# Patient Record
Sex: Male | Born: 1945 | Race: White | Hispanic: No | Smoking: Never smoker
Health system: Southern US, Community
[De-identification: ages and names within clinical notes are randomized; demographics above are authoritative.]

## PROBLEM LIST (undated history)

## (undated) DIAGNOSIS — M545 Low back pain, unspecified: Secondary | ICD-10-CM

## (undated) DIAGNOSIS — I350 Nonrheumatic aortic (valve) stenosis: Secondary | ICD-10-CM

## (undated) DIAGNOSIS — E119 Type 2 diabetes mellitus without complications: Secondary | ICD-10-CM

## (undated) DIAGNOSIS — Z9861 Coronary angioplasty status: Secondary | ICD-10-CM

## (undated) DIAGNOSIS — I10 Essential (primary) hypertension: Secondary | ICD-10-CM

## (undated) DIAGNOSIS — G8929 Other chronic pain: Secondary | ICD-10-CM

## (undated) DIAGNOSIS — M1990 Unspecified osteoarthritis, unspecified site: Secondary | ICD-10-CM

## (undated) DIAGNOSIS — I340 Nonrheumatic mitral (valve) insufficiency: Secondary | ICD-10-CM

## (undated) DIAGNOSIS — J61 Pneumoconiosis due to asbestos and other mineral fibers: Secondary | ICD-10-CM

## (undated) DIAGNOSIS — I219 Acute myocardial infarction, unspecified: Secondary | ICD-10-CM

## (undated) DIAGNOSIS — E7800 Pure hypercholesterolemia, unspecified: Secondary | ICD-10-CM

## (undated) DIAGNOSIS — R011 Cardiac murmur, unspecified: Secondary | ICD-10-CM

## (undated) DIAGNOSIS — I2510 Atherosclerotic heart disease of native coronary artery without angina pectoris: Secondary | ICD-10-CM

## (undated) DIAGNOSIS — F419 Anxiety disorder, unspecified: Secondary | ICD-10-CM

## (undated) DIAGNOSIS — J45909 Unspecified asthma, uncomplicated: Secondary | ICD-10-CM

## (undated) DIAGNOSIS — C61 Malignant neoplasm of prostate: Secondary | ICD-10-CM

## (undated) HISTORY — PX: PROSTATE BIOPSY: SHX241

## (undated) HISTORY — PX: LAPAROSCOPIC CHOLECYSTECTOMY: SUR755

## (undated) HISTORY — PX: ORIF SHOULDER FRACTURE: SHX5035

## (undated) HISTORY — PX: BACK SURGERY: SHX140

## (undated) HISTORY — PX: HEMORRHOID BANDING: SHX5850

## (undated) HISTORY — PX: FOREARM FRACTURE SURGERY: SHX649

## (undated) HISTORY — PX: POSTERIOR FUSION LUMBAR SPINE: SUR632

## (undated) HISTORY — PX: FRACTURE SURGERY: SHX138

---

## 2005-12-23 HISTORY — PX: CORONARY ARTERY BYPASS GRAFT: SHX141

## 2018-03-28 DIAGNOSIS — I2510 Atherosclerotic heart disease of native coronary artery without angina pectoris: Secondary | ICD-10-CM

## 2018-03-28 DIAGNOSIS — R079 Chest pain, unspecified: Secondary | ICD-10-CM | POA: Diagnosis not present

## 2018-03-28 DIAGNOSIS — I249 Acute ischemic heart disease, unspecified: Secondary | ICD-10-CM

## 2018-03-29 DIAGNOSIS — R079 Chest pain, unspecified: Secondary | ICD-10-CM | POA: Diagnosis not present

## 2018-03-29 DIAGNOSIS — I249 Acute ischemic heart disease, unspecified: Secondary | ICD-10-CM | POA: Diagnosis not present

## 2018-03-29 DIAGNOSIS — I2510 Atherosclerotic heart disease of native coronary artery without angina pectoris: Secondary | ICD-10-CM | POA: Diagnosis not present

## 2018-03-30 ENCOUNTER — Inpatient Hospital Stay
Admission: AD | Admit: 2018-03-30 | Payer: Self-pay | Source: Other Acute Inpatient Hospital | Admitting: Cardiovascular Disease

## 2018-03-30 DIAGNOSIS — R079 Chest pain, unspecified: Secondary | ICD-10-CM | POA: Diagnosis not present

## 2018-03-30 DIAGNOSIS — I2510 Atherosclerotic heart disease of native coronary artery without angina pectoris: Secondary | ICD-10-CM | POA: Diagnosis not present

## 2018-03-30 DIAGNOSIS — I249 Acute ischemic heart disease, unspecified: Secondary | ICD-10-CM | POA: Diagnosis not present

## 2018-03-31 ENCOUNTER — Encounter (HOSPITAL_COMMUNITY): Admission: AD | Payer: Self-pay | Source: Ambulatory Visit | Attending: Cardiovascular Disease

## 2018-03-31 ENCOUNTER — Inpatient Hospital Stay (HOSPITAL_COMMUNITY)
Admission: AD | Admit: 2018-03-31 | Discharge: 2018-04-01 | DRG: 247 | Payer: Medicare Other | Source: Ambulatory Visit | Attending: Cardiovascular Disease | Admitting: Cardiovascular Disease

## 2018-03-31 ENCOUNTER — Other Ambulatory Visit: Payer: Self-pay

## 2018-03-31 ENCOUNTER — Encounter (HOSPITAL_COMMUNITY): Payer: Self-pay | Admitting: Cardiology

## 2018-03-31 DIAGNOSIS — I25110 Atherosclerotic heart disease of native coronary artery with unstable angina pectoris: Principal | ICD-10-CM

## 2018-03-31 DIAGNOSIS — I2510 Atherosclerotic heart disease of native coronary artery without angina pectoris: Secondary | ICD-10-CM

## 2018-03-31 DIAGNOSIS — Z8249 Family history of ischemic heart disease and other diseases of the circulatory system: Secondary | ICD-10-CM

## 2018-03-31 DIAGNOSIS — Z951 Presence of aortocoronary bypass graft: Secondary | ICD-10-CM

## 2018-03-31 DIAGNOSIS — D649 Anemia, unspecified: Secondary | ICD-10-CM | POA: Diagnosis present

## 2018-03-31 DIAGNOSIS — Z9861 Coronary angioplasty status: Secondary | ICD-10-CM

## 2018-03-31 DIAGNOSIS — I200 Unstable angina: Secondary | ICD-10-CM | POA: Diagnosis not present

## 2018-03-31 DIAGNOSIS — R9439 Abnormal result of other cardiovascular function study: Secondary | ICD-10-CM

## 2018-03-31 DIAGNOSIS — E785 Hyperlipidemia, unspecified: Secondary | ICD-10-CM | POA: Diagnosis present

## 2018-03-31 DIAGNOSIS — Z923 Personal history of irradiation: Secondary | ICD-10-CM | POA: Diagnosis not present

## 2018-03-31 DIAGNOSIS — C61 Malignant neoplasm of prostate: Secondary | ICD-10-CM | POA: Diagnosis present

## 2018-03-31 DIAGNOSIS — Z8546 Personal history of malignant neoplasm of prostate: Secondary | ICD-10-CM

## 2018-03-31 DIAGNOSIS — E119 Type 2 diabetes mellitus without complications: Secondary | ICD-10-CM | POA: Diagnosis present

## 2018-03-31 DIAGNOSIS — Z955 Presence of coronary angioplasty implant and graft: Secondary | ICD-10-CM | POA: Diagnosis not present

## 2018-03-31 HISTORY — DX: Coronary angioplasty status: Z98.61

## 2018-03-31 HISTORY — DX: Cardiac murmur, unspecified: R01.1

## 2018-03-31 HISTORY — DX: Unspecified osteoarthritis, unspecified site: M19.90

## 2018-03-31 HISTORY — PX: LEFT HEART CATH AND CORONARY ANGIOGRAPHY: CATH118249

## 2018-03-31 HISTORY — DX: Low back pain, unspecified: M54.50

## 2018-03-31 HISTORY — DX: Other chronic pain: G89.29

## 2018-03-31 HISTORY — DX: Pure hypercholesterolemia, unspecified: E78.00

## 2018-03-31 HISTORY — DX: Pneumoconiosis due to asbestos and other mineral fibers: J61

## 2018-03-31 HISTORY — DX: Essential (primary) hypertension: I10

## 2018-03-31 HISTORY — DX: Anxiety disorder, unspecified: F41.9

## 2018-03-31 HISTORY — DX: Low back pain: M54.5

## 2018-03-31 HISTORY — DX: Atherosclerotic heart disease of native coronary artery without angina pectoris: I25.10

## 2018-03-31 HISTORY — PX: CORONARY STENT INTERVENTION: CATH118234

## 2018-03-31 HISTORY — DX: Acute myocardial infarction, unspecified: I21.9

## 2018-03-31 HISTORY — DX: Unspecified asthma, uncomplicated: J45.909

## 2018-03-31 HISTORY — DX: Malignant neoplasm of prostate: C61

## 2018-03-31 HISTORY — DX: Type 2 diabetes mellitus without complications: E11.9

## 2018-03-31 HISTORY — PX: CORONARY ANGIOPLASTY WITH STENT PLACEMENT: SHX49

## 2018-03-31 LAB — GLUCOSE, CAPILLARY
GLUCOSE-CAPILLARY: 230 mg/dL — AB (ref 65–99)
GLUCOSE-CAPILLARY: 182 mg/dL — AB (ref 65–99)
GLUCOSE-CAPILLARY: 318 mg/dL — AB (ref 65–99)

## 2018-03-31 LAB — TROPONIN I

## 2018-03-31 LAB — MRSA PCR SCREENING: MRSA by PCR: NEGATIVE

## 2018-03-31 LAB — POCT ACTIVATED CLOTTING TIME: Activated Clotting Time: 362 seconds

## 2018-03-31 SURGERY — LEFT HEART CATH AND CORONARY ANGIOGRAPHY
Anesthesia: LOCAL

## 2018-03-31 MED ORDER — NITROGLYCERIN 1 MG/10 ML FOR IR/CATH LAB
INTRA_ARTERIAL | Status: DC | PRN
  Administered 2018-03-31 (×2): 200 ug via INTRACORONARY
  Administered 2018-03-31: 100 ug via INTRACORONARY

## 2018-03-31 MED ORDER — MIDAZOLAM HCL 2 MG/2ML IJ SOLN
INTRAMUSCULAR | Status: DC | PRN
  Administered 2018-03-31 (×2): 1 mg via INTRAVENOUS
  Administered 2018-03-31: 2 mg via INTRAVENOUS

## 2018-03-31 MED ORDER — BIVALIRUDIN TRIFLUOROACETATE 250 MG IV SOLR
INTRAVENOUS | Status: AC
  Filled 2018-03-31: qty 250

## 2018-03-31 MED ORDER — SODIUM CHLORIDE 0.9% FLUSH: 3.0000 mL | Freq: Two times a day (BID) | INTRAVENOUS | Status: DC

## 2018-03-31 MED ORDER — BIVALIRUDIN BOLUS VIA INFUSION - CUPID
INTRAVENOUS | Status: DC | PRN
  Administered 2018-03-31: 66.675 mg via INTRAVENOUS

## 2018-03-31 MED ORDER — DIAZEPAM 5 MG PO TABS: 5.0000 mg | ORAL_TABLET | Freq: Four times a day (QID) | ORAL | Status: DC | PRN

## 2018-03-31 MED ORDER — SODIUM CHLORIDE 0.9 % IV SOLN
INTRAVENOUS | Status: AC | PRN
  Administered 2018-03-31: 125 mL/h via INTRAVENOUS

## 2018-03-31 MED ORDER — IOPAMIDOL (ISOVUE-370) INJECTION 76%
INTRAVENOUS | Status: DC | PRN
  Administered 2018-03-31: 125 mL via INTRA_ARTERIAL

## 2018-03-31 MED ORDER — FENTANYL CITRATE (PF) 100 MCG/2ML IJ SOLN
INTRAMUSCULAR | Status: AC
  Filled 2018-03-31: qty 2

## 2018-03-31 MED ORDER — ATORVASTATIN CALCIUM 80 MG PO TABS
80.0000 mg | ORAL_TABLET | Freq: Every day | ORAL | Status: DC
  Administered 2018-03-31: 19:00:00 80 mg via ORAL
  Filled 2018-03-31: qty 1

## 2018-03-31 MED ORDER — MIDAZOLAM HCL 2 MG/2ML IJ SOLN
INTRAMUSCULAR | Status: AC
  Filled 2018-03-31: qty 2

## 2018-03-31 MED ORDER — IOPAMIDOL (ISOVUE-370) INJECTION 76%
INTRAVENOUS | Status: AC
  Filled 2018-03-31: qty 125

## 2018-03-31 MED ORDER — TICAGRELOR 90 MG PO TABS
ORAL_TABLET | ORAL | Status: DC | PRN
  Administered 2018-03-31: 180 mg via ORAL

## 2018-03-31 MED ORDER — HEPARIN (PORCINE) IN NACL 2-0.9 UNIT/ML-% IJ SOLN
INTRAMUSCULAR | Status: AC
  Filled 2018-03-31: qty 1500

## 2018-03-31 MED ORDER — HYDRALAZINE HCL 20 MG/ML IJ SOLN: 5.0000 mg | INTRAMUSCULAR | Status: AC | PRN

## 2018-03-31 MED ORDER — IOHEXOL 350 MG/ML SOLN
INTRAVENOUS | Status: DC | PRN
  Administered 2018-03-31: 180 mL via INTRA_ARTERIAL

## 2018-03-31 MED ORDER — SODIUM CHLORIDE 0.9 % IV SOLN
INTRAVENOUS | Status: DC
  Administered 2018-03-31 (×2): via INTRAVENOUS

## 2018-03-31 MED ORDER — SODIUM CHLORIDE 0.9 % IV SOLN
INTRAVENOUS | Status: AC | PRN
  Administered 2018-03-31 (×2): 1.75 mg/kg/h via INTRAVENOUS

## 2018-03-31 MED ORDER — FENTANYL CITRATE (PF) 100 MCG/2ML IJ SOLN
50.0000 ug | Freq: Once | INTRAMUSCULAR | Status: AC
  Administered 2018-03-31: 50 ug via INTRAVENOUS

## 2018-03-31 MED ORDER — ONDANSETRON HCL 4 MG/2ML IJ SOLN: 4.0000 mg | Freq: Four times a day (QID) | INTRAMUSCULAR | Status: DC | PRN

## 2018-03-31 MED ORDER — LIDOCAINE HCL (PF) 1 % IJ SOLN
INTRAMUSCULAR | Status: DC | PRN
  Administered 2018-03-31: 20 mL

## 2018-03-31 MED ORDER — ASPIRIN 81 MG PO CHEW
81.0000 mg | CHEWABLE_TABLET | Freq: Every day | ORAL | Status: DC
  Administered 2018-03-31 – 2018-04-01 (×2): 81 mg via ORAL
  Filled 2018-03-31 (×2): qty 1

## 2018-03-31 MED ORDER — ZOLPIDEM TARTRATE 5 MG PO TABS: 5.0000 mg | ORAL_TABLET | Freq: Every evening | ORAL | Status: DC | PRN

## 2018-03-31 MED ORDER — ISOSORBIDE MONONITRATE ER 30 MG PO TB24
30.0000 mg | ORAL_TABLET | Freq: Every day | ORAL | Status: DC
  Administered 2018-03-31 – 2018-04-01 (×2): 30 mg via ORAL
  Filled 2018-03-31 (×2): qty 1

## 2018-03-31 MED ORDER — HEPARIN (PORCINE) IN NACL 2-0.9 UNIT/ML-% IJ SOLN
INTRAMUSCULAR | Status: AC | PRN
  Administered 2018-03-31 (×2): 500 mL via INTRA_ARTERIAL

## 2018-03-31 MED ORDER — TICAGRELOR 90 MG PO TABS
ORAL_TABLET | ORAL | Status: AC
  Filled 2018-03-31: qty 2

## 2018-03-31 MED ORDER — SODIUM CHLORIDE 0.9 % IV SOLN: 250.0000 mL | INTRAVENOUS | Status: DC | PRN

## 2018-03-31 MED ORDER — ACETAMINOPHEN 325 MG PO TABS
650.0000 mg | ORAL_TABLET | ORAL | Status: DC | PRN
  Administered 2018-03-31: 23:00:00 650 mg via ORAL
  Filled 2018-03-31: qty 2

## 2018-03-31 MED ORDER — LABETALOL HCL 5 MG/ML IV SOLN: 10.0000 mg | INTRAVENOUS | Status: AC | PRN

## 2018-03-31 MED ORDER — METOPROLOL TARTRATE 12.5 MG HALF TABLET
12.5000 mg | ORAL_TABLET | Freq: Two times a day (BID) | ORAL | Status: DC
  Administered 2018-03-31 – 2018-04-01 (×2): 12.5 mg via ORAL
  Filled 2018-03-31 (×2): qty 1

## 2018-03-31 MED ORDER — SODIUM CHLORIDE 0.9% FLUSH: 3.0000 mL | INTRAVENOUS | Status: DC | PRN

## 2018-03-31 MED ORDER — NITROGLYCERIN 1 MG/10 ML FOR IR/CATH LAB
INTRA_ARTERIAL | Status: AC
  Filled 2018-03-31: qty 10

## 2018-03-31 MED ORDER — LIDOCAINE HCL (PF) 1 % IJ SOLN
INTRAMUSCULAR | Status: AC
  Filled 2018-03-31: qty 30

## 2018-03-31 MED ORDER — FENTANYL CITRATE (PF) 100 MCG/2ML IJ SOLN
INTRAMUSCULAR | Status: DC | PRN
  Administered 2018-03-31 (×3): 25 ug via INTRAVENOUS

## 2018-03-31 MED ORDER — TICAGRELOR 90 MG PO TABS
90.0000 mg | ORAL_TABLET | Freq: Two times a day (BID) | ORAL | Status: DC
  Administered 2018-03-31 – 2018-04-01 (×2): 90 mg via ORAL
  Filled 2018-03-31 (×2): qty 1

## 2018-03-31 SURGICAL SUPPLY — 25 items
BALLN SAPPHIRE 3.0X12 (BALLOONS) ×2
BALLN SAPPHIRE ~~LOC~~ 3.5X15 (BALLOONS) ×2 IMPLANT
BALLN WOLVERINE 2.00X10 (BALLOONS) ×2
BALLN WOLVERINE 2.00X6 (BALLOONS) ×2
BALLN WOLVERINE 2.50X6 (BALLOONS) ×2
BALLN ~~LOC~~ EMERGE MR 4.0X8 (BALLOONS) ×2
BALLOON SAPPHIRE 3.0X12 (BALLOONS) ×1 IMPLANT
BALLOON WOLVERINE 2.00X10 (BALLOONS) ×1 IMPLANT
BALLOON WOLVERINE 2.00X6 (BALLOONS) ×1 IMPLANT
BALLOON WOLVERINE 2.50X6 (BALLOONS) ×1 IMPLANT
BALLOON ~~LOC~~ EMERGE MR 4.0X8 (BALLOONS) ×1 IMPLANT
CATH INFINITI 5FR MULTPACK ANG (CATHETERS) ×2 IMPLANT
CATH VISTA GUIDE 6FR XB4 (CATHETERS) ×2 IMPLANT
ELECT DEFIB PAD ADLT CADENCE (PAD) ×2 IMPLANT
KIT ENCORE 26 ADVANTAGE (KITS) ×2 IMPLANT
KIT HEART LEFT (KITS) ×2 IMPLANT
PACK CARDIAC CATHETERIZATION (CUSTOM PROCEDURE TRAY) ×2 IMPLANT
SHEATH AVANTI 11CM 5FR (SHEATH) ×2 IMPLANT
SHEATH AVANTI 11CM 6FR (SHEATH) ×2 IMPLANT
STENT RESOLUTE ONYX 3.0X30 (Permanent Stent) ×2 IMPLANT
SYR MEDRAD MARK V 150ML (SYRINGE) ×2 IMPLANT
TRANSDUCER W/STOPCOCK (MISCELLANEOUS) ×2 IMPLANT
TUBING CIL FLEX 10 FLL-RA (TUBING) ×2 IMPLANT
WIRE EMERALD 3MM-J .035X150CM (WIRE) ×2 IMPLANT
WIRE PT2 MS 185 (WIRE) ×2 IMPLANT

## 2018-03-31 NOTE — Progress Notes (Signed)
46F sheath removed by Burlene Arnt RTR.  Sheath removed from right femoral artery intact. 20 min manual pressure applied Site level 0.  Dressed with 4x4 and Tegaderm dressing applied.  Post instructions given.  Bed rest begins at 16:15 hrs.

## 2018-03-31 NOTE — H&P (Addendum)
Cardiology Admission History and Physical:   Patient ID: Miguel Garcia; MRN: [id number]; DOB: Feb 01, 1946   Admission date: 03/31/2018  Primary Care Provider: Patient, No Pcp Per Primary Cardiologist: New (Dr. Claiborne Billings)  Chief Complaint:  Unstable Angina and Abnormal Stress Test  Patient Profile:   Miguel Garcia is a 72 y.o. male prisoner, with a history of CAD s/p remote coronary stenting and CABG 14 years ago, transferred from [facility] for unstable angina and abnormal NST.   History of Present Illness:   [name] has been incarcerated for 11 years. Prior to incarceration, he was diagnosed with CAD and required stenting. Shortly after PCI, he had stent occlusion and required CABG (grafts unknown). Since CABG, he required additional stents. While incarcerated, he has been seen by cardiologists in [facility].  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 Pain improved with SL NTG but returned, prompting pt to go to [facility] for further evaluation. Cardiac enzymes were cycled but negative. NST was performed and abnormal showing anterior ischemia. Subsequently, pt was transferred to [facility] for definitive LHC. He is currently CP free. No dyspnea.    Past Medical History:  Diagnosis Date  . CAD (coronary artery disease)   . Diabetes ([place])   . Hx of CABG      The histories are not reviewed yet. Please review them in the "History" navigator section and refresh this [place].   Medications Prior to Admission: Prior to Admission medications   Not on File     Allergies:   Allergies not on file  Social History:   Social History   Socioeconomic History  . Marital status: Divorced    Spouse name: Not on file  . Number of children: Not on file  . Years of education: Not on file  . Highest education level: Not on file  Occupational History  . Not on file  Social Needs  . Financial resource strain: Not on file  . Food insecurity:    Worry: Not on file      Inability: Not on file  . Transportation needs:    Medical: Not on file    Non-medical: Not on file  Tobacco Use  . Smoking status: Not on file  Substance and Sexual Activity  . Alcohol use: Not on file  . Drug use: Not on file  . Sexual activity: Not on file  Lifestyle  . Physical activity:    Days per week: Not on file    Minutes per session: Not on file  . Stress: Not on file  Relationships  . Social connections:    Talks on phone: Not on file    Gets together: Not on file    Attends religious service: Not on file    Active member of club or organization: Not on file    Attends meetings of clubs or organizations: Not on file    Relationship status: Not on file  . Intimate partner violence:    Fear of current or ex partner: Not on file    Emotionally abused: Not on file    Physically abused: Not on file    Forced sexual activity: Not on file  Other Topics Concern  . Not on file  Social History Narrative  . Not on file    Family History:   The patient's family history includes Hypertension in his mother.    ROS:  Please see the history of present illness.  All other ROS reviewed and negative.  Physical Exam/Data:  There were no vitals filed for this visit. No intake or output data in the 24 hours ending 03/31/18 1025 There were no vitals filed for this visit. There is no height or weight on file to calculate BMI.  General:  Well nourished, well developed, in no acute distress HEENT: normal Lymph: no adenopathy Neck: no JVD Endocrine:  No thryomegaly Vascular: No carotid bruits; FA pulses 2+ bilaterally without bruits  Cardiac:  normal S1, S2; RRR; 1/6 murmur Lungs:  clear to auscultation bilaterally, no wheezing, rhonchi or rales  Abd: soft, nontender, no hepatomegaly  Ext: noedema Musculoskeletal:  No deformities, BUE and BLE strength normal and equal Skin: warm and dry  Neuro:  CNs 2-12 intact, no focal abnormalities noted Psych:  Normal affect     EKG:  The ECG that was done at outside hospital was personally reviewed and demonstrates NSR with inferolateral ST abnormalities   Relevant CV Studies: [facility] 03/30/18 pending   Laboratory Data:  ChemistryNo results for input(s): NA, K, CL, CO2, GLUCOSE, BUN, CREATININE, CALCIUM, GFRNONAA, GFRAA, ANIONGAP in the last 168 hours.  No results for input(s): PROT, ALBUMIN, AST, ALT, ALKPHOS, BILITOT in the last 168 hours. HematologyNo results for input(s): WBC, RBC, HGB, HCT, MCV, MCH, MCHC, RDW, PLT in the last 168 hours. Cardiac EnzymesNo results for input(s): TROPONINI in the last 168 hours. No results for input(s): TROPIPOC in the last 168 hours.  BNPNo results for input(s): BNP, PROBNP in the last 168 hours.  DDimer No results for input(s): DDIMER in the last 168 hours.  Radiology/Studies:  No results found.  Assessment and Plan:   1. Unstable Angina: established h/o CAD s/p remote CABG at [facility] 14 years ago. Cardiac enzymes negative at outside hospital but NST abnormal with anterior ischemia. Currently CP free. Plan on definitive LHC +/- PCI. I have reviewed the risks, indications, and alternatives to cardiac catheterization and possible angioplasty/stenting with the patient. Risks include but are not limited to bleeding, infection, vascular injury, stroke, myocardial infection, arrhythmia, kidney injury, radiation-related injury in the case of prolonged fluoroscopy use, emergency cardiac surgery, and death. The patient understands the risks of serious complication is low (<2%).    Severity of Illness: The appropriate patient status for this patient is INPATIENT. Inpatient status is judged to be reasonable and necessary in order to provide the required intensity of service to ensure the patient's safety. The patient's presenting symptoms, physical exam findings, and initial radiographic and laboratory data in the context of their chronic comorbidities is felt to place them at  high risk for further clinical deterioration. Furthermore, it is not anticipated that the patient will be medically stable for discharge from the hospital within 2 midnights of admission. The following factors support the patient status of inpatient.   " The patient's presenting symptoms include unstable angina. " The worrisome physical exam findings include 1/6 murmur. " The initial radiographic and laboratory data are worrisome because of abnormal stress test. " The chronic co-morbidities include CAD and diabetes.   * I certify that at the point of admission it is my clinical judgment that the patient will require inpatient hospital care spanning beyond 2 midnights from the point of admission due to high intensity of service, high risk for further deterioration and high frequency of surveillance required.*    For questions or updates, please contact [place] Please consult [email or web address] for contact info under Cardiology/STEMI.    Signed, Lyda Jester, PA-C  03/31/2018 10:25 AM  Patient seen and examined. Agree with assessment and plan.  Mr. Zai Chmiel is a 72 year old gentleman who is currently residing at [facility].  He has been incarcerated for 11 years.  He has a history of CAD and underwent CABG revascularization surgery at [facility] approximate 14 years ago.  I do not know the specifics of his grafts.  However, 46 months prior to undergoing surgery stents were placed, which apparently had occluded.  He states he has had several additional stents placed since.  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  The chest pain persisted and ultimately required 4 nitroglycerin for resolution.  He was evaluated at [facility].  Troponins are negative for MI.  A nuclear perfusion study is shown reversible anterior ischemia.  He was transferred today for definitive cardiac catheterization.   Patient has a history of prostate CA and has received radiation treatment.  I have reviewed the risks, indications, and alternatives to cardiac catheterization, possible angioplasty, and stenting with the patient. Risks include but are not limited to bleeding, infection, vascular injury, stroke, myocardial infection, arrhythmia, kidney injury, radiation-related injury in the case of prolonged fluoroscopy use, emergency cardiac surgery, and death. The patient understands the risks of serious complication is 1-2 in 4585 with diagnostic cardiac cath and 1-2% or less with angioplasty/stenting.  Presently the patient is pain-free.  Plan catheterization and possible coronary intervention.   Troy Sine, MD, [facility] 03/31/2018 10:27 AM

## 2018-03-31 NOTE — Interval H&P Note (Signed)
Cath Lab Visit (complete for each Cath Lab visit)  Clinical Evaluation Leading to the Procedure:   ACS: No.  Non-ACS:    Anginal Classification: CCS III  Anti-ischemic medical therapy: Maximal Therapy (2 or more classes of medications)  Non-Invasive Test Results: High-risk stress test findings: cardiac mortality >3%/year  Prior CABG: Previous CABG      History and Physical Interval Note:  03/31/2018 10:31 AM  Miguel Garcia  has presented today for surgery, with the diagnosis of cp  The various methods of treatment have been discussed with the patient and family. After consideration of risks, benefits and other options for treatment, the patient has consented to  Procedure(s): LEFT HEART CATH AND CORONARY ANGIOGRAPHY (N/A) as a surgical intervention .  The patient's history has been reviewed, patient examined, no change in status, stable for surgery.  I have reviewed the patient's chart and labs.  Questions were answered to the patient's satisfaction.     Shelva Majestic

## 2018-04-01 ENCOUNTER — Encounter (HOSPITAL_COMMUNITY): Payer: Self-pay | Admitting: Cardiovascular Disease

## 2018-04-01 DIAGNOSIS — I2510 Atherosclerotic heart disease of native coronary artery without angina pectoris: Secondary | ICD-10-CM

## 2018-04-01 DIAGNOSIS — E785 Hyperlipidemia, unspecified: Secondary | ICD-10-CM | POA: Diagnosis present

## 2018-04-01 DIAGNOSIS — I200 Unstable angina: Secondary | ICD-10-CM

## 2018-04-01 DIAGNOSIS — Z951 Presence of aortocoronary bypass graft: Secondary | ICD-10-CM

## 2018-04-01 DIAGNOSIS — D649 Anemia, unspecified: Secondary | ICD-10-CM | POA: Diagnosis present

## 2018-04-01 DIAGNOSIS — Z9861 Coronary angioplasty status: Secondary | ICD-10-CM

## 2018-04-01 DIAGNOSIS — Z8546 Personal history of malignant neoplasm of prostate: Secondary | ICD-10-CM

## 2018-04-01 LAB — BASIC METABOLIC PANEL
Anion gap: 9 (ref 5–15)
BUN: 24 mg/dL — ABNORMAL HIGH (ref 6–20)
CHLORIDE: 101 mmol/L (ref 101–111)
CO2: 23 mmol/L (ref 22–32)
CREATININE: 1.06 mg/dL (ref 0.61–1.24)
Calcium: 8.9 mg/dL (ref 8.9–10.3)
GFR calc non Af Amer: >60 mL/min (ref >60)
Glucose, Bld: 234 mg/dL — ABNORMAL HIGH (ref 65–99)
POTASSIUM: 3.6 mmol/L (ref 3.5–5.1)
Sodium: 133 mmol/L — ABNORMAL LOW (ref 135–145)

## 2018-04-01 LAB — HEPATIC FUNCTION PANEL
ALK PHOS: 62 U/L (ref 38–126)
ALT: 28 U/L (ref 17–63)
AST: 30 U/L (ref 15–41)
Albumin: 3 g/dL — ABNORMAL LOW (ref 3.5–5.0)
BILIRUBIN DIRECT: 0.1 mg/dL (ref 0.1–0.5)
BILIRUBIN INDIRECT: 0.4 mg/dL (ref 0.3–0.9)
BILIRUBIN TOTAL: 0.5 mg/dL (ref 0.3–1.2)
Total Protein: 5.6 g/dL — ABNORMAL LOW (ref 6.5–8.1)

## 2018-04-01 LAB — GLUCOSE, CAPILLARY
Glucose-Capillary: 236 mg/dL — ABNORMAL HIGH (ref 65–99)
Glucose-Capillary: 246 mg/dL — ABNORMAL HIGH (ref 65–99)

## 2018-04-01 LAB — CBC
HEMATOCRIT: 27.3 % — AB (ref 39.0–52.0)
Hemoglobin: 9.1 g/dL — ABNORMAL LOW (ref 13.0–17.0)
MCH: 33 pg (ref 26.0–34.0)
MCHC: 33.3 g/dL (ref 30.0–36.0)
MCV: 98.9 fL (ref 78.0–100.0)
PLATELETS: 163 10*3/uL (ref 150–400)
RBC: 2.76 MIL/uL — AB (ref 4.22–5.81)
RDW: 15.3 % (ref 11.5–15.5)
WBC: 5 10*3/uL (ref 4.0–10.5)

## 2018-04-01 MED ORDER — ANGIOPLASTY BOOK
Freq: Once | Status: AC
  Administered 2018-04-01: 04:00:00
  Filled 2018-04-01: qty 1

## 2018-04-01 MED ORDER — TICAGRELOR 90 MG PO TABS: 90.0000 mg | ORAL_TABLET | Freq: Two times a day (BID) | ORAL | 11 refills | Status: DC

## 2018-04-01 MED ORDER — INSULIN ASPART 100 UNIT/ML ~~LOC~~ SOLN
0.0000 [IU] | Freq: Three times a day (TID) | SUBCUTANEOUS | Status: DC
  Administered 2018-04-01: 5 [IU] via SUBCUTANEOUS

## 2018-04-01 MED ORDER — NITROGLYCERIN 0.4 MG SL SUBL: 0.4000 mg | SUBLINGUAL_TABLET | SUBLINGUAL | Status: DC | PRN

## 2018-04-01 MED ORDER — ACETAMINOPHEN 325 MG PO TABS
650.0000 mg | ORAL_TABLET | ORAL | Status: DC | PRN
Start: 2018-04-01 — End: 2018-08-19

## 2018-04-01 MED FILL — Heparin Sodium (Porcine) 2 Unit/ML in Sodium Chloride 0.9%: INTRAMUSCULAR | Qty: 1000 | Status: AC

## 2018-04-01 NOTE — Progress Notes (Signed)
CARDIAC REHAB PHASE I   PRE:  Rate/Rhythm: 79 SR    BP: sitting 169/97    SaO2:   MODE:  Ambulation: 500 ft   POST:  Rate/Rhythm: 90 SR    BP: sitting 188/76     SaO2:   Pt denied angina however was fatigued after walking 500 ft. Apparently he has not been able to do as much lately with CAD and also prostate ca tx. BP elevated but just got meds. Discussed ed with pt and guard. Apparently there have been instances in the past that the prison MD has not given him his antiplatelet at their discretion. Reiterated to pt and guard that he has to take Brilinta twice a day. Discussed situation with PA and CM to help support him getting his Brilinta, atleast for 30 day (then switch to Plavix if unable to get Brilinta longer). Reviewed diet and ex and NTG. Pt unable to do CRPII due to being in the prison system.  [place], ACSM 04/01/2018 9:37 AM

## 2018-04-01 NOTE — Progress Notes (Addendum)
Progress Note  Patient Name: Miguel Garcia Date of Encounter: 04/01/2018  Primary Cardiologist: Claiborne Billings   Subjective   No chest pain  Inpatient Medications    Scheduled Meds: . aspirin  81 mg Oral Daily  . atorvastatin  80 mg Oral q1800  . insulin aspart  0-15 Units Subcutaneous TID WC  . isosorbide mononitrate  30 mg Oral Daily  . metoprolol tartrate  12.5 mg Oral BID  . sodium chloride flush  3 mL Intravenous Q12H  . ticagrelor  90 mg Oral BID   Continuous Infusions: . sodium chloride 100 mL/hr at 04/01/18 0642  . sodium chloride     PRN Meds: sodium chloride, acetaminophen, diazepam, ondansetron (ZOFRAN) IV, sodium chloride flush, zolpidem   Vital Signs    Vitals:   03/31/18 1956 03/31/18 2000 04/01/18 0346 04/01/18 0752  BP: 132/63 133/75 (!) 161/63 (!) 138/58  Pulse: 85  73 79  Resp: 15 16 17 20   Temp: 98.3 F (36.8 C)  97.7 F (36.5 C) 98.4 F (36.9 C)  TempSrc: Oral  Oral Oral  SpO2: 94% 93% 97%   Weight:   196 lb 3.4 oz (89 kg)   Height:        Intake/Output Summary (Last 24 hours) at 04/01/2018 0857 Last data filed at 04/01/2018 0816 Gross per 24 hour  Intake 3079.08 ml  Output 1880 ml  Net 1199.08 ml   Filed Weights   03/31/18 1000 04/01/18 0346  Weight: 196 lb (88.9 kg) 196 lb 3.4 oz (89 kg)    Telemetry    NSR, PVCs - Personally Reviewed  ECG    NSR, inferior Qs - Personally Reviewed  Physical Exam   GEN: No acute distress.   Neck: No JVD Cardiac: RRR, no murmurs, rubs, or gallops.  Respiratory: Clear to auscultation bilaterally. GI: Soft, nontender, non-distended  MS: No edema; No deformity. RFA without hematoma Neuro:  Nonfocal  Psych: Normal affect   Labs    Chemistry Recent Labs  Lab 04/01/18 0316  NA 133*  K 3.6  CL 101  CO2 23  GLUCOSE 234*  BUN 24*  CREATININE 1.06  CALCIUM 8.9  PROT 5.6*  ALBUMIN 3.0*  AST 30  ALT 28  ALKPHOS 62  BILITOT 0.5  GFRNONAA >60  GFRAA >60  ANIONGAP 9      Hematology Recent Labs  Lab 04/01/18 0316  WBC 5.0  RBC 2.76*  HGB 9.1*  HCT 27.3*  MCV 98.9  MCH 33.0  MCHC 33.3  RDW 15.3  PLT 163    Cardiac Enzymes Recent Labs  Lab 03/31/18 1836  TROPONINI <0.03   No results for input(s): TROPIPOC in the last 168 hours.   BNPNo results for input(s): BNP, PROBNP in the last 168 hours.   DDimer No results for input(s): DDIMER in the last 168 hours.   Radiology    No results found.  Cardiac Studies   See Cath/ PCI note 03/31/18  Patient Profile     72 y.o. male inmate, h/o remote CABG, h/o recent chemo for prostate CA (finished 3 weeks ago) transferred from [facility] 03/31/18 where he presented with chest pain. Nuclear stress was abnormal and he was transferred top [facility] for cath.   Assessment & Plan    1. Unstable angina-   2. CAD- h/o CABG, LM PCI with DES this admission-pt is concerned he will not be able to get Brilinta through the prison system. I'll ask Dr Claiborne Billings if Plavix is an acceptable alternative.  3. Prostate CA- s/p recent chemo- finished 3 weeks ago  4. Anemia- I suspect this is secondary to recent chemo- his Hgb at [facility] was 10. This will need f/u as OP  Plan: Ambulate this am- possible discharge later today- he will need his Med Rec filled out by pharmacy before discharge. He'll need to f/u in the prison system.   For questions or updates, please contact [place] Please consult [email or web address] for contact info under Cardiology/STEMI.   Signed, Kerin Ransom, PA-C  04/01/2018, 8:57 AM  Attending Note:   The patient was seen and examined.  Agree with assessment and plan as noted above.  Changes made to the above note as needed.  Patient seen and independently examined with Kerin Ransom, PA .   We discussed all aspects of the encounter. I agree with the assessment and plan as stated above.  1.   ACS - s/p stenting of his protected LM.     DC today .   Either plavix or Brilinta ( Dr. Claiborne Billings to weigh in on this )  Cont  asa NTG    I have spent a total of 40 minutes with patient reviewing hospital  notes , telemetry, EKGs, labs and examining patient as well as establishing an assessment and plan that was discussed with the patient. > 50% of time was spent in direct patient care.    Thayer Headings, Brooke Bonito., MD, [facility] 04/01/2018, 9:34 AM [address],  [place] Pager [phone]

## 2018-04-01 NOTE — Discharge Summary (Addendum)
Patient ID: Miguel Garcia,  MRN: [id number], DOB/AGE: 05-08-1946 72 y.o.  Admit date: 03/31/2018 Discharge date: 04/01/2018  Primary Care Provider: Patient, No Pcp Per Primary Cardiologist:   Discharge Diagnoses Principal Problem:   Unstable angina [facility]) Active Problems:   Abnormal nuclear stress test   CAD S/P percutaneous coronary angioplasty   Hx of CABG   Dyslipidemia, goal LDL below 70   History of prostate cancer   Anemia   Incarceration   Procedures: Cath/ LM PCI 03/31/18   Hospital Course:  72 y.o. male inmate, h/o remote CABG, h/o recent chemo for prostate CA (finished 3 weeks ago), transferred from [facility] 03/31/18 where he presented 03/28/18 with chest pain c/w [place]. A Nuclear stress was abnormal there and he was transferred 03/31/18 to [facility] for cath. Cath revealed a patent LIMA-LAD with a 30% RCA, patent mCFX stent, patent mLAD stent, 100% pLAD, 95% LM, 60% pCFX. The pt underwent PCI with DES covering the LM and proximal CFX. The pt tolerated the procedure well. His EF was 50% at cath. He was seen the morning of 04/01/18 by Dr Acie Fredrickson and felt to be stable for discharge.   The pt was noted to be anemic- Hgb 9.1 post PCI. The pt recently finished chemotherapy for prostate cancer and we feel his anemia is most likely related to that. The pt denies any h/o GI bleeding or melena. We recommend a f/u CBC in a few days. The pt also expressed concern about receiving Brilinta at prison. Dr Claiborne Billings has recommended at least 30 days of Brilinta. If the pt needed to be switched to Plavix he would need a P2Y12 drawn after 2-3 weeks of Plavix therapy to document the patient was a Plavix responder.    Discharge Vitals:  Blood pressure (!) 145/63, pulse 64, temperature 98.4 F (36.9 C), temperature source Oral, resp. rate (!) 9, height 5\' 8"  (1.727 m), weight 196 lb 3.4 oz (89 kg), SpO2 91 %.    Labs: Results for orders placed or performed during the hospital encounter of 03/31/18 (from the past 24  hour(s))  POCT Activated clotting time     Status: None   Collection Time: 03/31/18 11:33 AM  Result Value Ref Range   Activated Clotting Time 362 seconds  Glucose, capillary     Status: Abnormal   Collection Time: 03/31/18  1:30 PM  Result Value Ref Range   Glucose-Capillary 230 (H) 65 - 99 mg/dL  Glucose, capillary     Status: Abnormal   Collection Time: 03/31/18  4:55 PM  Result Value Ref Range   Glucose-Capillary 182 (H) 65 - 99 mg/dL   Comment 1 Notify RN    Comment 2 Document in Chart   Troponin I (serum)     Status: None   Collection Time: 03/31/18  6:36 PM  Result Value Ref Range   Troponin I <0.03 <0.03 ng/mL  MRSA PCR Screening     Status: None   Collection Time: 03/31/18  9:09 PM  Result Value Ref Range   MRSA by PCR NEGATIVE NEGATIVE  Glucose, capillary     Status: Abnormal   Collection Time: 03/31/18  9:22 PM  Result Value Ref Range   Glucose-Capillary 318 (H) 65 - 99 mg/dL   Comment 1 Notify RN    Comment 2 Document in Chart   Basic metabolic panel     Status: Abnormal   Collection Time: 04/01/18  3:16 AM  Result Value Ref Range   Sodium 133 (  L) 135 - 145 mmol/L   Potassium 3.6 3.5 - 5.1 mmol/L   Chloride 101 101 - 111 mmol/L   CO2 23 22 - 32 mmol/L   Glucose, Bld 234 (H) 65 - 99 mg/dL   BUN 24 (H) 6 - 20 mg/dL   Creatinine, Ser 1.06 0.61 - 1.24 mg/dL   Calcium 8.9 8.9 - 10.3 mg/dL   GFR calc non Af Amer >60 >60 mL/min   GFR calc Af Amer >60 >60 mL/min   Anion gap 9 5 - 15  CBC     Status: Abnormal   Collection Time: 04/01/18  3:16 AM  Result Value Ref Range   WBC 5.0 4.0 - 10.5 K/uL   RBC 2.76 (L) 4.22 - 5.81 MIL/uL   Hemoglobin 9.1 (L) 13.0 - 17.0 g/dL   HCT 27.3 (L) 39.0 - 52.0 %   MCV 98.9 78.0 - 100.0 fL   MCH 33.0 26.0 - 34.0 pg   MCHC 33.3 30.0 - 36.0 g/dL   RDW 15.3 11.5 - 15.5 %   Platelets 163 150 - 400 K/uL  Hepatic function panel     Status: Abnormal   Collection Time: 04/01/18  3:16 AM  Result Value Ref Range   Total Protein 5.6 (L)  6.5 - 8.1 g/dL   Albumin 3.0 (L) 3.5 - 5.0 g/dL   AST 30 15 - 41 U/L   ALT 28 17 - 63 U/L   Alkaline Phosphatase 62 38 - 126 U/L   Total Bilirubin 0.5 0.3 - 1.2 mg/dL   Bilirubin, Direct 0.1 0.1 - 0.5 mg/dL   Indirect Bilirubin 0.4 0.3 - 0.9 mg/dL  Glucose, capillary     Status: Abnormal   Collection Time: 04/01/18  6:47 AM  Result Value Ref Range   Glucose-Capillary 236 (H) 65 - 99 mg/dL   Comment 1 Notify RN    Comment 2 Document in Chart     Disposition:    Discharge Medications:  Allergies as of 04/01/2018   Not on File     Medication List    TAKE these medications   acetaminophen 325 MG tablet Commonly known as:  TYLENOL Take 2 tablets (650 mg total) by mouth every 4 (four) hours as needed for headache or mild pain.   albuterol 108 (90 Base) MCG/ACT inhaler Commonly known as:  PROVENTIL HFA;VENTOLIN HFA Inhale 2 puffs into the lungs 4 (four) times daily as needed for wheezing or shortness of breath.   aspirin EC 81 MG tablet Take 81 mg by mouth daily.   bicalutamide 50 MG tablet Commonly known as:  CASODEX Take 50 mg by mouth daily.   carbamazepine 400 MG 12 hr tablet Commonly known as:  TEGRETOL XR Take 400 mg by mouth at bedtime.   chlorpheniramine 4 MG tablet Commonly known as:  CHLOR-TRIMETON Take 4 mg by mouth 4 (four) times daily as needed for allergies.   docusate sodium 100 MG capsule Commonly known as:  COLACE Take 100 mg by mouth 2 (two) times daily.   doxazosin 4 MG tablet Commonly known as:  CARDURA Take 4 mg by mouth at bedtime.   gemfibrozil 600 MG tablet Commonly known as:  LOPID Take 600 mg by mouth 2 (two) times daily before a meal.   glipiZIDE 10 MG 24 hr tablet Commonly known as:  GLUCOTROL XL Take 10 mg by mouth daily with breakfast.   isosorbide mononitrate 60 MG 24 hr tablet Commonly known as:  IMDUR Take 60 mg by  mouth daily.   metoprolol tartrate 50 MG tablet Commonly known as:  LOPRESSOR Take 50 mg by mouth daily.     nitroGLYCERIN 0.4 MG SL tablet Commonly known as:  NITROSTAT Place 0.4 mg under the tongue every 5 (five) minutes as needed for chest pain.   phenazopyridine 200 MG tablet Commonly known as:  PYRIDIUM Take 200 mg by mouth 3 (three) times daily with meals.   pioglitazone 15 MG tablet Commonly known as:  ACTOS Take 15 mg by mouth daily.   pramoxine-zinc 1-5 % rectal cream Commonly known as:  TRONOLANE Place 28 g rectally every 2 (two) hours as needed for hemorrhoids.   tamsulosin 0.4 MG Caps capsule Commonly known as:  FLOMAX Take 0.4 mg by mouth 2 (two) times daily.   ticagrelor 90 MG Tabs tablet Commonly known as:  BRILINTA Take 1 tablet (90 mg total) by mouth 2 (two) times daily.        Duration of Discharge Encounter: Greater than 30 minutes including physician time.  Angelena Form PA-C 04/01/2018 11:02 AM   Attending Note:   The patient was seen and examined.  Agree with assessment and plan as noted above.  Changes made to the above note as needed.  Patient seen and independently examined with  Kerin Ransom, PA .   We discussed all aspects of the encounter. I agree with the assessment and plan as stated above.  1.   CAD : He is status post stent placement to the protected left main.  Seems to be doing very well.  We will give him 30 days of Brilinta.Marland Kitchen  He made need to be switched to Plavix at that time.  Follow-up with his primary medical doctor he will follow-up with Dr. Claiborne Billings as needed for cardiology issues.   I have spent a total of 40 minutes with patient reviewing hospital  notes , telemetry, EKGs, labs and examining patient as well as establishing an assessment and plan that was discussed with the patient. > 50% of time was spent in direct patient care.    Thayer Headings, Brooke Bonito., MD, [facility] 04/01/2018, 2:51 PM [address]  [place] Pager [phone]

## 2018-04-01 NOTE — Progress Notes (Addendum)
Spoke w Licensed conveyancer at [facility] [phone]. Informed her that patient will be Dc'd w Brilinta and sent with coupon for a free 30 day supply. Reiterated that MD feels it is very important for him to be on it in the very least for 30 days.  Rich Rn asked CM to contact [place] at [phone] to verify that patient will return to [facility]. Per Merrily Pew, patient will return to [facility]. Josh [phone]. Faxed Dc note to Cassandria Santee RN at [phone], discussed Brilinta, and she stated she will get it from local pharmacy to ensure he gets his dose tonight. Discussed coupon card. Provided card to guard at bedside who will give to [facility].

## 2018-04-12 NOTE — Progress Notes (Signed)
Cardiology Office Note   Date:  04/13/2018   ID:  Miguel Garcia, DOB [birth year], MRN [id number]  PCP:  Patient, No Pcp Per  Cardiologist:  Dr. Claiborne Billings Chief Complaint  Patient presents with  . Follow-up    pt c/o SOB on exertion and when laying down at night; dizziness after taking meds in the mornings occasionally--lasts about an hour; leg cramps--has had for months     History of Present Illness: Miguel Garcia is a 72 y.o. male who presents for post hospitalization after being admitted for chest pain with known history of CAD s/p remote coronary stenting and CABG in 2003. He was transferred from [facility] after having abnormal stress test.   Cath revealed a patent LIMA-LAD with a 30% RCA, patent mCFX stent, patent mLAD stent, 100% pLAD, 95% LM, 60% pCFX. The pt underwent PCI with DES covering the LM and proximal CFX.  He was noted to be anemic and it was felt chest pain was related to this. He was to take Brilinta for 30 days (free 30 Rx is provided) at the prison as he is currently incarcerated. It will be possible to change to Plavix if he is unable to continue Brilinta. He would be required to draw labs in 2-3 weeks if he was started on Plavix to document that he was a Plavix responder.    He comes today with officer Clovis Riley  in attendance as he continues to be incarcerated.  He offers no complaints of bleeding, significant chest pain, but he does have some mild shortness of breath.  He states he walks a lot.  He has not been doing any physical labor.  Past Medical History:  Diagnosis Date  . Anxiety   . Arthritis    "hands, lower back,; L4-5;  neck; C1-2" (03/31/2018)  . Asbestosis ([place])    "of my lungs"  . Asthma   . CAD (coronary artery disease)    CABG 14 days ago  . CAD S/P percutaneous coronary angioplasty 03/31/2018   LM PCI  . Chronic lower back pain   . Heart murmur   . High cholesterol   . Hypertension   . Myocardial infarction [facility]) 2006; 2007; 2012  .  Prostate cancer [facility])    "put 3 beamers in my prostate in 11/2017 & radiation completed in 02/2018" (03/31/2018)  . Type II diabetes mellitus ([place])     Past Surgical History:  Procedure Laterality Date  . BACK SURGERY    . CORONARY ANGIOPLASTY WITH STENT PLACEMENT  03/31/2018  . CORONARY ARTERY BYPASS GRAFT  2007  . CORONARY STENT INTERVENTION N/A 03/31/2018   Procedure: CORONARY STENT INTERVENTION;  Surgeon: Troy Sine, MD;  Location: [place] [facility];  Service: Cardiovascular;  Laterality: N/A;  . FOREARM FRACTURE SURGERY Right   . FRACTURE SURGERY    . HEMORRHOID BANDING    . LAPAROSCOPIC CHOLECYSTECTOMY    . LEFT HEART CATH AND CORONARY ANGIOGRAPHY N/A 03/31/2018   Procedure: LEFT HEART CATH AND CORONARY ANGIOGRAPHY;  Surgeon: Troy Sine, MD;  Location: [place] [facility];  Service: Cardiovascular;  Laterality: N/A;  . ORIF SHOULDER FRACTURE Left   . POSTERIOR FUSION LUMBAR SPINE     L4-5  . PROSTATE BIOPSY     "put in 3 [employer] Transponder Implants in 11/2017"     Current Outpatient Medications  Medication Sig Dispense Refill  . acetaminophen (TYLENOL) 325 MG tablet Take 2 tablets (650 mg total) by mouth every 4 (four) hours as  needed for headache or mild pain.    Marland Kitchen albuterol (PROVENTIL HFA;VENTOLIN HFA) 108 (90 Base) MCG/ACT inhaler Inhale 2 puffs into the lungs 4 (four) times daily as needed for wheezing or shortness of breath.    Marland Kitchen aspirin EC 81 MG tablet Take 81 mg by mouth daily.    . bicalutamide (CASODEX) 50 MG tablet Take 50 mg by mouth daily.    Marland Kitchen docusate sodium (COLACE) 100 MG capsule Take 100 mg by mouth 2 (two) times daily.    Marland Kitchen doxazosin (CARDURA) 4 MG tablet Take 4 mg by mouth at bedtime.    Marland Kitchen gemfibrozil (LOPID) 600 MG tablet Take 600 mg by mouth 2 (two) times daily before a meal.    . glipiZIDE (GLUCOTROL XL) 10 MG 24 hr tablet Take 10 mg by mouth daily with breakfast.    . isosorbide mononitrate (IMDUR) 60 MG 24 hr tablet Take 60 mg by mouth daily.    .  metoprolol tartrate (LOPRESSOR) 50 MG tablet Take 50 mg by mouth daily.    . nitroGLYCERIN (NITROSTAT) 0.4 MG SL tablet Place 0.4 mg under the tongue every 5 (five) minutes as needed for chest pain.    . phenazopyridine (PYRIDIUM) 200 MG tablet Take 200 mg by mouth 3 (three) times daily with meals.    . pioglitazone (ACTOS) 15 MG tablet Take 15 mg by mouth daily.    . pramoxine-zinc (TRONOLANE) 1-5 % rectal cream Place 28 g rectally every 2 (two) hours as needed for hemorrhoids.    . tamsulosin (FLOMAX) 0.4 MG CAPS capsule Take 0.4 mg by mouth 2 (two) times daily.    . ticagrelor (BRILINTA) 90 MG TABS tablet Take 1 tablet (90 mg total) by mouth 2 (two) times daily. 60 tablet 11   No current facility-administered medications for this visit.     Allergies:   Patient has no allergy information on record.    Social History:  The patient  reports that he has never smoked. He has quit using smokeless tobacco. His smokeless tobacco use included chew. He reports that he drank alcohol. He reports that he has current or past drug history.   Family History:  The patient's family history includes Hypertension in his mother.    ROS: All other systems are reviewed and negative. Unless otherwise mentioned in H&P    PHYSICAL EXAM: VS:  BP 124/62   Pulse 67   Ht 5\' 8"  (1.727 m)   Wt 192 lb 12.8 oz (87.5 kg)   BMI 29.32 kg/m  , BMI Body mass index is 29.32 kg/m. GEN: Well nourished, well developed, in no acute distress  HEENT: normal  Neck: no JVD, carotid bruits, or masses Cardiac: RRR; no murmurs, rubs, or gallops,no edema  Respiratory:  Clear to auscultation bilaterally, normal work of breathing GI: soft, nontender, nondistended, + BS MS: no deformity or atrophy  Skin: warm and dry, no rash Neuro:  Strength and sensation are intact Psych: euthymic mood, full affect   EKG: Not completed during office visit.    Recent Labs: 04/01/2018: ALT 28; BUN 24; Creatinine, Ser 1.06; Hemoglobin 9.1;  Platelets 163; Potassium 3.6; Sodium 133    Lipid Panel No results found for: CHOL, TRIG, HDL, CHOLHDL, VLDL, LDLCALC, LDLDIRECT    Wt Readings from Last 3 Encounters:  04/13/18 192 lb 12.8 oz (87.5 kg)  04/01/18 196 lb 3.4 oz (89 kg)      Other studies Reviewed: Conclusion     Prox RCA lesion is  30% stenosed.  Mid RCA lesion is 30% stenosed.  Ost LAD to Prox LAD lesion is 100% stenosed.  Mid LM lesion is 95% stenosed.  Ost Cx to Prox Cx lesion is 60% stenosed.  Dist Cx lesion is 50% stenosed.  Ost 3rd Mrg lesion is 40% stenosed.  A stent was successfully placed.  Post intervention, there is a 0% residual stenosis.  Prox Cx lesion is 60% stenosed.  Post intervention, there is a 0% residual stenosis.  Post intervention, there is a 0% residual stenosis.  Post intervention, there is a 0% residual stenosis.  Prox Cx to Mid Cx lesion is 15% stenosed.  The left ventricular ejection fraction is 45-50% by visual estimate.  There is mild left ventricular systolic dysfunction.  1st Diag lesion is 70% stenosed.  Prox LAD to Mid LAD lesion is 10% stenosed.  A stent was successfully placed.   Severe native coronary artery disease with 95% stenosis with mild to moderate calcification in the distal left main; total occlusion of the ostium of the LAD; patent small ramus intermediate vessel; and 60% ostial stenosis extending from the left main stenosis of the circumflex with 60% proximal in-stent renarrowing in the previously placed circumflex stent proximal to a large OM vessel.  There is diffuse 50% stenoses in the distal circumflex beyond the stented segment.;  Large dominant RCA with 30% proximal and mid focal narrowing.  Patent LIMA graft supplying the mid LAD with excellent filling of the LAD up to its ostium and 70% focal stenosis in the mid first diagonal vessel.  Supravalvular aortography did not demonstrate any vein grafts arising from the aorta.  Difficult  but successful complex intervention in the protected left main requiring initial cutting balloon with 2.0 x 6 and 2.5 x 6 mm Wolverine balloons, PTCA and ultimate DES stenting from the left main ostium extending to the proximal circumflex with stent overlap and ending just proximal to the takeoff of a large marginal branch with post stent dilatation up to 3.5 mm in the proximal circumflex and up to 4.1 mm in the left main the entire stenoses being reduced to 0% and brisk TIMI-3 flow.  RECOMMENDATION: DAPT therapy with aspirin/Brilinta must be continued for a minimum of 1 year and would recommend indefinitely.  Medical therapy for concomitant CAD.      ASSESSMENT AND PLAN:  1.  Coronary artery disease: The patient has been asymptomatic since returned to prison.  He has not been doing any exertional physical activity other than walking.  He is tolerating the medications without bleeding or chest discomfort.  He does complain of some mild shortness of breath  I have spoken to the prison medical Department concerning refilling his medications and providing Brilinta.  I spoke with Nurse Nyoka Cowden, who stated that she needed a copy of this office visit note, and written prescriptions for all of his medications which are to be continued.  Fax #4 [facility] [phone].  I have sent a handwritten prescription to the medical facility at [facility] for all of his cardiac medications  The patient is not to do any heavy lifting greater than 20 pounds for a minimum of 2 weeks.  It is medically necessary for this patient to continue Brilinta for minimum of 1 year due to multiple stent placements to his coronary arteries.  A letter has been written with these instructions which accompany him back to the prison,  and given to the officer accompanying him.  2.Hypecholesterolemia: Currently on Lopid.  Will plan follow-up labs in 3 months to assess his  status.  3.  Hypertension: Currently well controlled.  Current medicines are reviewed at length with the patient today.  I have spent greater than 40 minutes with this patient explaining his coronary artery disease, his catheterization results, and speaking with the department of corrections for clarification concerning his medications refills and requirements.  Labs/ tests ordered today include: None  Phill Myron. West Pugh, ANP, AACC   04/13/2018 11:50 AM    [place]. [address] [place], [place] [address] Phone: [phone]; Fax: [phone]

## 2018-04-13 ENCOUNTER — Encounter: Payer: Self-pay | Admitting: Adult Health

## 2018-04-13 ENCOUNTER — Ambulatory Visit (INDEPENDENT_AMBULATORY_CARE_PROVIDER_SITE_OTHER): Admitting: Adult Health

## 2018-04-13 VITALS — BP 124/62 | HR 67 | Ht 68.0 in | Wt 192.8 lb

## 2018-04-13 DIAGNOSIS — E7800 Pure hypercholesterolemia, unspecified: Secondary | ICD-10-CM | POA: Diagnosis not present

## 2018-04-13 DIAGNOSIS — I200 Unstable angina: Secondary | ICD-10-CM | POA: Diagnosis not present

## 2018-04-13 DIAGNOSIS — I10 Essential (primary) hypertension: Secondary | ICD-10-CM | POA: Diagnosis not present

## 2018-04-13 DIAGNOSIS — I5022 Chronic systolic (congestive) heart failure: Secondary | ICD-10-CM

## 2018-04-13 DIAGNOSIS — I2510 Atherosclerotic heart disease of native coronary artery without angina pectoris: Secondary | ICD-10-CM | POA: Diagnosis not present

## 2018-04-13 MED ORDER — NITROGLYCERIN 0.4 MG SL SUBL: 0.4000 mg | SUBLINGUAL_TABLET | SUBLINGUAL | 11 refills | Status: AC | PRN

## 2018-04-13 MED ORDER — TICAGRELOR 90 MG PO TABS: 90.0000 mg | ORAL_TABLET | Freq: Two times a day (BID) | ORAL | 11 refills | Status: DC

## 2018-04-13 MED ORDER — ISOSORBIDE MONONITRATE ER 60 MG PO TB24: 60.0000 mg | ORAL_TABLET | Freq: Every day | ORAL | 11 refills | Status: DC

## 2018-04-13 MED ORDER — METOPROLOL TARTRATE 50 MG PO TABS: 50.0000 mg | ORAL_TABLET | Freq: Every day | ORAL | 11 refills | Status: DC

## 2018-04-13 MED ORDER — ASPIRIN EC 81 MG PO TBEC: 81.0000 mg | DELAYED_RELEASE_TABLET | Freq: Every day | ORAL | 11 refills | Status: DC

## 2018-04-13 NOTE — Patient Instructions (Signed)
Medication Instructions:  NO CHANGES- Your physician recommends that you continue on your current medications as directed. Please refer to the Current Medication list given to you today.  If you need a refill on your cardiac medications before your next appointment, please call your pharmacy.  Labwork: AT NEXT VISIT HERE IN OUR OFFICE AT [facility]  Special Instructions: Pt will need to take Brilinta for at lease 1 year.  Left lifting restriction >20lbs.,then may return to normal duties  Follow-Up: Your physician wants you to follow-up in: [place] should receive a reminder letter in the mail two months in advance. If you do not receive a letter, please call our office 07-2018 to schedule the 09-2018 follow-up appointment.   Thank you for choosing [facility] at [facility]!!

## 2018-08-14 ENCOUNTER — Inpatient Hospital Stay (HOSPITAL_COMMUNITY)
Admission: EM | Admit: 2018-08-14 | Discharge: 2018-08-19 | DRG: 246 | Payer: Medicare Other | Attending: Internal Medicine | Admitting: Internal Medicine

## 2018-08-14 ENCOUNTER — Encounter (HOSPITAL_COMMUNITY): Payer: Self-pay | Admitting: Emergency Medicine

## 2018-08-14 ENCOUNTER — Other Ambulatory Visit: Payer: Self-pay

## 2018-08-14 DIAGNOSIS — I110 Hypertensive heart disease with heart failure: Secondary | ICD-10-CM | POA: Diagnosis present

## 2018-08-14 DIAGNOSIS — E785 Hyperlipidemia, unspecified: Secondary | ICD-10-CM | POA: Diagnosis present

## 2018-08-14 DIAGNOSIS — Z7951 Long term (current) use of inhaled steroids: Secondary | ICD-10-CM

## 2018-08-14 DIAGNOSIS — E876 Hypokalemia: Secondary | ICD-10-CM | POA: Diagnosis present

## 2018-08-14 DIAGNOSIS — I200 Unstable angina: Secondary | ICD-10-CM

## 2018-08-14 DIAGNOSIS — Z8546 Personal history of malignant neoplasm of prostate: Secondary | ICD-10-CM

## 2018-08-14 DIAGNOSIS — Z7984 Long term (current) use of oral hypoglycemic drugs: Secondary | ICD-10-CM

## 2018-08-14 DIAGNOSIS — K5731 Diverticulosis of large intestine without perforation or abscess with bleeding: Secondary | ICD-10-CM | POA: Diagnosis not present

## 2018-08-14 DIAGNOSIS — I10 Essential (primary) hypertension: Secondary | ICD-10-CM | POA: Diagnosis present

## 2018-08-14 DIAGNOSIS — J61 Pneumoconiosis due to asbestos and other mineral fibers: Secondary | ICD-10-CM | POA: Diagnosis present

## 2018-08-14 DIAGNOSIS — Z981 Arthrodesis status: Secondary | ICD-10-CM

## 2018-08-14 DIAGNOSIS — Y838 Other surgical procedures as the cause of abnormal reaction of the patient, or of later complication, without mention of misadventure at the time of the procedure: Secondary | ICD-10-CM | POA: Diagnosis present

## 2018-08-14 DIAGNOSIS — I25110 Atherosclerotic heart disease of native coronary artery with unstable angina pectoris: Secondary | ICD-10-CM | POA: Diagnosis present

## 2018-08-14 DIAGNOSIS — E119 Type 2 diabetes mellitus without complications: Secondary | ICD-10-CM

## 2018-08-14 DIAGNOSIS — N179 Acute kidney failure, unspecified: Secondary | ICD-10-CM | POA: Diagnosis not present

## 2018-08-14 DIAGNOSIS — Z9049 Acquired absence of other specified parts of digestive tract: Secondary | ICD-10-CM

## 2018-08-14 DIAGNOSIS — T82855A Stenosis of coronary artery stent, initial encounter: Principal | ICD-10-CM | POA: Diagnosis present

## 2018-08-14 DIAGNOSIS — Z955 Presence of coronary angioplasty implant and graft: Secondary | ICD-10-CM

## 2018-08-14 DIAGNOSIS — Z951 Presence of aortocoronary bypass graft: Secondary | ICD-10-CM

## 2018-08-14 DIAGNOSIS — Z8249 Family history of ischemic heart disease and other diseases of the circulatory system: Secondary | ICD-10-CM

## 2018-08-14 DIAGNOSIS — I2510 Atherosclerotic heart disease of native coronary artery without angina pectoris: Secondary | ICD-10-CM

## 2018-08-14 DIAGNOSIS — F419 Anxiety disorder, unspecified: Secondary | ICD-10-CM | POA: Diagnosis present

## 2018-08-14 DIAGNOSIS — I252 Old myocardial infarction: Secondary | ICD-10-CM

## 2018-08-14 DIAGNOSIS — Z7982 Long term (current) use of aspirin: Secondary | ICD-10-CM

## 2018-08-14 DIAGNOSIS — Z9861 Coronary angioplasty status: Secondary | ICD-10-CM

## 2018-08-14 DIAGNOSIS — E1151 Type 2 diabetes mellitus with diabetic peripheral angiopathy without gangrene: Secondary | ICD-10-CM | POA: Diagnosis present

## 2018-08-14 DIAGNOSIS — R011 Cardiac murmur, unspecified: Secondary | ICD-10-CM | POA: Diagnosis present

## 2018-08-14 DIAGNOSIS — Z923 Personal history of irradiation: Secondary | ICD-10-CM

## 2018-08-14 DIAGNOSIS — D696 Thrombocytopenia, unspecified: Secondary | ICD-10-CM | POA: Diagnosis present

## 2018-08-14 DIAGNOSIS — D649 Anemia, unspecified: Secondary | ICD-10-CM

## 2018-08-14 DIAGNOSIS — R319 Hematuria, unspecified: Secondary | ICD-10-CM | POA: Diagnosis not present

## 2018-08-14 DIAGNOSIS — Z7902 Long term (current) use of antithrombotics/antiplatelets: Secondary | ICD-10-CM

## 2018-08-14 DIAGNOSIS — K921 Melena: Secondary | ICD-10-CM

## 2018-08-14 DIAGNOSIS — I5032 Chronic diastolic (congestive) heart failure: Secondary | ICD-10-CM | POA: Diagnosis present

## 2018-08-14 DIAGNOSIS — I209 Angina pectoris, unspecified: Secondary | ICD-10-CM | POA: Clinically undetermined

## 2018-08-14 DIAGNOSIS — Z79899 Other long term (current) drug therapy: Secondary | ICD-10-CM

## 2018-08-14 DIAGNOSIS — D62 Acute posthemorrhagic anemia: Secondary | ICD-10-CM | POA: Diagnosis not present

## 2018-08-14 LAB — BASIC METABOLIC PANEL
ANION GAP: 9 (ref 5–15)
BUN: 27 mg/dL — ABNORMAL HIGH (ref 8–23)
CO2: 22 mmol/L (ref 22–32)
Calcium: 9.7 mg/dL (ref 8.9–10.3)
Chloride: 108 mmol/L (ref 98–111)
Creatinine, Ser: 1.24 mg/dL (ref 0.61–1.24)
GFR calc Af Amer: >60 mL/min (ref >60)
GFR, EST NON AFRICAN AMERICAN: 56 mL/min — AB (ref >60)
GLUCOSE: 111 mg/dL — AB (ref 70–99)
Potassium: 4.1 mmol/L (ref 3.5–5.1)
Sodium: 139 mmol/L (ref 135–145)

## 2018-08-14 LAB — CBC
HEMATOCRIT: 35.5 % — AB (ref 39.0–52.0)
HEMOGLOBIN: 11.7 g/dL — AB (ref 13.0–17.0)
MCH: 31.7 pg (ref 26.0–34.0)
MCHC: 33 g/dL (ref 30.0–36.0)
MCV: 96.2 fL (ref 78.0–100.0)
Platelets: 142 10*3/uL — ABNORMAL LOW (ref 150–400)
RBC: 3.69 MIL/uL — ABNORMAL LOW (ref 4.22–5.81)
RDW: 13.9 % (ref 11.5–15.5)
WBC: 4.7 10*3/uL (ref 4.0–10.5)

## 2018-08-14 LAB — I-STAT TROPONIN, ED: Troponin i, poc: 0.05 ng/mL (ref 0.00–0.08)

## 2018-08-14 NOTE — ED Triage Notes (Addendum)
Reports central chest tightness that's been getting worse for the last week.  Extensive cardiac history.  Denies having pain at this time.  Brought from [facility].  CO at the bedside.

## 2018-08-15 ENCOUNTER — Emergency Department (HOSPITAL_COMMUNITY): Payer: Medicare Other

## 2018-08-15 ENCOUNTER — Other Ambulatory Visit: Payer: Self-pay

## 2018-08-15 ENCOUNTER — Observation Stay (HOSPITAL_BASED_OUTPATIENT_CLINIC_OR_DEPARTMENT_OTHER): Payer: Medicare Other

## 2018-08-15 ENCOUNTER — Observation Stay (HOSPITAL_COMMUNITY): Payer: Medicare Other

## 2018-08-15 DIAGNOSIS — I209 Angina pectoris, unspecified: Secondary | ICD-10-CM | POA: Clinically undetermined

## 2018-08-15 DIAGNOSIS — R079 Chest pain, unspecified: Secondary | ICD-10-CM | POA: Diagnosis not present

## 2018-08-15 DIAGNOSIS — N179 Acute kidney failure, unspecified: Secondary | ICD-10-CM | POA: Insufficient documentation

## 2018-08-15 DIAGNOSIS — Z9861 Coronary angioplasty status: Secondary | ICD-10-CM | POA: Diagnosis not present

## 2018-08-15 DIAGNOSIS — I340 Nonrheumatic mitral (valve) insufficiency: Secondary | ICD-10-CM

## 2018-08-15 DIAGNOSIS — I10 Essential (primary) hypertension: Secondary | ICD-10-CM | POA: Diagnosis not present

## 2018-08-15 DIAGNOSIS — C61 Malignant neoplasm of prostate: Secondary | ICD-10-CM | POA: Insufficient documentation

## 2018-08-15 DIAGNOSIS — I2510 Atherosclerotic heart disease of native coronary artery without angina pectoris: Secondary | ICD-10-CM | POA: Diagnosis not present

## 2018-08-15 DIAGNOSIS — I200 Unstable angina: Secondary | ICD-10-CM

## 2018-08-15 DIAGNOSIS — E119 Type 2 diabetes mellitus without complications: Secondary | ICD-10-CM | POA: Diagnosis not present

## 2018-08-15 LAB — GLUCOSE, CAPILLARY
GLUCOSE-CAPILLARY: 107 mg/dL — AB (ref 70–99)
Glucose-Capillary: 75 mg/dL (ref 70–99)
Glucose-Capillary: 126 mg/dL — ABNORMAL HIGH (ref 70–99)
Glucose-Capillary: 119 mg/dL — ABNORMAL HIGH (ref 70–99)

## 2018-08-15 LAB — URINALYSIS, ROUTINE W REFLEX MICROSCOPIC
BACTERIA UA: NONE SEEN
BILIRUBIN URINE: NEGATIVE
Bilirubin Urine: NEGATIVE
Glucose, UA: NEGATIVE mg/dL
Glucose, UA: NEGATIVE mg/dL
HGB URINE DIPSTICK: NEGATIVE
Hgb urine dipstick: NEGATIVE
KETONES UR: NEGATIVE mg/dL
Ketones, ur: NEGATIVE mg/dL
Leukocytes, UA: NEGATIVE
Leukocytes, UA: NEGATIVE
NITRITE: POSITIVE — AB
Nitrite: NEGATIVE
PH: 6 (ref 5.0–8.0)
PROTEIN: NEGATIVE mg/dL
Protein, ur: NEGATIVE mg/dL
Specific Gravity, Urine: 1.023 (ref 1.005–1.030)
Specific Gravity, Urine: 1.025 (ref 1.005–1.030)
pH: 5 (ref 5.0–8.0)

## 2018-08-15 LAB — HEPARIN LEVEL (UNFRACTIONATED): HEPARIN UNFRACTIONATED: 0.45 [IU]/mL (ref 0.30–0.70)

## 2018-08-15 LAB — LIPID PANEL
CHOLESTEROL: 119 mg/dL (ref 0–200)
HDL: 34 mg/dL — AB (ref >40)
LDL CALC: 69 mg/dL (ref 0–99)
TRIGLYCERIDES: 80 mg/dL (ref <150)
Total CHOL/HDL Ratio: 3.5 RATIO
VLDL: 16 mg/dL (ref 0–40)

## 2018-08-15 LAB — CBC
HCT: 31 % — ABNORMAL LOW (ref 39.0–52.0)
Hemoglobin: 10.3 g/dL — ABNORMAL LOW (ref 13.0–17.0)
MCH: 31.5 pg (ref 26.0–34.0)
MCHC: 33.2 g/dL (ref 30.0–36.0)
MCV: 94.8 fL (ref 78.0–100.0)
PLATELETS: 139 10*3/uL — AB (ref 150–400)
RBC: 3.27 MIL/uL — AB (ref 4.22–5.81)
RDW: 13.8 % (ref 11.5–15.5)
WBC: 3.6 10*3/uL — AB (ref 4.0–10.5)

## 2018-08-15 LAB — I-STAT TROPONIN, ED: Troponin i, poc: 0.06 ng/mL (ref 0.00–0.08)

## 2018-08-15 LAB — ECHOCARDIOGRAM COMPLETE
HEIGHTINCHES: 68 in
Weight: 3272 oz

## 2018-08-15 LAB — HEMOGLOBIN A1C
HEMOGLOBIN A1C: 6.1 % — AB (ref 4.8–5.6)
Mean Plasma Glucose: 128.37 mg/dL

## 2018-08-15 LAB — TROPONIN I
Troponin I: <0.03 ng/mL (ref <0.03)
Troponin I: <0.03 ng/mL (ref <0.03)
Troponin I: <0.03 ng/mL (ref <0.03)

## 2018-08-15 LAB — MRSA PCR SCREENING: MRSA by PCR: NEGATIVE

## 2018-08-15 MED ORDER — HEPARIN (PORCINE) IN NACL 100-0.45 UNIT/ML-% IJ SOLN
1200.0000 [IU]/h | INTRAMUSCULAR | Status: DC
  Administered 2018-08-15: 1200 [IU]/h via INTRAVENOUS
  Filled 2018-08-15: qty 250

## 2018-08-15 MED ORDER — ASPIRIN 81 MG PO CHEW
324.0000 mg | CHEWABLE_TABLET | Freq: Once | ORAL | Status: AC
  Administered 2018-08-15: 324 mg via ORAL
  Filled 2018-08-15: qty 4

## 2018-08-15 MED ORDER — GABAPENTIN 600 MG PO TABS
300.0000 mg | ORAL_TABLET | Freq: Two times a day (BID) | ORAL | Status: DC
  Administered 2018-08-15 – 2018-08-19 (×9): 300 mg via ORAL
  Filled 2018-08-15 (×9): qty 1

## 2018-08-15 MED ORDER — ZOLPIDEM TARTRATE 5 MG PO TABS
5.0000 mg | ORAL_TABLET | Freq: Every evening | ORAL | Status: DC | PRN
  Administered 2018-08-15 – 2018-08-16 (×2): 5 mg via ORAL
  Filled 2018-08-15 (×2): qty 1

## 2018-08-15 MED ORDER — MORPHINE SULFATE (PF) 2 MG/ML IV SOLN
2.0000 mg | INTRAVENOUS | Status: DC | PRN
  Administered 2018-08-17: 2 mg via INTRAVENOUS
  Filled 2018-08-15: qty 1

## 2018-08-15 MED ORDER — ALBUTEROL SULFATE (2.5 MG/3ML) 0.083% IN NEBU: 2.5000 mg | INHALATION_SOLUTION | RESPIRATORY_TRACT | Status: DC | PRN

## 2018-08-15 MED ORDER — DOCUSATE SODIUM 100 MG PO CAPS
100.0000 mg | ORAL_CAPSULE | Freq: Two times a day (BID) | ORAL | Status: DC
  Administered 2018-08-15 – 2018-08-19 (×7): 100 mg via ORAL
  Filled 2018-08-15 (×8): qty 1

## 2018-08-15 MED ORDER — ASPIRIN EC 81 MG PO TBEC
81.0000 mg | DELAYED_RELEASE_TABLET | Freq: Every day | ORAL | Status: DC
  Administered 2018-08-15 – 2018-08-17 (×2): 81 mg via ORAL
  Filled 2018-08-15 (×3): qty 1

## 2018-08-15 MED ORDER — TAMSULOSIN HCL 0.4 MG PO CAPS
0.4000 mg | ORAL_CAPSULE | Freq: Two times a day (BID) | ORAL | Status: DC
  Administered 2018-08-15 – 2018-08-19 (×9): 0.4 mg via ORAL
  Filled 2018-08-15 (×9): qty 1

## 2018-08-15 MED ORDER — HYDROCORTISONE 2.5 % RE CREA
TOPICAL_CREAM | RECTAL | Status: DC | PRN
  Filled 2018-08-15: qty 28.35

## 2018-08-15 MED ORDER — BICALUTAMIDE 50 MG PO TABS
50.0000 mg | ORAL_TABLET | Freq: Every day | ORAL | Status: DC
  Administered 2018-08-15 – 2018-08-19 (×5): 50 mg via ORAL
  Filled 2018-08-15 (×7): qty 1

## 2018-08-15 MED ORDER — SODIUM CHLORIDE 0.9 % IV SOLN: INTRAVENOUS | Status: DC

## 2018-08-15 MED ORDER — INSULIN ASPART 100 UNIT/ML ~~LOC~~ SOLN
0.0000 [IU] | Freq: Three times a day (TID) | SUBCUTANEOUS | Status: DC
  Administered 2018-08-15: 1 [IU] via SUBCUTANEOUS
  Administered 2018-08-16: 2 [IU] via SUBCUTANEOUS
  Administered 2018-08-18 – 2018-08-19 (×5): 1 [IU] via SUBCUTANEOUS

## 2018-08-15 MED ORDER — NITROGLYCERIN 0.4 MG SL SUBL: 0.4000 mg | SUBLINGUAL_TABLET | SUBLINGUAL | Status: DC | PRN

## 2018-08-15 MED ORDER — ATORVASTATIN CALCIUM 80 MG PO TABS
80.0000 mg | ORAL_TABLET | Freq: Every day | ORAL | Status: DC
  Administered 2018-08-15 – 2018-08-16 (×2): 80 mg via ORAL
  Filled 2018-08-15 (×2): qty 4

## 2018-08-15 MED ORDER — TICAGRELOR 90 MG PO TABS
90.0000 mg | ORAL_TABLET | Freq: Two times a day (BID) | ORAL | Status: DC
  Administered 2018-08-15 – 2018-08-19 (×9): 90 mg via ORAL
  Filled 2018-08-15 (×9): qty 1

## 2018-08-15 MED ORDER — IOPAMIDOL (ISOVUE-370) INJECTION 76%
100.0000 mL | Freq: Once | INTRAVENOUS | Status: AC | PRN
  Administered 2018-08-15: 100 mL via INTRAVENOUS

## 2018-08-15 MED ORDER — IOPAMIDOL (ISOVUE-370) INJECTION 76%
INTRAVENOUS | Status: AC
  Filled 2018-08-15: qty 100

## 2018-08-15 MED ORDER — HEPARIN BOLUS VIA INFUSION
4000.0000 [IU] | Freq: Once | INTRAVENOUS | Status: AC
  Administered 2018-08-15: 4000 [IU] via INTRAVENOUS
  Filled 2018-08-15: qty 4000

## 2018-08-15 MED ORDER — ONDANSETRON HCL 4 MG/2ML IJ SOLN: 4.0000 mg | Freq: Three times a day (TID) | INTRAMUSCULAR | Status: DC | PRN

## 2018-08-15 MED ORDER — ISOSORBIDE MONONITRATE ER 60 MG PO TB24
60.0000 mg | ORAL_TABLET | Freq: Every day | ORAL | Status: DC
  Administered 2018-08-15 – 2018-08-19 (×5): 60 mg via ORAL
  Filled 2018-08-15 (×5): qty 1

## 2018-08-15 MED ORDER — ALPRAZOLAM 0.25 MG PO TABS: 0.2500 mg | ORAL_TABLET | Freq: Two times a day (BID) | ORAL | Status: DC | PRN

## 2018-08-15 MED ORDER — ACETAMINOPHEN 325 MG PO TABS: 650.0000 mg | ORAL_TABLET | Freq: Four times a day (QID) | ORAL | Status: DC | PRN

## 2018-08-15 MED ORDER — METOPROLOL TARTRATE 50 MG PO TABS
50.0000 mg | ORAL_TABLET | Freq: Every day | ORAL | Status: DC
  Administered 2018-08-15 – 2018-08-19 (×5): 50 mg via ORAL
  Filled 2018-08-15 (×5): qty 1

## 2018-08-15 MED ORDER — HYDRALAZINE HCL 20 MG/ML IJ SOLN: 5.0000 mg | INTRAMUSCULAR | Status: DC | PRN

## 2018-08-15 MED ORDER — HYDROCORTISONE ACE-PRAMOXINE 2.5-1 % RE CREA
1.0000 "application " | TOPICAL_CREAM | RECTAL | Status: DC | PRN
  Filled 2018-08-15: qty 30

## 2018-08-15 MED ORDER — INSULIN ASPART 100 UNIT/ML ~~LOC~~ SOLN: 0.0000 [IU] | Freq: Every day | SUBCUTANEOUS | Status: DC

## 2018-08-15 MED ORDER — PHENAZOPYRIDINE HCL 200 MG PO TABS
200.0000 mg | ORAL_TABLET | Freq: Three times a day (TID) | ORAL | Status: DC
  Administered 2018-08-15 – 2018-08-16 (×5): 200 mg via ORAL
  Filled 2018-08-15 (×6): qty 1

## 2018-08-15 MED ORDER — GEMFIBROZIL 600 MG PO TABS
600.0000 mg | ORAL_TABLET | Freq: Two times a day (BID) | ORAL | Status: DC
  Administered 2018-08-15 – 2018-08-16 (×3): 600 mg via ORAL
  Filled 2018-08-15 (×4): qty 1

## 2018-08-15 NOTE — Progress Notes (Addendum)
Pt is having fresh blood in his urine and stool, paged MD, pt denies pain and vitals stable, heparin paused for a moment until I hear from MD, pharmacy notified as well about holding of heparin  Palma Holter, RN

## 2018-08-15 NOTE — Consult Note (Signed)
Cardiology Consultation:   Patient ID: Miguel Garcia; [id number]; 1946-06-09   Admit date: 08/14/2018 Date of Consult: 08/15/2018  Primary Care Provider: Patient, No Pcp Per Primary Cardiologist: Claiborne Billings   Patient Profile:   Miguel Garcia is a 72 y.o. male with a hx of CAD with CABG in 2003 and subsequent PCI, HTN, HLD, DM who is being seen today for the evaluation of chest pain at the request of Dr. Roxanne Mins.  History of Present Illness:   Miguel Garcia is a 72 y.o. male with a hx of CAD with CABG in 2003 and subsequent PCI, HTN, HLD, DM, asthma,  who is being seen today for the evaluation of chest pain.  The patient has a history of known CAD. He was hospitalized in April 2019 with chest pain. He underwent LHC that showed patent LIMA-LAD with a 30% RCA, patent mCFX stent, patent mLAD stent, 100% pLAD, 95% LM, 60% pCFX. He underwent PCI with DES covering the LM and proximal CFX. He was seen for follow up in clinic on 4/22, at which time he denied chest pain or other complaints. A plan was made to transition him from ticagrelor to clopidogrel.   He presented to the ED this evening with chest pain. He reports that over the past several days he has experienced intermittent chest tightness and dyspnea. He has intermittent sweating but attributes this to his hormonal therapy. These symptoms occur with exertion and improve with rest. He states that he has not been exerting himself significantly and has therefore not experienced any chest discomfort for the past 3 days.   In the ED, SBP 130-150s. HR 60-70s. ECG showed NSR with no acute ischemic changes. Troponin negative x1. He was started on heparin gtt and admitted to the hospitalist service; cardiology was consulted for further recommendations.   Past Medical History:  Diagnosis Date  . Anxiety   . Arthritis    "hands, lower back,; L4-5;  neck; C1-2" (03/31/2018)  . Asbestosis ([place])    "of my lungs"  . Asthma   . CAD (coronary artery  disease)    CABG 14 days ago  . CAD S/P percutaneous coronary angioplasty 03/31/2018   LM PCI  . Chronic lower back pain   . Heart murmur   . High cholesterol   . Hypertension   . Myocardial infarction [facility]) 2006; 2007; 2012  . Prostate cancer [facility])    "put 3 beamers in my prostate in 11/2017 & radiation completed in 02/2018" (03/31/2018)  . Type II diabetes mellitus ([place])     Past Surgical History:  Procedure Laterality Date  . BACK SURGERY    . CORONARY ANGIOPLASTY WITH STENT PLACEMENT  03/31/2018  . CORONARY ARTERY BYPASS GRAFT  2007  . CORONARY STENT INTERVENTION N/A 03/31/2018   Procedure: CORONARY STENT INTERVENTION;  Surgeon: Troy Sine, MD;  Location: [place] [facility];  Service: Cardiovascular;  Laterality: N/A;  . FOREARM FRACTURE SURGERY Right   . FRACTURE SURGERY    . HEMORRHOID BANDING    . LAPAROSCOPIC CHOLECYSTECTOMY    . LEFT HEART CATH AND CORONARY ANGIOGRAPHY N/A 03/31/2018   Procedure: LEFT HEART CATH AND CORONARY ANGIOGRAPHY;  Surgeon: Troy Sine, MD;  Location: [place] [facility];  Service: Cardiovascular;  Laterality: N/A;  . ORIF SHOULDER FRACTURE Left   . POSTERIOR FUSION LUMBAR SPINE     L4-5  . PROSTATE BIOPSY     "put in 3 [employer] Transponder Implants in 11/2017"  Home Medications:  Prior to Admission medications   Medication Sig Start Date End Date Taking? Authorizing Provider  acetaminophen (TYLENOL) 325 MG tablet Take 2 tablets (650 mg total) by mouth every 4 (four) hours as needed for headache or mild pain. 04/01/18   Erlene Quan, PA-C  albuterol (PROVENTIL HFA;VENTOLIN HFA) 108 (90 Base) MCG/ACT inhaler Inhale 2 puffs into the lungs 4 (four) times daily as needed for wheezing or shortness of breath.    [provider]  aspirin EC 81 MG tablet Take 1 tablet (81 mg total) by mouth daily. 04/13/18   Lendon Colonel, NP  bicalutamide (CASODEX) 50 MG tablet Take 50 mg by mouth daily.    [provider]  docusate sodium  (COLACE) 100 MG capsule Take 100 mg by mouth 2 (two) times daily.    [provider]  doxazosin (CARDURA) 4 MG tablet Take 4 mg by mouth at bedtime.    [provider]  gemfibrozil (LOPID) 600 MG tablet Take 600 mg by mouth 2 (two) times daily before a meal.    [provider]  glipiZIDE (GLUCOTROL XL) 10 MG 24 hr tablet Take 10 mg by mouth daily with breakfast.    [provider]  isosorbide mononitrate (IMDUR) 60 MG 24 hr tablet Take 1 tablet (60 mg total) by mouth daily. 04/13/18   Lendon Colonel, NP  metoprolol tartrate (LOPRESSOR) 50 MG tablet Take 1 tablet (50 mg total) by mouth daily. 04/13/18   Lendon Colonel, NP  nitroGLYCERIN (NITROSTAT) 0.4 MG SL tablet Place 1 tablet (0.4 mg total) under the tongue every 5 (five) minutes as needed for chest pain. 04/13/18   Lendon Colonel, NP  phenazopyridine (PYRIDIUM) 200 MG tablet Take 200 mg by mouth 3 (three) times daily with meals.    [provider]  pioglitazone (ACTOS) 15 MG tablet Take 15 mg by mouth daily.    [provider]  pramoxine-zinc (TRONOLANE) 1-5 % rectal cream Place 28 g rectally every 2 (two) hours as needed for hemorrhoids.    [provider]  tamsulosin (FLOMAX) 0.4 MG CAPS capsule Take 0.4 mg by mouth 2 (two) times daily.    [provider]  ticagrelor (BRILINTA) 90 MG TABS tablet Take 1 tablet (90 mg total) by mouth 2 (two) times daily. 04/13/18   Lendon Colonel, NP    Inpatient Medications: Scheduled Meds: . iopamidol      . aspirin EC  81 mg Oral Daily  . atorvastatin  80 mg Oral q1800  . bicalutamide  50 mg Oral Daily  . docusate sodium  100 mg Oral BID  . gabapentin  300 mg Oral BID  . gemfibrozil  600 mg Oral BID AC  . insulin aspart  0-5 Units Subcutaneous QHS  . insulin aspart  0-9 Units Subcutaneous TID WC  . isosorbide mononitrate  60 mg Oral Daily  . metoprolol tartrate  50 mg Oral Daily  . phenazopyridine  200 mg Oral  TID WC  . tamsulosin  0.4 mg Oral BID  . ticagrelor  90 mg Oral BID   Continuous Infusions: . sodium chloride    . heparin 1,200 Units/hr (08/15/18 0320)   PRN Meds:   Allergies:   Not on File  Social History:   Social History   Socioeconomic History  . Marital status: Divorced    Spouse name: Not on file  . Number of children: Not on file  . Years of education: Not  on file  . Highest education level: Not on file  Occupational History  . Not on file  Social Needs  . Financial resource strain: Not on file  . Food insecurity:    Worry: Not on file    Inability: Not on file  . Transportation needs:    Medical: Not on file    Non-medical: Not on file  Tobacco Use  . Smoking status: Never Smoker  . Smokeless tobacco: Former Systems developer    Types: Chew  Substance and Sexual Activity  . Alcohol use: Not Currently  . Drug use: Not Currently  . Sexual activity: Not Currently  Lifestyle  . Physical activity:    Days per week: Not on file    Minutes per session: Not on file  . Stress: Not on file  Relationships  . Social connections:    Talks on phone: Not on file    Gets together: Not on file    Attends religious service: Not on file    Active member of club or organization: Not on file    Attends meetings of clubs or organizations: Not on file    Relationship status: Not on file  . Intimate partner violence:    Fear of current or ex partner: Not on file    Emotionally abused: Not on file    Physically abused: Not on file    Forced sexual activity: Not on file  Other Topics Concern  . Not on file  Social History Narrative  . Not on file    Family History:     Family History  Problem Relation Age of Onset  . Hypertension Mother      ROS:  Please see the history of present illness.  All other ROS reviewed and negative.     Physical Exam/Data:   Vitals:   08/15/18 0345 08/15/18 0400 08/15/18 0415 08/15/18 0430  BP: 128/64 137/64 136/61 132/61  Pulse: 67 68 71  66  Resp: 16 16 15 18   Temp:      TempSrc:      SpO2: 98% 98% 97% 97%  Weight:      Height:       No intake or output data in the 24 hours ending 08/15/18 0507 Filed Weights   08/14/18 2312  Weight: 93.4 kg   Body mass index is 31.32 kg/m.  General:  Well nourished, well developed, in no acute distress  HEENT: normal Lymph: no adenopathy Neck: Radiation of systolic murmur appreciated Cardiac:  normal S1, S2; RRR; II/VI systolic murmur Lungs:  clear to auscultation bilaterally, no wheezing, rhonchi or rales  Abd: soft, nontender Ext: no edema Musculoskeletal:  No deformities, BUE and BLE strength normal and equal Skin: warm and dry  Neuro:  No focal abnormalities noted Psych:  Normal affect   EKG:  The EKG was personally reviewed and demonstrates:  NSR with no acute ischemic changes Telemetry:  Telemetry was personally reviewed and demonstrates:  NSR with no major events  Relevant CV Studies: LHC 03/2018: Conclusion     Prox RCA lesion is 30% stenosed.  Mid RCA lesion is 30% stenosed.  Ost LAD to Prox LAD lesion is 100% stenosed.  Mid LM lesion is 95% stenosed.  Ost Cx to Prox Cx lesion is 60% stenosed.  Dist Cx lesion is 50% stenosed.  Ost 3rd Mrg lesion is 40% stenosed.  A stent was successfully placed.  Post intervention, there is a 0% residual stenosis.  Prox Cx lesion is 60% stenosed.  Post intervention, there is a 0% residual stenosis.  Post intervention, there is a 0% residual stenosis.  Post intervention, there is a 0% residual stenosis.  Prox Cx to Mid Cx lesion is 15% stenosed.  The left ventricular ejection fraction is 45-50% by visual estimate.  There is mild left ventricular systolic dysfunction.  1st Diag lesion is 70% stenosed.  Prox LAD to Mid LAD lesion is 10% stenosed.  A stent was successfully placed.  Severe native coronary artery disease with 95% stenosis with mild to moderate calcification in the distal left main; total  occlusion of the ostium of the LAD; patent small ramus intermediate vessel; and 60% ostial stenosis extending from the left main stenosis of the circumflex with 60% proximal in-stent renarrowing in the previously placed circumflex stent proximal to a large OM vessel. There is diffuse 50% stenoses in the distal circumflex beyond the stented segment.; Large dominant RCA with 30% proximal and mid focal narrowing.  Patent LIMA graft supplying the mid LAD with excellent filling of the LAD up to its ostium and 70% focal stenosis in the mid first diagonal vessel.  Supravalvular aortography did not demonstrate any vein grafts arising from the aorta.  Difficult but successful complex intervention in the protected left main requiring initial cutting balloon with 2.0 x 6 and 2.5 x 6 mm Wolverine balloons, PTCA and ultimate DES stenting from the left main ostium extending to the proximal circumflex with stent overlap and ending just proximal to the takeoff of a large marginal branch with post stent dilatation up to 3.5 mm in the proximal circumflex and up to 4.1 mm in the left main the entire stenoses being reduced to 0% and brisk TIMI-3 flow.  RECOMMENDATION: DAPT therapy with aspirin/Brilinta must be continued for a minimum of 1 year and would recommend indefinitely. Medical therapy for concomitant CAD.    Laboratory Data:  Chemistry Recent Labs  Lab 08/14/18 2318  NA 139  K 4.1  CL 108  CO2 22  GLUCOSE 111*  BUN 27*  CREATININE 1.24  CALCIUM 9.7  GFRNONAA 56*  GFRAA >60  ANIONGAP 9    No results for input(s): PROT, ALBUMIN, AST, ALT, ALKPHOS, BILITOT in the last 168 hours. Hematology Recent Labs  Lab 08/14/18 2318  WBC 4.7  RBC 3.69*  HGB 11.7*  HCT 35.5*  MCV 96.2  MCH 31.7  MCHC 33.0  RDW 13.9  PLT 142*   Cardiac EnzymesNo results for input(s): TROPONINI in the last 168 hours.  Recent Labs  Lab 08/14/18 2317 08/15/18 0313  TROPIPOC 0.05 0.06    BNPNo results for  input(s): BNP, PROBNP in the last 168 hours.  DDimer No results for input(s): DDIMER in the last 168 hours.  Radiology/Studies:  Dg Chest 2 View  Result Date: 08/15/2018 CLINICAL DATA:  Chest pain. EXAM: CHEST - 2 VIEW COMPARISON:  None. FINDINGS: Post median sternotomy and CABG. Mild cardiomegaly. Aortic atherosclerosis. Bilateral calcified pleural plaques. No pulmonary edema. No confluent airspace disease, pleural effusion or pneumothorax. Degenerative change in the spine. IMPRESSION: 1. Cardiomegaly post CABG.  No evidence of CHF. 2. 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 Electronically Signed   By: Jeb Levering M.D.   On: 08/15/2018 00:38    Assessment and Plan:   Chest pain, dyspnea: Coronary artery disease with prior CABG, PCI: The patient has severe CAD with recent complex PCI. He returns to the ED with exertional chest pain and dyspnea. ECG and troponin are reassuring against  MI, but his clinical presentation may be consistent with possible angina. He was started on a heparin gtt in the ED for UA. However, the patient states that he has not had chest pain in several days due to reduction in activity level.  -Continue to monitor troponin. May be reasonable to stop heparin gtt if next set is negative. -Will likely need repeat ischemic evaluation, with type and timing to be determined based on clinical trajectory. -Continue ASA, ticagrelor (was not switched to clopidogrel on my review of [facility]) -Starting atorvastatin, as statin has not been prescribed for unclear reasons.   Systolic murmur The patient has a systolic murmur on exam that is suggestive of AV disease. It is unclear if this may be contributing to his symptoms. I do not see prior echo in the system, although he says he has had them performed previously.    -Repeat echocardiogram ordered   Hypecholesterolemia:  -Lipid panel ordered -Starting statin as above. Continue gemfibrozil.    Hypertension:  Currently reasonably controlled.  For questions or updates, please contact [place] Please consult [email or web address] for contact info under Cardiology/STEMI.   Signed, Nila Nephew, MD  08/15/2018 5:07 AM

## 2018-08-15 NOTE — Progress Notes (Signed)
ANTICOAGULATION CONSULT NOTE - Initial Consult  Pharmacy Consult for heparin Indication: USAP  Not on File  Patient Measurements: Height: 5\' 8"  (172.7 cm) Weight: 206 lb (93.4 kg) IBW/kg (Calculated) : 68.4 Heparin Dosing Weight: 88 kg  Vital Signs: Temp: 98.4 F (36.9 C) (08/23 2302) Temp Source: Oral (08/23 2302) BP: 159/71 (08/24 0237) Pulse Rate: 71 (08/24 0237)  Labs: Recent Labs    08/14/18 2318  HGB 11.7*  HCT 35.5*  PLT 142*  CREATININE 1.24    Estimated Creatinine Clearance: 58.8 mL/min (by C-G formula based on SCr of 1.24 mg/dL).   Medical History: Past Medical History:  Diagnosis Date  . Anxiety   . Arthritis    "hands, lower back,; L4-5;  neck; C1-2" (03/31/2018)  . Asbestosis ([place])    "of my lungs"  . Asthma   . CAD (coronary artery disease)    CABG 14 days ago  . CAD S/P percutaneous coronary angioplasty 03/31/2018   LM PCI  . Chronic lower back pain   . Heart murmur   . High cholesterol   . Hypertension   . Myocardial infarction [facility]) 2006; 2007; 2012  . Prostate cancer [facility])    "put 3 beamers in my prostate in 11/2017 & radiation completed in 02/2018" (03/31/2018)  . Type II diabetes mellitus ([facility])     Medications:  See medication history  Assessment: 72 yo man to start heparin for USAP.  He was not on anticoagulation PTA Goal of Therapy:  Heparin level 0.3-0.7 units/ml Monitor platelets by anticoagulation protocol: Yes   Plan:  Heparin 4000 unit bolus and drip at 1200 units/hr Check heparin level 6 hours after start Daily HL and CBC while on heparin  Jakirah Zaun [place] 08/15/2018,2:55 AM

## 2018-08-15 NOTE — Progress Notes (Addendum)
PROGRESS NOTE  Miguel Garcia KPT:[id number] DOB: 23-Oct-1946 DOA: 08/14/2018 PCP: Patient, No Pcp Per  HPI/Recap of past 24 hours: HPI: Miguel Garcia is a 72 y.o. male with medical history significant of hypertension, hyperlipidemia, diabetes mellitus, asthma, asbestosis, anxiety, CAD, stent placement 03/2018, CABG 2007, prostate cancer (radiation therapy), who presents with chest pain.  Patient was admitted to observation early this morning.  He was started on aspirin Brilinta and heparin and he is scheduled to have cardiac catheterization on Monday. Nurse calls because patient is having some bright red blood small amount in her urine and in his stool.  Nurse are advised to hold heparin , she will call cardiology about holding Brilinta and aspirin    Assessment/Plan: Principal Problem:   Chest pain Active Problems:   Hx of CABG   CAD S/P percutaneous coronary angioplasty   Dyslipidemia, goal LDL below 70   Essential hypertension   Diabetes mellitus without complication (HCC)   Prostate cancer ([place])   AKI (acute kidney injury) ([place])   Chest pain and hx of CAD S/P percutaneous coronary angioplasty: s/p of stent and CABG. patient has exertional chest pain, concerning for unstable angina, ED physician start patient with IV heparin.  Cardiology, Dr. Rhae Hammock was consulted, who recommended to stop heparin if next set of enzymes negative.  He also recommended to start Lipitor and get 2D echo.  Patient's chest is radiating to the upper back between shoulder blades, will need to rule out dissection.  - will admit to tele bed as inpt - will place on Tele bed for obs - cycle CE q6 x3 and repeat EKG in the am  - prn Nitroglycerin, Morphine, and aspirin, brilinta, lipitor, Imdur - Risk factor stratification: will check FLP and A1C  - 2d echo -CAT to r/o dissection  HLD: -Gemfibrozil, Lipitor  HTN:  -Metoprolol -IV hydralazine prn  Diabetes mellitus without complication ([place]):  Last A1c not on record. Patient is taking Actos and glipizide at home.  Blood sugar 111. -SSI -f/u Check A1c  Prostate cancer [facility]): s/p of radiation therapy -Continue Bicalutamid -check UA due to dysuria  AKI (acute kidney injury) [facility]): Creatinine 1.24, BUN 27.  Likely due to dehydration. -IV fluid: Normal saline 75 cc/h -Follow-up renal function by BMP  DVT ppx: on Heparin  Code Status: Full code Family Communication:    Yes, patient's police officer at bed side Disposition Plan:  Anticipate discharge back to [facility] called: Cardiology, Dr. Rhae Hammock was consulted. Admission status: Obs / tele   Procedures:  none   Antimicrobials:  none    Objective: Vitals:   08/15/18 0545 08/15/18 1144  BP: (!) 150/68 103/60  Pulse:  (!) 57  Resp:  18  Temp:  99.2 F (37.3 C)  SpO2:  98%    Intake/Output Summary (Last 24 hours) at 08/15/2018 1437 Last data filed at 08/15/2018 1314 Gross per 24 hour  Intake 480 ml  Output 950 ml  Net -470 ml   Filed Weights   08/14/18 2312 08/15/18 0545  Weight: 93.4 kg 92.8 kg   Body mass index is 31.09 kg/m.  Exam:  General: Not in acute distress HEENT:       Eyes: PERRL, EOMI, no scleral icterus.       ENT: No discharge from the ears and nose, no pharynx injection, no tonsillar enlargement.        Neck: No JVD, no bruit, no mass felt. Heme: No neck lymph node enlargement. Cardiac: S1/S2, RRR, No  murmurs, No gallops or rubs. Respiratory: Good air movement bilaterally. No rales, wheezing, rhonchi or rubs. GI: Soft, nondistended, nontender, no rebound pain, no organomegaly, BS present. GU: No hematuria Ext: No pitting leg edema bilaterally. 2+DP/PT pulse bilaterally. Musculoskeletal: No joint deformities, No joint redness or warmth, no limitation of ROM in spin. Skin: No rashes.  Neuro: Alert, oriented X3, cranial nerves II-XII grossly intact, moves all extremities normally. Muscle strength 5/5 in all extremities,  sensation to light touch intact. Brachial reflex 2+ bilaterally. Knee reflex 1+ bilaterally. Negative Babinski's sign. Normal finger to nose test. Psych: Patient is not psychotic, no suicidal or hemocidal ideation.  Data Reviewed: CBC: Recent Labs  Lab 08/14/18 2318  WBC 4.7  HGB 11.7*  HCT 35.5*  MCV 96.2  PLT 941*   Basic Metabolic Panel: Recent Labs  Lab 08/14/18 2318  NA 139  K 4.1  CL 108  CO2 22  GLUCOSE 111*  BUN 27*  CREATININE 1.24  CALCIUM 9.7   GFR: Estimated Creatinine Clearance: 58.7 mL/min (by C-G formula based on SCr of 1.24 mg/dL). Liver Function Tests: No results for input(s): AST, ALT, ALKPHOS, BILITOT, PROT, ALBUMIN in the last 168 hours. No results for input(s): LIPASE, AMYLASE in the last 168 hours. No results for input(s): AMMONIA in the last 168 hours. Coagulation Profile: No results for input(s): INR, PROTIME in the last 168 hours. Cardiac Enzymes: Recent Labs  Lab 08/15/18 0632 08/15/18 1203  TROPONINI <0.03 <0.03   BNP (last 3 results) No results for input(s): PROBNP in the last 8760 hours. HbA1C: Recent Labs    08/15/18 0632  HGBA1C 6.1*   CBG: Recent Labs  Lab 08/15/18 0819 08/15/18 1143  GLUCAP 75 126*   Lipid Profile: Recent Labs    08/15/18 0632  CHOL 119  HDL 34*  LDLCALC 69  TRIG 80  CHOLHDL 3.5   Thyroid Function Tests: No results for input(s): TSH, T4TOTAL, FREET4, T3FREE, THYROIDAB in the last 72 hours. Anemia Panel: No results for input(s): VITAMINB12, FOLATE, FERRITIN, TIBC, IRON, RETICCTPCT in the last 72 hours. Urine analysis:    Component Value Date/Time   COLORURINE STRAW (A) 08/15/2018 0904   APPEARANCEUR CLEAR 08/15/2018 0904   LABSPEC 1.023 08/15/2018 0904   PHURINE 5.0 08/15/2018 0904   GLUCOSEU NEGATIVE 08/15/2018 0904   HGBUR NEGATIVE 08/15/2018 0904   BILIRUBINUR NEGATIVE 08/15/2018 0904   KETONESUR NEGATIVE 08/15/2018 0904   PROTEINUR NEGATIVE 08/15/2018 0904   NITRITE NEGATIVE  08/15/2018 0904   LEUKOCYTESUR NEGATIVE 08/15/2018 0904   Sepsis Labs: @LABRCNTIP (procalcitonin:4,lacticidven:4)  ) Recent Results (from the past 240 hour(s))  MRSA PCR Screening     Status: None   Collection Time: 08/15/18  6:48 AM  Result Value Ref Range Status   MRSA by PCR NEGATIVE NEGATIVE Final    Comment:        The GeneXpert MRSA Assay (FDA approved for NASAL specimens only), is one component of a comprehensive MRSA colonization surveillance program. It is not intended to diagnose MRSA infection nor to guide or monitor treatment for MRSA infections. Performed at [facility], [place] [address] [place], [place] [address]       Studies: Dg Chest 2 View  Result Date: 08/15/2018 CLINICAL DATA:  Chest pain. EXAM: CHEST - 2 VIEW COMPARISON:  None. FINDINGS: Post median sternotomy and CABG. Mild cardiomegaly. Aortic atherosclerosis. Bilateral calcified pleural plaques. No pulmonary edema. No confluent airspace disease, pleural effusion or pneumothorax. Degenerative change in the spine. IMPRESSION: 1. Cardiomegaly  post CABG.  No evidence of CHF. 2. 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 Electronically Signed   By: Jeb Levering M.D.   On: 08/15/2018 00:38   Ct Angio Chest/abd/pel For Dissection W And/or W/wo  Result Date: 08/15/2018 CLINICAL DATA:  Chest pain radiating to the back. EXAM: CT ANGIOGRAPHY CHEST, ABDOMEN AND PELVIS TECHNIQUE: Multidetector CT imaging through the chest, abdomen and pelvis was performed using the standard protocol during bolus administration of intravenous contrast. Multiplanar reconstructed images and MIPs were obtained and reviewed to evaluate the vascular anatomy. CONTRAST:  134mL ISOVUE-370 IOPAMIDOL (ISOVUE-370) INJECTION 76% COMPARISON:  Radiograph earlier this day. FINDINGS: CTA CHEST FINDINGS Cardiovascular: Aortic atherosclerosis without dissection, hematoma, aneurysm, acute aortic syndrome or  vasculitis. Heart is normal in size. Post CABG with dense calcification of native coronary arteries. No pericardial effusion. No central pulmonary embolus. Mediastinum/Nodes: No enlarged mediastinal or hilar lymph nodes. Thyroid gland is normal. The esophagus is decompressed. Lungs/Pleura: Multiple bilateral calcified pleural plaques. No focal consolidation. No pulmonary edema. No pleural fluid. Trachea and mainstem bronchi are patent. Linear atelectasis in the lower lobes, left greater than right. Musculoskeletal: Post median sternotomy. Degenerative change throughout spine. There are no acute or suspicious osseous abnormalities. Review of the MIP images confirms the above findings. CTA ABDOMEN AND PELVIS FINDINGS VASCULAR Aorta: Moderate atherosclerosis without aneurysm, dissection or significant stenosis. No evidence of vasculitis. 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 SMA: Patent without evidence of aneurysm, dissection, vasculitis or significant stenosis. Renals: Plaque at the origin with approximately 50% stenosis on the right. No aneurysm or dissection. IMA: Patent without evidence of aneurysm, dissection, vasculitis or significant stenosis. Inflow: Patent without evidence of aneurysm, dissection, vasculitis or significant stenosis. Veins: No obvious venous abnormality within the limitations of this arterial phase study. Review of the MIP images confirms the above findings. NON-VASCULAR Hepatobiliary: No focal hepatic lesion on arterial phase imaging. Postcholecystectomy without biliary dilatation. Pancreas: No ductal dilatation or inflammation. Spleen: Normal arterial phase enhancement.  Normal in size. Adrenals/Urinary Tract: Normal adrenal glands. No hydronephrosis. Mild nonspecific perinephric edema. Urinary bladder is distended. Stomach/Bowel: Stomach is nondistended. No evidence of bowel wall  thickening, inflammatory change or obstruction. Descending and sigmoid colonic diverticulosis without diverticulitis. Normal appendix. Lymphatic: No enlarged abdominal or pelvic lymph nodes. 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 Other: No free air or ascites. Musculoskeletal: There are no acute or suspicious osseous abnormalities. Review of the MIP images confirms the above findings. IMPRESSION: 1. Diffuse aortic atherosclerosis without dissection or acute aortic abnormality. 2. 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 3. No acute chest finding. Calcified pleural plaques consistent with prior asbestos exposure. 4. No acute abdominopelvic finding. Incidental finding of colonic diverticulosis without diverticulitis. Electronically Signed   By: Jeb Levering M.D.   On: 08/15/2018 06:29    Scheduled Meds: . aspirin EC  81 mg Oral Daily  . atorvastatin  80 mg Oral q1800  . bicalutamide  50 mg Oral Daily  . docusate sodium  100 mg Oral BID  . gabapentin  300 mg Oral BID  . gemfibrozil  600 mg Oral BID AC  . insulin aspart  0-5 Units Subcutaneous QHS  . insulin aspart  0-9 Units Subcutaneous TID WC  . iopamidol      . isosorbide mononitrate  60 mg Oral Daily  . metoprolol tartrate  50 mg Oral Daily  .  phenazopyridine  200 mg Oral TID WC  . tamsulosin  0.4 mg Oral BID  . ticagrelor  90 mg Oral BID    Continuous Infusions: . sodium chloride    . heparin Stopped (08/15/18 1330)     LOS: 0 days     Cristal Deer, MD [facility]  To reach me or the doctor on call, go to: [email or web address] Pager #[phone]  08/15/2018, 2:37 PM

## 2018-08-15 NOTE — Progress Notes (Signed)
CBC stat order placed per Verbal order of attending MD  Palma Holter, RN

## 2018-08-15 NOTE — ED Notes (Signed)
Patient transported to CT 

## 2018-08-15 NOTE — Progress Notes (Signed)
Progress Note  Patient Name: Miguel Garcia Date of Encounter: 08/15/2018  Primary Cardiologist:  Claiborne Billings  Subjective   No pain   Inpatient Medications    Scheduled Meds: . aspirin EC  81 mg Oral Daily  . atorvastatin  80 mg Oral q1800  . bicalutamide  50 mg Oral Daily  . docusate sodium  100 mg Oral BID  . gabapentin  300 mg Oral BID  . gemfibrozil  600 mg Oral BID AC  . insulin aspart  0-5 Units Subcutaneous QHS  . insulin aspart  0-9 Units Subcutaneous TID WC  . iopamidol      . isosorbide mononitrate  60 mg Oral Daily  . metoprolol tartrate  50 mg Oral Daily  . phenazopyridine  200 mg Oral TID WC  . tamsulosin  0.4 mg Oral BID  . ticagrelor  90 mg Oral BID   Continuous Infusions: . sodium chloride    . heparin 1,200 Units/hr (08/15/18 0320)   PRN Meds: acetaminophen, albuterol, ALPRAZolam, hydrALAZINE, hydrocortisone, morphine injection, nitroGLYCERIN, ondansetron (ZOFRAN) IV, zolpidem   Vital Signs    Vitals:   08/15/18 0415 08/15/18 0430 08/15/18 0500 08/15/18 0545  BP: 136/61 132/61 121/65 (!) 150/68  Pulse: 71 66 68   Resp: 15 18 18    Temp:      TempSrc:      SpO2: 97% 97% 97%   Weight:    92.8 kg  Height:    5\' 8"  (1.727 m)    Intake/Output Summary (Last 24 hours) at 08/15/2018 0935 Last data filed at 08/15/2018 0900 Gross per 24 hour  Intake 0 ml  Output 300 ml  Net -300 ml   Filed Weights   08/14/18 2312 08/15/18 0545  Weight: 93.4 kg 92.8 kg    Telemetry    NSR 08/15/2018 - Personally Reviewed  ECG    SR rate 71 no acute changes - Personally Reviewed  Physical Exam  Post sternotomy  GEN: No acute distress.   Neck: No JVD Cardiac: RRR, no murmurs, rubs, or gallops.  Respiratory: Clear to auscultation bilaterally. GI: Soft, nontender, non-distended  MS: No edema; No deformity. Neuro:  Nonfocal  Psych: Normal affect   Labs    Chemistry Recent Labs  Lab 08/14/18 2318  NA 139  K 4.1  CL 108  CO2 22  GLUCOSE 111*  BUN  27*  CREATININE 1.24  CALCIUM 9.7  GFRNONAA 56*  GFRAA >60  ANIONGAP 9     Hematology Recent Labs  Lab 08/14/18 2318  WBC 4.7  RBC 3.69*  HGB 11.7*  HCT 35.5*  MCV 96.2  MCH 31.7  MCHC 33.0  RDW 13.9  PLT 142*    Cardiac Enzymes Recent Labs  Lab 08/15/18 0632  TROPONINI <0.03    Recent Labs  Lab 08/14/18 2317 08/15/18 0313  TROPIPOC 0.05 0.06     BNPNo results for input(s): BNP, PROBNP in the last 168 hours.   DDimer No results for input(s): DDIMER in the last 168 hours.   Radiology    Dg Chest 2 View  Result Date: 08/15/2018 CLINICAL DATA:  Chest pain. EXAM: CHEST - 2 VIEW COMPARISON:  None. FINDINGS: Post median sternotomy and CABG. Mild cardiomegaly. Aortic atherosclerosis. Bilateral calcified pleural plaques. No pulmonary edema. No confluent airspace disease, pleural effusion or pneumothorax. Degenerative change in the spine. IMPRESSION: 1. Cardiomegaly post CABG.  No evidence of CHF. 2. 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 Electronically Signed   By: Threasa Beards  Ehinger M.D.   On: 08/15/2018 00:38   Ct Angio Chest/abd/pel For Dissection W And/or W/wo  Result Date: 08/15/2018 CLINICAL DATA:  Chest pain radiating to the back. EXAM: CT ANGIOGRAPHY CHEST, ABDOMEN AND PELVIS TECHNIQUE: Multidetector CT imaging through the chest, abdomen and pelvis was performed using the standard protocol during bolus administration of intravenous contrast. Multiplanar reconstructed images and MIPs were obtained and reviewed to evaluate the vascular anatomy. CONTRAST:  12mL ISOVUE-370 IOPAMIDOL (ISOVUE-370) INJECTION 76% COMPARISON:  Radiograph earlier this day. FINDINGS: CTA CHEST FINDINGS Cardiovascular: Aortic atherosclerosis without dissection, hematoma, aneurysm, acute aortic syndrome or vasculitis. Heart is normal in size. Post CABG with dense calcification of native coronary arteries. No pericardial effusion. No central pulmonary embolus.  Mediastinum/Nodes: No enlarged mediastinal or hilar lymph nodes. Thyroid gland is normal. The esophagus is decompressed. Lungs/Pleura: Multiple bilateral calcified pleural plaques. No focal consolidation. No pulmonary edema. No pleural fluid. Trachea and mainstem bronchi are patent. Linear atelectasis in the lower lobes, left greater than right. Musculoskeletal: Post median sternotomy. Degenerative change throughout spine. There are no acute or suspicious osseous abnormalities. Review of the MIP images confirms the above findings. CTA ABDOMEN AND PELVIS FINDINGS VASCULAR Aorta: Moderate atherosclerosis without aneurysm, dissection or significant stenosis. No evidence of vasculitis. 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 SMA: Patent without evidence of aneurysm, dissection, vasculitis or significant stenosis. Renals: Plaque at the origin with approximately 50% stenosis on the right. No aneurysm or dissection. IMA: Patent without evidence of aneurysm, dissection, vasculitis or significant stenosis. Inflow: Patent without evidence of aneurysm, dissection, vasculitis or significant stenosis. Veins: No obvious venous abnormality within the limitations of this arterial phase study. Review of the MIP images confirms the above findings. NON-VASCULAR Hepatobiliary: No focal hepatic lesion on arterial phase imaging. Postcholecystectomy without biliary dilatation. Pancreas: No ductal dilatation or inflammation. Spleen: Normal arterial phase enhancement.  Normal in size. Adrenals/Urinary Tract: Normal adrenal glands. No hydronephrosis. Mild nonspecific perinephric edema. Urinary bladder is distended. Stomach/Bowel: Stomach is nondistended. No evidence of bowel wall thickening, inflammatory change or obstruction. Descending and sigmoid colonic diverticulosis without diverticulitis. Normal appendix. Lymphatic: No enlarged  abdominal or pelvic lymph nodes. 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 Other: No free air or ascites. Musculoskeletal: There are no acute or suspicious osseous abnormalities. Review of the MIP images confirms the above findings. IMPRESSION: 1. Diffuse aortic atherosclerosis without dissection or acute aortic abnormality. 2. 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 3. No acute chest finding. Calcified pleural plaques consistent with prior asbestos exposure. 4. No acute abdominopelvic finding. Incidental finding of colonic diverticulosis without diverticulitis. Electronically Signed   By: Jeb Levering M.D.   On: 08/15/2018 06:29    Cardiac Studies   TTE Pending   Patient Profile     72 y.o. male with previous CABG. April 08/2018 cath with moderate RCA disease, LIMA patent To LAD and complex stenting of LM and large area of proximal and mid circumflex. Admitted with 2 weeks SSCP consistent with angina Enzymes negative no acute ECG changes  Assessment & Plan    Angina:  Need relook cath given complexity of circumflex intervention 4 months ago Continue DAT and heparin  Have placed on board for cath Continue nitrates and beta blocker  HLD:  Continue statin        For questions or updates, please contact [facility] Please consult [email or web address] for contact info under  Cardiology/STEMI.      Signed, Jenkins Rouge, MD  08/15/2018, 9:35 AM

## 2018-08-15 NOTE — H&P (Signed)
History and Physical    Miguel Garcia WER:[id number] DOB: 1946-08-06 DOA: 08/14/2018  Referring MD/NP/PA:   PCP: Patient, No Pcp Per   Patient coming from:  The patient is coming from jail.  At baseline, pt is independent for most of ADL.  Chief Complaint: chest pain  HPI: Miguel Garcia is a 72 y.o. male with medical history significant of hypertension, hyperlipidemia, diabetes mellitus, asthma, asbestosis, anxiety, CAD, stent placement 03/2018, CABG 2007, prostate cancer (radiation therapy), who presents with chest pain  Patient states that he has been having intermittent chest pain for more than 6 days.  It is located in the substernal area, 4 out of 10 severity, dull, radiating to the upper back between shoulder blades.  It is exertional, aggravated by exertion, alleviated by rest.  It is associated with mild shortness of breath.  Patient has a dry cough, but no fever or chills.  No tenderness in the calf areas.  Patient denies nausea, vomiting, diarrhea, abdominal pain.  Patient states that he has history of prostate cancer, he has intermittent dysuria and burning on urination.  No unilateral weakness.  ED Course: pt was found to have negative troponin, WBC 4.7, pending urinalysis, AKI with creatinine 1.24, BUN 27, temperature normal, no tachycardia, no tachypnea, oxygen saturation 100% on room air, temperature normal.  Chest x-ray is negative for infiltration.  Patient is placed on telemetry bed for observation.  Cardiology, Dr. Rhae Hammock was consulted.  IV heparin was started by EDP.  Review of Systems:   General: no fevers, chills, no body weight gain, has fatigue HEENT: no blurry vision, hearing changes or sore throat Respiratory: has dyspnea, coughing, no wheezing CV: has chest pain, no palpitations GI: no nausea, vomiting, abdominal pain, diarrhea, constipation GU: no dysuria, burning on urination, increased urinary frequency, hematuria  Ext: no leg edema Neuro: no unilateral  weakness, numbness, or tingling, no vision change or hearing loss Skin: no rash, no skin tear. MSK: No muscle spasm, no deformity, no limitation of range of movement in spin Heme: No easy bruising.  Travel history: No recent long distant travel.  Allergy: Patient states that she was allergic to something, but does not remember exactly what it was, but stating that he is not allergic to any contrast.     Past Medical History:  Diagnosis Date  . Anxiety   . Arthritis    "hands, lower back,; L4-5;  neck; C1-2" (03/31/2018)  . Asbestosis ([place])    "of my lungs"  . Asthma   . CAD (coronary artery disease)    CABG 14 days ago  . CAD S/P percutaneous coronary angioplasty 03/31/2018   LM PCI  . Chronic lower back pain   . Heart murmur   . High cholesterol   . Hypertension   . Myocardial infarction [facility]) 2006; 2007; 2012  . Prostate cancer [facility])    "put 3 beamers in my prostate in 11/2017 & radiation completed in 02/2018" (03/31/2018)  . Type II diabetes mellitus ([place])     Past Surgical History:  Procedure Laterality Date  . BACK SURGERY    . CORONARY ANGIOPLASTY WITH STENT PLACEMENT  03/31/2018  . CORONARY ARTERY BYPASS GRAFT  2007  . CORONARY STENT INTERVENTION N/A 03/31/2018   Procedure: CORONARY STENT INTERVENTION;  Surgeon: Troy Sine, MD;  Location: [place] [facility];  Service: Cardiovascular;  Laterality: N/A;  . FOREARM FRACTURE SURGERY Right   . FRACTURE SURGERY    . HEMORRHOID BANDING    . LAPAROSCOPIC  CHOLECYSTECTOMY    . LEFT HEART CATH AND CORONARY ANGIOGRAPHY N/A 03/31/2018   Procedure: LEFT HEART CATH AND CORONARY ANGIOGRAPHY;  Surgeon: Troy Sine, MD;  Location: [place] [facility];  Service: Cardiovascular;  Laterality: N/A;  . ORIF SHOULDER FRACTURE Left   . POSTERIOR FUSION LUMBAR SPINE     L4-5  . PROSTATE BIOPSY     "put in [employer] in 11/2017"    Social History:  reports that he has never smoked. He has quit using smokeless tobacco.   His smokeless tobacco use included chew. He reports that he drank alcohol. He reports that he has current or past drug history.  Family History:  Family History  Problem Relation Age of Onset  . Hypertension Mother      Prior to Admission medications   Medication Sig Start Date End Date Taking? Authorizing Provider  acetaminophen (TYLENOL) 325 MG tablet Take 2 tablets (650 mg total) by mouth every 4 (four) hours as needed for headache or mild pain. 04/01/18   Erlene Quan, PA-C  albuterol (PROVENTIL HFA;VENTOLIN HFA) 108 (90 Base) MCG/ACT inhaler Inhale 2 puffs into the lungs 4 (four) times daily as needed for wheezing or shortness of breath.    [provider]  aspirin EC 81 MG tablet Take 1 tablet (81 mg total) by mouth daily. 04/13/18   Lendon Colonel, NP  bicalutamide (CASODEX) 50 MG tablet Take 50 mg by mouth daily.    [provider]  docusate sodium (COLACE) 100 MG capsule Take 100 mg by mouth 2 (two) times daily.    [provider]  doxazosin (CARDURA) 4 MG tablet Take 4 mg by mouth at bedtime.    [provider]  gemfibrozil (LOPID) 600 MG tablet Take 600 mg by mouth 2 (two) times daily before a meal.    [provider]  glipiZIDE (GLUCOTROL XL) 10 MG 24 hr tablet Take 10 mg by mouth daily with breakfast.    [provider]  isosorbide mononitrate (IMDUR) 60 MG 24 hr tablet Take 1 tablet (60 mg total) by mouth daily. 04/13/18   Lendon Colonel, NP  metoprolol tartrate (LOPRESSOR) 50 MG tablet Take 1 tablet (50 mg total) by mouth daily. 04/13/18   Lendon Colonel, NP  nitroGLYCERIN (NITROSTAT) 0.4 MG SL tablet Place 1 tablet (0.4 mg total) under the tongue every 5 (five) minutes as needed for chest pain. 04/13/18   Lendon Colonel, NP  phenazopyridine (PYRIDIUM) 200 MG tablet Take 200 mg by mouth 3 (three) times daily with meals.    [provider]  pioglitazone (ACTOS) 15 MG tablet Take 15 mg by mouth daily.     [provider]  pramoxine-zinc (TRONOLANE) 1-5 % rectal cream Place 28 g rectally every 2 (two) hours as needed for hemorrhoids.    [provider]  tamsulosin (FLOMAX) 0.4 MG CAPS capsule Take 0.4 mg by mouth 2 (two) times daily.    [provider]  ticagrelor (BRILINTA) 90 MG TABS tablet Take 1 tablet (90 mg total) by mouth 2 (two) times daily. 04/13/18   Lendon Colonel, NP    Physical Exam: Vitals:   08/15/18 0400 08/15/18 0415 08/15/18 0430 08/15/18 0500  BP: 137/64 136/61 132/61 121/65  Pulse: 68 71 66 68  Resp: 16 15 18 18   Temp:      TempSrc:      SpO2: 98% 97% 97% 97%  Weight:  Height:       General: Not in acute distress HEENT:       Eyes: PERRL, EOMI, no scleral icterus.       ENT: No discharge from the ears and nose, no pharynx injection, no tonsillar enlargement.        Neck: No JVD, no bruit, no mass felt. Heme: No neck lymph node enlargement. Cardiac: S1/S2, RRR, No murmurs, No gallops or rubs. Respiratory: Good air movement bilaterally. No rales, wheezing, rhonchi or rubs. GI: Soft, nondistended, nontender, no rebound pain, no organomegaly, BS present. GU: No hematuria Ext: No pitting leg edema bilaterally. 2+DP/PT pulse bilaterally. Musculoskeletal: No joint deformities, No joint redness or warmth, no limitation of ROM in spin. Skin: No rashes.  Neuro: Alert, oriented X3, cranial nerves II-XII grossly intact, moves all extremities normally. Muscle strength 5/5 in all extremities, sensation to light touch intact. Brachial reflex 2+ bilaterally. Knee reflex 1+ bilaterally. Negative Babinski's sign. Normal finger to nose test. Psych: Patient is not psychotic, no suicidal or hemocidal ideation.  Labs on Admission: I have personally reviewed following labs and imaging studies  CBC: Recent Labs  Lab 08/14/18 2318  WBC 4.7  HGB 11.7*  HCT 35.5*  MCV 96.2  PLT 062*   Basic Metabolic Panel: Recent Labs  Lab 08/14/18 2318    NA 139  K 4.1  CL 108  CO2 22  GLUCOSE 111*  BUN 27*  CREATININE 1.24  CALCIUM 9.7   GFR: Estimated Creatinine Clearance: 58.8 mL/min (by C-G formula based on SCr of 1.24 mg/dL). Liver Function Tests: No results for input(s): AST, ALT, ALKPHOS, BILITOT, PROT, ALBUMIN in the last 168 hours. No results for input(s): LIPASE, AMYLASE in the last 168 hours. No results for input(s): AMMONIA in the last 168 hours. Coagulation Profile: No results for input(s): INR, PROTIME in the last 168 hours. Cardiac Enzymes: No results for input(s): CKTOTAL, CKMB, CKMBINDEX, TROPONINI in the last 168 hours. BNP (last 3 results) No results for input(s): PROBNP in the last 8760 hours. HbA1C: No results for input(s): HGBA1C in the last 72 hours. CBG: No results for input(s): GLUCAP in the last 168 hours. Lipid Profile: No results for input(s): CHOL, HDL, LDLCALC, TRIG, CHOLHDL, LDLDIRECT in the last 72 hours. Thyroid Function Tests: No results for input(s): TSH, T4TOTAL, FREET4, T3FREE, THYROIDAB in the last 72 hours. Anemia Panel: No results for input(s): VITAMINB12, FOLATE, FERRITIN, TIBC, IRON, RETICCTPCT in the last 72 hours. Urine analysis: No results found for: COLORURINE, APPEARANCEUR, LABSPEC, PHURINE, GLUCOSEU, HGBUR, BILIRUBINUR, KETONESUR, PROTEINUR, UROBILINOGEN, NITRITE, LEUKOCYTESUR Sepsis Labs: @LABRCNTIP (procalcitonin:4,lacticidven:4) )No results found for this or any previous visit (from the past 240 hour(s)).   Radiological Exams on Admission: Dg Chest 2 View  Result Date: 08/15/2018 CLINICAL DATA:  Chest pain. EXAM: CHEST - 2 VIEW COMPARISON:  None. FINDINGS: Post median sternotomy and CABG. Mild cardiomegaly. Aortic atherosclerosis. Bilateral calcified pleural plaques. No pulmonary edema. No confluent airspace disease, pleural effusion or pneumothorax. Degenerative change in the spine. IMPRESSION: 1. Cardiomegaly post CABG.  No evidence of CHF. 2. Bilateral calcified pleural  plaques consistent with prior asbestos exposure. Electronically Signed   By: Jeb Levering M.D.   On: 08/15/2018 00:38     EKG: Independently reviewed.  Sinus rhythm, QTC 452, Q waves in lead III   Assessment/Plan Principal Problem:   Chest pain Active Problems:   Hx of CABG   CAD S/P percutaneous coronary angioplasty   Dyslipidemia, goal LDL below 70   Essential hypertension  Diabetes mellitus without complication ([place])   Prostate cancer ([place])   AKI (acute kidney injury) ([place])   Chest pain and hx of CAD S/P percutaneous coronary angioplasty: s/p of stent and CABG. patient has exertional chest pain, concerning for unstable angina, ED physician start patient with IV heparin.  Cardiology, Dr. Rhae Hammock was consulted, who recommended to stop heparin if next set of enzymes negative.  He also recommended to start Lipitor and get 2D echo.  Patient's chest is radiating to the upper back between shoulder blades, will need to rule out dissection.  - will admit to tele bed as inpt - will place on Tele bed for obs - cycle CE q6 x3 and repeat EKG in the am  - prn Nitroglycerin, Morphine, and aspirin, brilinta, lipitor, Imdur - Risk factor stratification: will check FLP and A1C  - 2d echo -CAT to r/o dissection  HLD: -Gemfibrozil, Lipitor  HTN:  -Metoprolol -IV hydralazine prn  Diabetes mellitus without complication ([place]): Last A1c not on record. Patient is taking Actos and glipizide at home.  Blood sugar 111. -SSI -f/u Check A1c  Prostate cancer [facility]): s/p of radiation therapy -Continue Bicalutamid -check UA due to dysuria  AKI (acute kidney injury) [facility]): Creatinine 1.24, BUN 27.  Likely due to dehydration. -IV fluid: Normal saline 75 cc/h -Follow-up renal function by BMP   DVT ppx: on Heparin  Code Status: Full code Family Communication:    Yes, patient's police officer at bed side Disposition Plan:  Anticipate discharge back to [facility] called: Cardiology, Dr.  Rhae Hammock was consulted. Admission status: Obs / tele   Date of Service 08/15/2018    Ivor Costa [facility] Pager [phone]  If 7PM-7AM, please contact night-coverage [email or web address] Password [facility] 08/15/2018, 5:40 AM

## 2018-08-15 NOTE — Progress Notes (Signed)
*  PRELIMINARY RESULTS* Echocardiogram 2D Echocardiogram has been performed.  Leavy Cella 08/15/2018, 11:48 AM

## 2018-08-15 NOTE — Progress Notes (Signed)
ANTICOAGULATION CONSULT NOTE - Initial Consult  Pharmacy Consult for heparin Indication: chest pain  Not on File  Patient Measurements: Height: 5\' 8"  (172.7 cm) Weight: 204 lb 8 oz (92.8 kg)(scale c) IBW/kg (Calculated) : 68.4 Heparin Dosing Weight: 88 kg  Vital Signs: Temp: 98.4 F (36.9 C) (08/23 2302) Temp Source: Oral (08/23 2302) BP: 150/68 (08/24 0545) Pulse Rate: 68 (08/24 0500)  Labs: Recent Labs    08/14/18 2318 08/15/18 2707 08/15/18 0921  HGB 11.7*  --   --   HCT 35.5*  --   --   PLT 142*  --   --   HEPARINUNFRC  --   --  0.45  CREATININE 1.24  --   --   TROPONINI  --  <0.03  --     Estimated Creatinine Clearance: 58.7 mL/min (by C-G formula based on SCr of 1.24 mg/dL).   Medical History: Past Medical History:  Diagnosis Date  . Anxiety   . Arthritis    "hands, lower back,; L4-5;  neck; C1-2" (03/31/2018)  . Asbestosis ([place])    "of my lungs"  . Asthma   . CAD (coronary artery disease)    CABG 14 days ago  . CAD S/P percutaneous coronary angioplasty 03/31/2018   LM PCI  . Chronic lower back pain   . Heart murmur   . High cholesterol   . Hypertension   . Myocardial infarction [facility]) 2006; 2007; 2012  . Prostate cancer [facility])    "put 3 beamers in my prostate in 11/2017 & radiation completed in 02/2018" (03/31/2018)  . Type II diabetes mellitus ([facility])     Medications:  See medication history  Assessment: 72 yo male from jail to start heparin for chest pain. Hx of CAD with stent placement 03/2018. On Aspirin and Brilinta PTA.  First Heparin level was therapuetic at 0.45, H&H 11.7/35.5 plts 142. No documenting bleeding.   Goal of Therapy:  Heparin level 0.3-0.7 units/ml Monitor platelets by anticoagulation protocol: Yes   Plan:  Heparin gtt at 1200 units/hr 8hr heparin level Daily HL and CBC while on heparin  Harrietta Guardian, PharmD PGY1 Pharmacy Resident 08/15/2018    10:43 AM

## 2018-08-15 NOTE — Progress Notes (Signed)
Called by RN regarding hematuria.  She states the patient had some frank blood from his penis when he was urinating last.  She states he also had some bright red blood per rectum when he had a bowel movement.  The patient told the RN that the symptoms are new.  He has been on aspirin and Brilinta without known bleeding.  As he is not having chest pain or other ischemic symptoms, I will stop the heparin for now.  Consider restarting it if the bleeding resolves and he has any additional ischemic symptoms.  Check UA, check a CBC in a.m.  Rosaria Ferries, PA-C 08/15/2018 2:40 PM Beeper [phone]

## 2018-08-15 NOTE — Progress Notes (Signed)
Pt has 3 times hematuria so far and HB result pending  Palma Holter, RN

## 2018-08-15 NOTE — Progress Notes (Addendum)
Cardiologist paged, UA sent, heparin stopped, pt has only a episode of bleeding so far, But since bleeding started after heparin drip and pt is in Brilinta since Home, it is not requires to stop (brilinta) that for now according to MD, will continue to monitor the patient  Palma Holter, RN

## 2018-08-15 NOTE — Progress Notes (Signed)
Pt having fresh blood in his stool and urine too for the second time, paged MD , it is 20 cc fresh blood. Psychological support given to the patient  [facility] RN

## 2018-08-15 NOTE — ED Provider Notes (Signed)
[place] EMERGENCY DEPARTMENT Provider Note   CSN: [id number] Arrival date & time: 08/14/18  2255     History   Chief Complaint Chief Complaint  Patient presents with  . Chest Pain    HPI Miguel Garcia is a 72 y.o. male.  The history is provided by the patient.  Chest Pain    He has history of coronary artery disease status post angioplasty and bypass, hypertension, hyperlipidemia, diabetes and comes in because of exertional chest pains over the last week.  He describes a tight feeling in his chest which radiates to the back with associated dyspnea and diaphoresis.  Symptoms come on with exertion and are relieved by resting for 2 minutes.  Initially, he had to walk about 150-200 yards before he would get chest discomfort.  Now, discomfort is coming on with walking about 75 yards.  He has not had any nocturnal symptoms.  Past Medical History:  Diagnosis Date  . Anxiety   . Arthritis    "hands, lower back,; L4-5;  neck; C1-2" (03/31/2018)  . Asbestosis ([place])    "of my lungs"  . Asthma   . CAD (coronary artery disease)    CABG 14 days ago  . CAD S/P percutaneous coronary angioplasty 03/31/2018   LM PCI  . Chronic lower back pain   . Heart murmur   . High cholesterol   . Hypertension   . Myocardial infarction [facility]) 2006; 2007; 2012  . Prostate cancer [facility])    "put 3 beamers in my prostate in 11/2017 & radiation completed in 02/2018" (03/31/2018)  . Type II diabetes mellitus [facility])     Patient Active Problem List   Diagnosis Date Noted  . Hx of CABG 04/01/2018  . CAD S/P percutaneous coronary angioplasty 04/01/2018  . Unstable angina ([place]) 04/01/2018  . History of prostate cancer 04/01/2018  . Dyslipidemia, goal LDL below 70 04/01/2018  . Anemia 04/01/2018  . Incarceration 04/01/2018  . Abnormal nuclear stress test     Past Surgical History:  Procedure Laterality Date  . BACK SURGERY    . CORONARY ANGIOPLASTY WITH STENT PLACEMENT  03/31/2018    . CORONARY ARTERY BYPASS GRAFT  2007  . CORONARY STENT INTERVENTION N/A 03/31/2018   Procedure: CORONARY STENT INTERVENTION;  Surgeon: Troy Sine, MD;  Location: [place] CV LAB;  Service: Cardiovascular;  Laterality: N/A;  . FOREARM FRACTURE SURGERY Right   . FRACTURE SURGERY    . HEMORRHOID BANDING    . LAPAROSCOPIC CHOLECYSTECTOMY    . LEFT HEART CATH AND CORONARY ANGIOGRAPHY N/A 03/31/2018   Procedure: LEFT HEART CATH AND CORONARY ANGIOGRAPHY;  Surgeon: Troy Sine, MD;  Location: [place] CV LAB;  Service: Cardiovascular;  Laterality: N/A;  . ORIF SHOULDER FRACTURE Left   . POSTERIOR FUSION LUMBAR SPINE     L4-5  . PROSTATE BIOPSY     "put in 3 [employer] Transponder Implants in 11/2017"        Home Medications    Prior to Admission medications   Medication Sig Start Date End Date Taking? Authorizing Provider  acetaminophen (TYLENOL) 325 MG tablet Take 2 tablets (650 mg total) by mouth every 4 (four) hours as needed for headache or mild pain. 04/01/18   Erlene Quan, PA-C  albuterol (PROVENTIL HFA;VENTOLIN HFA) 108 (90 Base) MCG/ACT inhaler Inhale 2 puffs into the lungs 4 (four) times daily as needed for wheezing or shortness of breath.    [provider]  aspirin EC  81 MG tablet Take 1 tablet (81 mg total) by mouth daily. 04/13/18   Lendon Colonel, NP  bicalutamide (CASODEX) 50 MG tablet Take 50 mg by mouth daily.    [provider]  docusate sodium (COLACE) 100 MG capsule Take 100 mg by mouth 2 (two) times daily.    [provider]  doxazosin (CARDURA) 4 MG tablet Take 4 mg by mouth at bedtime.    [provider]  gemfibrozil (LOPID) 600 MG tablet Take 600 mg by mouth 2 (two) times daily before a meal.    [provider]  glipiZIDE (GLUCOTROL XL) 10 MG 24 hr tablet Take 10 mg by mouth daily with breakfast.    [provider]  isosorbide mononitrate (IMDUR) 60 MG 24 hr tablet Take 1 tablet (60 mg total) by mouth  daily. 04/13/18   Lendon Colonel, NP  metoprolol tartrate (LOPRESSOR) 50 MG tablet Take 1 tablet (50 mg total) by mouth daily. 04/13/18   Lendon Colonel, NP  nitroGLYCERIN (NITROSTAT) 0.4 MG SL tablet Place 1 tablet (0.4 mg total) under the tongue every 5 (five) minutes as needed for chest pain. 04/13/18   Lendon Colonel, NP  phenazopyridine (PYRIDIUM) 200 MG tablet Take 200 mg by mouth 3 (three) times daily with meals.    [provider]  pioglitazone (ACTOS) 15 MG tablet Take 15 mg by mouth daily.    [provider]  pramoxine-zinc (TRONOLANE) 1-5 % rectal cream Place 28 g rectally every 2 (two) hours as needed for hemorrhoids.    [provider]  tamsulosin (FLOMAX) 0.4 MG CAPS capsule Take 0.4 mg by mouth 2 (two) times daily.    [provider]  ticagrelor (BRILINTA) 90 MG TABS tablet Take 1 tablet (90 mg total) by mouth 2 (two) times daily. 04/13/18   Lendon Colonel, NP    Family History Family History  Problem Relation Age of Onset  . Hypertension Mother     Social History Social History   Tobacco Use  . Smoking status: Never Smoker  . Smokeless tobacco: Former Systems developer    Types: Chew  Substance Use Topics  . Alcohol use: Not Currently  . Drug use: Not Currently     Allergies   Patient has no allergy information on record.   Review of Systems Review of Systems  Cardiovascular: Positive for chest pain.  All other systems reviewed and are negative.    Physical Exam Updated Vital Signs BP (!) 156/64   Pulse 70   Temp 98.4 F (36.9 C) (Oral)   Resp 18   Ht 5\' 8"  (1.727 m)   Wt 93.4 kg   SpO2 100%   BMI 31.32 kg/m   Physical Exam  Nursing note and vitals reviewed.  72 year old male, resting comfortably and in no acute distress. Vital signs are significant for elevated blood pressure. Oxygen saturation is 100%, which is normal. Head is normocephalic and atraumatic. PERRLA, EOMI. Oropharynx is clear. Neck is  nontender and supple without adenopathy or JVD. Back is nontender and there is no CVA tenderness. Lungs are clear without rales, wheezes, or rhonchi. Chest is nontender. Heart has regular rate and rhythm without murmur. Abdomen is soft, flat, nontender without masses or hepatosplenomegaly and peristalsis is normoactive. Extremities have no cyanosis or edema, full range of motion is present. Skin is warm and dry without rash. Neurologic: Mental status is normal, cranial nerves are intact, there are no motor or sensory deficits.  ED Treatments / Results  Labs (all labs ordered are listed, but only abnormal results are displayed) Labs Reviewed  BASIC METABOLIC PANEL - Abnormal; Notable for the following components:      Result Value   Glucose, Bld 111 (*)    BUN 27 (*)    GFR calc non Af Amer 56 (*)    All other components within normal limits  CBC - Abnormal; Notable for the following components:   RBC 3.69 (*)    Hemoglobin 11.7 (*)    HCT 35.5 (*)    Platelets 142 (*)    All other components within normal limits  I-STAT TROPONIN, ED    EKG EKG Interpretation  Date/Time:  Friday August 14 2018 23:02:14 EDT Ventricular Rate:  71 PR Interval:  170 QRS Duration: 82 QT Interval:  416 QTC Calculation: 452 R Axis:   33 Text Interpretation:  Normal sinus rhythm Normal ECG When compared with ECG of 04/01/2018, No significant change was found Confirmed by Delora Fuel ([id number]) on 08/15/2018 1:37:40 AM   Radiology Dg Chest 2 View  Result Date: 08/15/2018 CLINICAL DATA:  Chest pain. EXAM: CHEST - 2 VIEW COMPARISON:  None. FINDINGS: Post median sternotomy and CABG. Mild cardiomegaly. Aortic atherosclerosis. Bilateral calcified pleural plaques. No pulmonary edema. No confluent airspace disease, pleural effusion or pneumothorax. Degenerative change in the spine. IMPRESSION: 1. Cardiomegaly post CABG.  No evidence of CHF. 2. Bilateral calcified pleural plaques consistent with prior asbestos  exposure. Electronically Signed   By: Jeb Levering M.D.   On: 08/15/2018 00:38    Procedures Procedures  CRITICAL CARE Performed by: Delora Fuel Total critical care time: 35 minutes Critical care time was exclusive of separately billable procedures and treating other patients. Critical care was necessary to treat or prevent imminent or life-threatening deterioration. Critical care was time spent personally by me on the following activities: development of treatment plan with patient and/or surrogate as well as nursing, discussions with consultants, evaluation of patient's response to treatment, examination of patient, obtaining history from patient or surrogate, ordering and performing treatments and interventions, ordering and review of laboratory studies, ordering and review of radiographic studies, pulse oximetry and re-evaluation of patient's condition.  Medications Ordered in ED Medications  aspirin chewable tablet 324 mg (has no administration in time range)  heparin bolus via infusion 4,000 Units (has no administration in time range)  heparin ADULT infusion 100 units/mL (25000 units/21mL sodium chloride 0.45%) (has no administration in time range)     Initial Impression / Assessment and Plan / ED Course  I have reviewed the triage vital signs and the nursing notes.  Pertinent labs & imaging results that were available during my care of the patient were reviewed by me and considered in my medical decision making (see chart for details).  Chest discomfort with clinical picture suggestive of unstable angina.  Old records are reviewed, and he did have coronary stent placed in April of this year with numerous sub-critical blockages.  He is given aspirin and started on heparin.  Initial troponin is normal.  There is mild anemia which is unchanged from baseline.  Chest x-ray is unchanged from baseline.  Case is discussed with Dr. Blaine Hamper of [facility], who agrees to admit the  patient.  Case also discussed with Dr. Rhae Hammock of cardiology service who agrees to see the patient in consult.  Final Clinical Impressions(s) / ED Diagnoses   Final diagnoses:  Unstable angina (HCC)  Normochromic normocytic anemia  ED Discharge Orders    None       Delora Fuel, MD 00/52/59 [phone]

## 2018-08-16 DIAGNOSIS — N179 Acute kidney failure, unspecified: Secondary | ICD-10-CM | POA: Diagnosis not present

## 2018-08-16 DIAGNOSIS — Z9861 Coronary angioplasty status: Secondary | ICD-10-CM | POA: Diagnosis not present

## 2018-08-16 DIAGNOSIS — I200 Unstable angina: Secondary | ICD-10-CM | POA: Diagnosis not present

## 2018-08-16 DIAGNOSIS — I2510 Atherosclerotic heart disease of native coronary artery without angina pectoris: Secondary | ICD-10-CM | POA: Diagnosis not present

## 2018-08-16 LAB — URINALYSIS, ROUTINE W REFLEX MICROSCOPIC
BILIRUBIN URINE: NEGATIVE
Bacteria, UA: NONE SEEN
Glucose, UA: NEGATIVE mg/dL
HGB URINE DIPSTICK: NEGATIVE
KETONES UR: NEGATIVE mg/dL
Leukocytes, UA: NEGATIVE
NITRITE: POSITIVE — AB
PH: 5 (ref 5.0–8.0)
Protein, ur: NEGATIVE mg/dL
Specific Gravity, Urine: 1.009 (ref 1.005–1.030)

## 2018-08-16 LAB — GLUCOSE, CAPILLARY
GLUCOSE-CAPILLARY: 108 mg/dL — AB (ref 70–99)
GLUCOSE-CAPILLARY: 156 mg/dL — AB (ref 70–99)
Glucose-Capillary: 101 mg/dL — ABNORMAL HIGH (ref 70–99)
Glucose-Capillary: 133 mg/dL — ABNORMAL HIGH (ref 70–99)

## 2018-08-16 LAB — CBC
HEMATOCRIT: 33.6 % — AB (ref 39.0–52.0)
HEMOGLOBIN: 11.1 g/dL — AB (ref 13.0–17.0)
MCH: 31.3 pg (ref 26.0–34.0)
MCHC: 33 g/dL (ref 30.0–36.0)
MCV: 94.6 fL (ref 78.0–100.0)
Platelets: 143 10*3/uL — ABNORMAL LOW (ref 150–400)
RBC: 3.55 MIL/uL — AB (ref 4.22–5.81)
RDW: 13.8 % (ref 11.5–15.5)
WBC: 3.9 10*3/uL — AB (ref 4.0–10.5)

## 2018-08-16 LAB — OCCULT BLOOD X 1 CARD TO LAB, STOOL: Fecal Occult Bld: POSITIVE — AB

## 2018-08-16 MED ORDER — SODIUM CHLORIDE 0.9 % IV SOLN: 250.0000 mL | INTRAVENOUS | Status: DC | PRN

## 2018-08-16 MED ORDER — SODIUM CHLORIDE 0.9 % WEIGHT BASED INFUSION: 1.0000 mL/kg/h | INTRAVENOUS | Status: DC

## 2018-08-16 MED ORDER — ASPIRIN 81 MG PO CHEW
81.0000 mg | CHEWABLE_TABLET | ORAL | Status: AC
  Administered 2018-08-17: 81 mg via ORAL
  Filled 2018-08-16: qty 1

## 2018-08-16 MED ORDER — SODIUM CHLORIDE 0.9% FLUSH: 3.0000 mL | INTRAVENOUS | Status: DC | PRN

## 2018-08-16 MED ORDER — SODIUM CHLORIDE 0.9 % WEIGHT BASED INFUSION: 3.0000 mL/kg/h | INTRAVENOUS | Status: DC

## 2018-08-16 MED ORDER — SODIUM CHLORIDE 0.9% FLUSH: 3.0000 mL | Freq: Two times a day (BID) | INTRAVENOUS | Status: DC

## 2018-08-16 NOTE — Progress Notes (Addendum)
Stool for occult blood result is positive, paged MD, pt stable, vitals stable, denies chest pain and SOB, No hematuria so far, consent taken for surgery tomorrow, pt is aware of being NPO since midnight  6.23pm MD called back and aware of the situation  [place], Therapist, sports

## 2018-08-16 NOTE — Progress Notes (Signed)
PROGRESS NOTE  Miguel Garcia VFI:[id number] DOB: [birth year] DOA: 08/14/2018 PCP: Patient, No Pcp Per  HPI/Recap of past 24 hours: HPI: Miguel Garcia is a 72 y.o. male with medical history significant of hypertension, hyperlipidemia, diabetes mellitus, asthma, asbestosis, anxiety, CAD, stent placement 03/2018, CABG 2007, prostate cancer (radiation therapy), who presents with chest pain.  Patient was admitted to observation early this morning.  He was started on aspirin Brilinta and heparin and he is scheduled to have cardiac catheterization on Monday. Nurse calls because patient is having some bright red blood small amount in her urine and in his stool.  Nurse are advised to hold heparin , she will call cardiology about holding Brilinta and aspirin    Assessment/Plan: Principal Problem:   Chest pain Active Problems:   Hx of CABG   CAD S/P percutaneous coronary angioplasty   Dyslipidemia, goal LDL below 70   Essential hypertension   Diabetes mellitus without complication ([place])   Prostate cancer ([place])   AKI (acute kidney injury) ([place])  Angina:  Need relook cath given complexity of circumflex intervention 4 months ago Heparin d/c since r/o Hold asa today continue Brillinta  Patient to proceed with cardiac catheterization in the morning if hemoglobin stable and no more bleeding HLD:  Continue statin   Hematuria:  UA only nitrite positive Hb negative leukocyte negative don't think needs antibiotics  Melena:  ?? Recent 2 stools ok guaic needed Hct is fine 33.6     DVT ppx:  Heparin discontinued due to bleeding from GI as well as hematuria Code Status: Full code Family Communication:    Yes, patient's police officer at bed side Disposition Plan:  Anticipate discharge back to [facility] called: Cardiology, Dr. Rhae Hammock was consulted. Admission status: Obs / tele   Procedures:  none   Antimicrobials:  none    Objective: Vitals:   08/16/18 0457 08/16/18 1218    BP: 133/61 129/65  Pulse: 62 62  Resp: 18 20  Temp: (!) 97.5 F (36.4 C) 98.3 F (36.8 C)  SpO2: 97% 99%    Intake/Output Summary (Last 24 hours) at 08/16/2018 1810 Last data filed at 08/16/2018 1100 Gross per 24 hour  Intake -  Output 2050 ml  Net -2050 ml   Filed Weights   08/14/18 2312 08/15/18 0545 08/16/18 0500  Weight: 93.4 kg 92.8 kg 89.2 kg   Body mass index is 29.89 kg/m.  Exam:  General: Not in acute distress HEENT:       Eyes: PERRL, EOMI, no scleral icterus.       ENT: No discharge from the ears and nose, no pharynx injection, no tonsillar enlargement.        Neck: No JVD, no bruit, no mass felt. Heme: No neck lymph node enlargement. Cardiac: S1/S2, RRR, No murmurs, No gallops or rubs. Respiratory: Good air movement bilaterally. No rales, wheezing, rhonchi or rubs. GI: Soft, nondistended, nontender, no rebound pain, no organomegaly, BS present. GU: No hematuria Ext: No pitting leg edema bilaterally. 2+DP/PT pulse bilaterally. Musculoskeletal: No joint deformities, No joint redness or warmth, no limitation of ROM in spin. Skin: No rashes.  Neuro: Alert, oriented X3, cranial nerves II-XII grossly intact, moves all extremities normally. Muscle strength 5/5 in all extremities, sensation to light touch intact. Brachial reflex 2+ bilaterally. Knee reflex 1+ bilaterally. Negative Babinski's sign. Normal finger to nose test. Psych: Patient is not psychotic, no suicidal or hemocidal ideation.  Data Reviewed: CBC: Recent Labs  Lab 08/14/18 2318 08/15/18 1203 08/16/18  0611  WBC 4.7 3.6* 3.9*  HGB 11.7* 10.3* 11.1*  HCT 35.5* 31.0* 33.6*  MCV 96.2 94.8 94.6  PLT 142* 139* 825*   Basic Metabolic Panel: Recent Labs  Lab 08/14/18 2318  NA 139  K 4.1  CL 108  CO2 22  GLUCOSE 111*  BUN 27*  CREATININE 1.24  CALCIUM 9.7   GFR: Estimated Creatinine Clearance: 57.6 mL/min (by C-G formula based on SCr of 1.24 mg/dL). Liver Function Tests: No results for  input(s): AST, ALT, ALKPHOS, BILITOT, PROT, ALBUMIN in the last 168 hours. No results for input(s): LIPASE, AMYLASE in the last 168 hours. No results for input(s): AMMONIA in the last 168 hours. Coagulation Profile: No results for input(s): INR, PROTIME in the last 168 hours. Cardiac Enzymes: Recent Labs  Lab 08/15/18 0539 08/15/18 1203 08/15/18 1802  TROPONINI <0.03 <0.03 <0.03   BNP (last 3 results) No results for input(s): PROBNP in the last 8760 hours. HbA1C: Recent Labs    08/15/18 0632  HGBA1C 6.1*   CBG: Recent Labs  Lab 08/15/18 1630 08/15/18 2110 08/16/18 0730 08/16/18 1217 08/16/18 1623  GLUCAP 119* 107* 101* 108* 156*   Lipid Profile: Recent Labs    08/15/18 0632  CHOL 119  HDL 34*  LDLCALC 69  TRIG 80  CHOLHDL 3.5   Thyroid Function Tests: No results for input(s): TSH, T4TOTAL, FREET4, T3FREE, THYROIDAB in the last 72 hours. Anemia Panel: No results for input(s): VITAMINB12, FOLATE, FERRITIN, TIBC, IRON, RETICCTPCT in the last 72 hours. Urine analysis:    Component Value Date/Time   COLORURINE AMBER (A) 08/16/2018 0547   APPEARANCEUR CLEAR 08/16/2018 0547   LABSPEC 1.009 08/16/2018 0547   PHURINE 5.0 08/16/2018 0547   GLUCOSEU NEGATIVE 08/16/2018 0547   HGBUR NEGATIVE 08/16/2018 0547   BILIRUBINUR NEGATIVE 08/16/2018 0547   KETONESUR NEGATIVE 08/16/2018 0547   PROTEINUR NEGATIVE 08/16/2018 0547   NITRITE POSITIVE (A) 08/16/2018 0547   LEUKOCYTESUR NEGATIVE 08/16/2018 0547   Sepsis Labs: @LABRCNTIP (procalcitonin:4,lacticidven:4)  ) Recent Results (from the past 240 hour(s))  MRSA PCR Screening     Status: None   Collection Time: 08/15/18  6:48 AM  Result Value Ref Range Status   MRSA by PCR NEGATIVE NEGATIVE Final    Comment:        The GeneXpert MRSA Assay (FDA approved for NASAL specimens only), is one component of a comprehensive MRSA colonization surveillance program. It is not intended to diagnose MRSA infection nor to guide  or monitor treatment for MRSA infections. Performed at [facility], [place] [address], [place], [place] [address]       Studies: No results found.  Scheduled Meds: . aspirin EC  81 mg Oral Daily  . atorvastatin  80 mg Oral q1800  . bicalutamide  50 mg Oral Daily  . docusate sodium  100 mg Oral BID  . gabapentin  300 mg Oral BID  . insulin aspart  0-5 Units Subcutaneous QHS  . insulin aspart  0-9 Units Subcutaneous TID WC  . isosorbide mononitrate  60 mg Oral Daily  . metoprolol tartrate  50 mg Oral Daily  . tamsulosin  0.4 mg Oral BID  . ticagrelor  90 mg Oral BID    Continuous Infusions: . sodium chloride       LOS: 0 days     Cristal Deer, MD [facility]  To reach me or the doctor on call, go to: [email or web address] Pager #[phone]  08/16/2018, 6:10 PM

## 2018-08-16 NOTE — Progress Notes (Signed)
Progress Note  Patient Name: Miguel Garcia Date of Encounter: 08/16/2018  Primary Cardiologist:  Claiborne Billings  Subjective   No pain Indicating blood with BM "lots" but none last BM Urine also "amber"   Inpatient Medications    Scheduled Meds: . aspirin EC  81 mg Oral Daily  . atorvastatin  80 mg Oral q1800  . bicalutamide  50 mg Oral Daily  . docusate sodium  100 mg Oral BID  . gabapentin  300 mg Oral BID  . gemfibrozil  600 mg Oral BID AC  . insulin aspart  0-5 Units Subcutaneous QHS  . insulin aspart  0-9 Units Subcutaneous TID WC  . isosorbide mononitrate  60 mg Oral Daily  . metoprolol tartrate  50 mg Oral Daily  . phenazopyridine  200 mg Oral TID WC  . tamsulosin  0.4 mg Oral BID  . ticagrelor  90 mg Oral BID   Continuous Infusions: . sodium chloride     PRN Meds: acetaminophen, albuterol, ALPRAZolam, hydrALAZINE, hydrocortisone, morphine injection, nitroGLYCERIN, ondansetron (ZOFRAN) IV, zolpidem   Vital Signs    Vitals:   08/15/18 1927 08/15/18 2335 08/16/18 0457 08/16/18 0500  BP: 120/60 (!) 118/53 133/61   Pulse: 64 63 62   Resp: 18 20 18    Temp: 98.4 F (36.9 C) 98.2 F (36.8 C) (!) 97.5 F (36.4 C)   TempSrc: Oral Oral Oral   SpO2: 97% 97% 97%   Weight:    89.2 kg  Height:        Intake/Output Summary (Last 24 hours) at 08/16/2018 0952 Last data filed at 08/16/2018 0743 Gross per 24 hour  Intake 879.87 ml  Output 2500 ml  Net -1620.13 ml   Filed Weights   08/14/18 2312 08/15/18 0545 08/16/18 0500  Weight: 93.4 kg 92.8 kg 89.2 kg    Telemetry    NSR 08/16/2018 - Personally Reviewed  ECG    SR rate 71 no acute changes - Personally Reviewed  Physical Exam  Post sternotomy  GEN: No acute distress.   Neck: No JVD Cardiac: RRR, no murmurs, rubs, or gallops.  Respiratory: Clear to auscultation bilaterally. GI: Soft, nontender, non-distended  MS: No edema; No deformity. Neuro:  Nonfocal  Psych: Normal affect   Labs     Chemistry Recent Labs  Lab 08/14/18 2318  NA 139  K 4.1  CL 108  CO2 22  GLUCOSE 111*  BUN 27*  CREATININE 1.24  CALCIUM 9.7  GFRNONAA 56*  GFRAA >60  ANIONGAP 9     Hematology Recent Labs  Lab 08/14/18 2318 08/15/18 1203 08/16/18 0611  WBC 4.7 3.6* 3.9*  RBC 3.69* 3.27* 3.55*  HGB 11.7* 10.3* 11.1*  HCT 35.5* 31.0* 33.6*  MCV 96.2 94.8 94.6  MCH 31.7 31.5 31.3  MCHC 33.0 33.2 33.0  RDW 13.9 13.8 13.8  PLT 142* 139* 143*    Cardiac Enzymes Recent Labs  Lab 08/15/18 0632 08/15/18 1203 08/15/18 1802  TROPONINI <0.03 <0.03 <0.03    Recent Labs  Lab 08/14/18 2317 08/15/18 0313  TROPIPOC 0.05 0.06     BNPNo results for input(s): BNP, PROBNP in the last 168 hours.   DDimer No results for input(s): DDIMER in the last 168 hours.   Radiology    Dg Chest 2 View  Result Date: 08/15/2018 CLINICAL DATA:  Chest pain. EXAM: CHEST - 2 VIEW COMPARISON:  None. FINDINGS: Post median sternotomy and CABG. Mild cardiomegaly. Aortic atherosclerosis. Bilateral calcified pleural plaques. No pulmonary edema. No confluent  airspace disease, pleural effusion or pneumothorax. Degenerative change in the spine. IMPRESSION: 1. Cardiomegaly post CABG.  No evidence of CHF. 2. 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 Electronically Signed   By: Jeb Levering M.D.   On: 08/15/2018 00:38   Ct Angio Chest/abd/pel For Dissection W And/or W/wo  Result Date: 08/15/2018 CLINICAL DATA:  Chest pain radiating to the back. EXAM: CT ANGIOGRAPHY CHEST, ABDOMEN AND PELVIS TECHNIQUE: Multidetector CT imaging through the chest, abdomen and pelvis was performed using the standard protocol during bolus administration of intravenous contrast. Multiplanar reconstructed images and MIPs were obtained and reviewed to evaluate the vascular anatomy. CONTRAST:  147mL ISOVUE-370 IOPAMIDOL (ISOVUE-370) INJECTION 76% COMPARISON:  Radiograph earlier this day. FINDINGS: CTA CHEST  FINDINGS Cardiovascular: Aortic atherosclerosis without dissection, hematoma, aneurysm, acute aortic syndrome or vasculitis. Heart is normal in size. Post CABG with dense calcification of native coronary arteries. No pericardial effusion. No central pulmonary embolus. Mediastinum/Nodes: No enlarged mediastinal or hilar lymph nodes. Thyroid gland is normal. The esophagus is decompressed. Lungs/Pleura: Multiple bilateral calcified pleural plaques. No focal consolidation. No pulmonary edema. No pleural fluid. Trachea and mainstem bronchi are patent. Linear atelectasis in the lower lobes, left greater than right. Musculoskeletal: Post median sternotomy. Degenerative change throughout spine. There are no acute or suspicious osseous abnormalities. Review of the MIP images confirms the above findings. CTA ABDOMEN AND PELVIS FINDINGS VASCULAR Aorta: Moderate atherosclerosis without aneurysm, dissection or significant stenosis. No evidence of vasculitis. 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 SMA: Patent without evidence of aneurysm, dissection, vasculitis or significant stenosis. Renals: Plaque at the origin with approximately 50% stenosis on the right. No aneurysm or dissection. IMA: Patent without evidence of aneurysm, dissection, vasculitis or significant stenosis. Inflow: Patent without evidence of aneurysm, dissection, vasculitis or significant stenosis. Veins: No obvious venous abnormality within the limitations of this arterial phase study. Review of the MIP images confirms the above findings. NON-VASCULAR Hepatobiliary: No focal hepatic lesion on arterial phase imaging. Postcholecystectomy without biliary dilatation. Pancreas: No ductal dilatation or inflammation. Spleen: Normal arterial phase enhancement.  Normal in size. Adrenals/Urinary Tract: Normal adrenal glands. No hydronephrosis. Mild nonspecific  perinephric edema. Urinary bladder is distended. Stomach/Bowel: Stomach is nondistended. No evidence of bowel wall thickening, inflammatory change or obstruction. Descending and sigmoid colonic diverticulosis without diverticulitis. Normal appendix. Lymphatic: No enlarged abdominal or pelvic lymph nodes. 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 Other: No free air or ascites. Musculoskeletal: There are no acute or suspicious osseous abnormalities. Review of the MIP images confirms the above findings. IMPRESSION: 1. Diffuse aortic atherosclerosis without dissection or acute aortic abnormality. 2. 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 3. No acute chest finding. Calcified pleural plaques consistent with prior asbestos exposure. 4. No acute abdominopelvic finding. Incidental finding of colonic diverticulosis without diverticulitis. Electronically Signed   By: Jeb Levering M.D.   On: 08/15/2018 06:29    Cardiac Studies   TTE Pending   Patient Profile     72 y.o. male with previous CABG. April 08/2018 cath with moderate RCA disease, LIMA patent To LAD and complex stenting of LM and large area of proximal and mid circumflex. Admitted with 2 weeks SSCP consistent with angina Enzymes negative no acute ECG changes  Assessment & Plan    Angina:  Need relook cath given complexity of circumflex intervention 4 months ago Heparin d/c since r/o Hold  asa today continue Brillinta   HLD:  Continue statin   Hematuria:  UA only nitrite positive Hb negative leukocyte negative don't think needs antibiotics  Melena:  ?? Recent 2 stools ok guaic needed Hct is fine 33.6   Will evaluate in am but hopefully can proceed with cath in am       For questions or updates, please contact [place] Please consult [email or web address] for contact info under Cardiology/STEMI.      Signed, Jenkins Rouge, MD  08/16/2018, 9:52 AM

## 2018-08-17 ENCOUNTER — Inpatient Hospital Stay (HOSPITAL_COMMUNITY): Admission: EM | Payer: Self-pay | Attending: Family Medicine

## 2018-08-17 DIAGNOSIS — I209 Angina pectoris, unspecified: Secondary | ICD-10-CM

## 2018-08-17 DIAGNOSIS — I252 Old myocardial infarction: Secondary | ICD-10-CM | POA: Diagnosis not present

## 2018-08-17 DIAGNOSIS — K921 Melena: Secondary | ICD-10-CM | POA: Diagnosis not present

## 2018-08-17 DIAGNOSIS — E785 Hyperlipidemia, unspecified: Secondary | ICD-10-CM | POA: Diagnosis present

## 2018-08-17 DIAGNOSIS — J61 Pneumoconiosis due to asbestos and other mineral fibers: Secondary | ICD-10-CM | POA: Diagnosis present

## 2018-08-17 DIAGNOSIS — Z9049 Acquired absence of other specified parts of digestive tract: Secondary | ICD-10-CM | POA: Diagnosis not present

## 2018-08-17 DIAGNOSIS — N179 Acute kidney failure, unspecified: Secondary | ICD-10-CM | POA: Diagnosis not present

## 2018-08-17 DIAGNOSIS — I2510 Atherosclerotic heart disease of native coronary artery without angina pectoris: Secondary | ICD-10-CM | POA: Diagnosis not present

## 2018-08-17 DIAGNOSIS — Z923 Personal history of irradiation: Secondary | ICD-10-CM | POA: Diagnosis not present

## 2018-08-17 DIAGNOSIS — E119 Type 2 diabetes mellitus without complications: Secondary | ICD-10-CM | POA: Diagnosis not present

## 2018-08-17 DIAGNOSIS — Z951 Presence of aortocoronary bypass graft: Secondary | ICD-10-CM | POA: Diagnosis not present

## 2018-08-17 DIAGNOSIS — I200 Unstable angina: Secondary | ICD-10-CM | POA: Diagnosis present

## 2018-08-17 DIAGNOSIS — E876 Hypokalemia: Secondary | ICD-10-CM | POA: Diagnosis present

## 2018-08-17 DIAGNOSIS — Z955 Presence of coronary angioplasty implant and graft: Secondary | ICD-10-CM | POA: Diagnosis not present

## 2018-08-17 DIAGNOSIS — R319 Hematuria, unspecified: Secondary | ICD-10-CM | POA: Diagnosis not present

## 2018-08-17 DIAGNOSIS — I25110 Atherosclerotic heart disease of native coronary artery with unstable angina pectoris: Secondary | ICD-10-CM | POA: Diagnosis present

## 2018-08-17 DIAGNOSIS — Y838 Other surgical procedures as the cause of abnormal reaction of the patient, or of later complication, without mention of misadventure at the time of the procedure: Secondary | ICD-10-CM | POA: Diagnosis present

## 2018-08-17 DIAGNOSIS — Z981 Arthrodesis status: Secondary | ICD-10-CM | POA: Diagnosis not present

## 2018-08-17 DIAGNOSIS — I5032 Chronic diastolic (congestive) heart failure: Secondary | ICD-10-CM | POA: Diagnosis present

## 2018-08-17 DIAGNOSIS — Z7982 Long term (current) use of aspirin: Secondary | ICD-10-CM | POA: Diagnosis not present

## 2018-08-17 DIAGNOSIS — T82855A Stenosis of coronary artery stent, initial encounter: Secondary | ICD-10-CM | POA: Diagnosis present

## 2018-08-17 DIAGNOSIS — E1151 Type 2 diabetes mellitus with diabetic peripheral angiopathy without gangrene: Secondary | ICD-10-CM | POA: Diagnosis present

## 2018-08-17 DIAGNOSIS — D62 Acute posthemorrhagic anemia: Secondary | ICD-10-CM | POA: Diagnosis not present

## 2018-08-17 DIAGNOSIS — Z8546 Personal history of malignant neoplasm of prostate: Secondary | ICD-10-CM | POA: Diagnosis not present

## 2018-08-17 DIAGNOSIS — D696 Thrombocytopenia, unspecified: Secondary | ICD-10-CM | POA: Diagnosis present

## 2018-08-17 DIAGNOSIS — Z9861 Coronary angioplasty status: Secondary | ICD-10-CM

## 2018-08-17 DIAGNOSIS — I110 Hypertensive heart disease with heart failure: Secondary | ICD-10-CM | POA: Diagnosis present

## 2018-08-17 DIAGNOSIS — F419 Anxiety disorder, unspecified: Secondary | ICD-10-CM | POA: Diagnosis present

## 2018-08-17 DIAGNOSIS — I10 Essential (primary) hypertension: Secondary | ICD-10-CM | POA: Diagnosis not present

## 2018-08-17 DIAGNOSIS — Z7902 Long term (current) use of antithrombotics/antiplatelets: Secondary | ICD-10-CM | POA: Diagnosis not present

## 2018-08-17 DIAGNOSIS — R011 Cardiac murmur, unspecified: Secondary | ICD-10-CM | POA: Diagnosis present

## 2018-08-17 DIAGNOSIS — D649 Anemia, unspecified: Secondary | ICD-10-CM | POA: Diagnosis not present

## 2018-08-17 DIAGNOSIS — K5731 Diverticulosis of large intestine without perforation or abscess with bleeding: Secondary | ICD-10-CM | POA: Diagnosis not present

## 2018-08-17 HISTORY — PX: CORONARY BALLOON ANGIOPLASTY: CATH118233

## 2018-08-17 HISTORY — PX: CORONARY STENT INTERVENTION: CATH118234

## 2018-08-17 HISTORY — PX: LEFT HEART CATH AND CORS/GRAFTS ANGIOGRAPHY: CATH118250

## 2018-08-17 LAB — BASIC METABOLIC PANEL
Anion gap: 9 (ref 5–15)
BUN: 22 mg/dL (ref 8–23)
CALCIUM: 9.5 mg/dL (ref 8.9–10.3)
CO2: 23 mmol/L (ref 22–32)
Chloride: 107 mmol/L (ref 98–111)
Creatinine, Ser: 1.04 mg/dL (ref 0.61–1.24)
GLUCOSE: 121 mg/dL — AB (ref 70–99)
POTASSIUM: 3.8 mmol/L (ref 3.5–5.1)
SODIUM: 139 mmol/L (ref 135–145)

## 2018-08-17 LAB — CBC
HCT: 34.1 % — ABNORMAL LOW (ref 39.0–52.0)
Hemoglobin: 11.4 g/dL — ABNORMAL LOW (ref 13.0–17.0)
MCH: 31.3 pg (ref 26.0–34.0)
MCHC: 33.4 g/dL (ref 30.0–36.0)
MCV: 93.7 fL (ref 78.0–100.0)
PLATELETS: 148 10*3/uL — AB (ref 150–400)
RBC: 3.64 MIL/uL — AB (ref 4.22–5.81)
RDW: 13.7 % (ref 11.5–15.5)
WBC: 4.3 10*3/uL (ref 4.0–10.5)

## 2018-08-17 LAB — PROTIME-INR
INR: 1.1
PROTHROMBIN TIME: 14.1 s (ref 11.4–15.2)

## 2018-08-17 LAB — GLUCOSE, CAPILLARY
GLUCOSE-CAPILLARY: 116 mg/dL — AB (ref 70–99)
GLUCOSE-CAPILLARY: 118 mg/dL — AB (ref 70–99)
GLUCOSE-CAPILLARY: 91 mg/dL (ref 70–99)
GLUCOSE-CAPILLARY: 102 mg/dL — AB (ref 70–99)
GLUCOSE-CAPILLARY: 97 mg/dL (ref 70–99)
GLUCOSE-CAPILLARY: 156 mg/dL — AB (ref 70–99)

## 2018-08-17 LAB — POCT ACTIVATED CLOTTING TIME: Activated Clotting Time: 417 seconds

## 2018-08-17 LAB — TROPONIN I: TROPONIN I: 0.03 ng/mL — AB (ref <0.03)

## 2018-08-17 SURGERY — LEFT HEART CATH AND CORS/GRAFTS ANGIOGRAPHY
Anesthesia: LOCAL

## 2018-08-17 MED ORDER — SODIUM CHLORIDE 0.9 % IV SOLN
INTRAVENOUS | Status: AC | PRN
  Administered 2018-08-17 (×2): 1.75 mg/kg/h via INTRAVENOUS

## 2018-08-17 MED ORDER — TICAGRELOR 90 MG PO TABS
90.0000 mg | ORAL_TABLET | ORAL | Status: AC
  Administered 2018-08-17: 90 mg via ORAL
  Filled 2018-08-17: qty 1

## 2018-08-17 MED ORDER — ASPIRIN 81 MG PO CHEW
81.0000 mg | CHEWABLE_TABLET | Freq: Every day | ORAL | Status: DC
  Administered 2018-08-18 – 2018-08-19 (×2): 81 mg via ORAL
  Filled 2018-08-17 (×2): qty 1

## 2018-08-17 MED ORDER — SODIUM CHLORIDE 0.9% FLUSH
3.0000 mL | Freq: Two times a day (BID) | INTRAVENOUS | Status: DC
  Administered 2018-08-18 – 2018-08-19 (×4): 3 mL via INTRAVENOUS

## 2018-08-17 MED ORDER — BIVALIRUDIN BOLUS VIA INFUSION - CUPID
INTRAVENOUS | Status: DC | PRN
  Administered 2018-08-17: 66.225 mg via INTRAVENOUS

## 2018-08-17 MED ORDER — BIVALIRUDIN TRIFLUOROACETATE 250 MG IV SOLR
INTRAVENOUS | Status: AC
  Filled 2018-08-17: qty 250

## 2018-08-17 MED ORDER — ZOLPIDEM TARTRATE 5 MG PO TABS
5.0000 mg | ORAL_TABLET | Freq: Every evening | ORAL | Status: DC | PRN
  Administered 2018-08-18: 5 mg via ORAL
  Filled 2018-08-17: qty 1

## 2018-08-17 MED ORDER — SODIUM CHLORIDE 0.9% FLUSH: 3.0000 mL | INTRAVENOUS | Status: DC | PRN

## 2018-08-17 MED ORDER — HEPARIN (PORCINE) IN NACL 1000-0.9 UT/500ML-% IV SOLN
INTRAVENOUS | Status: AC
  Filled 2018-08-17: qty 1000

## 2018-08-17 MED ORDER — SODIUM CHLORIDE 0.9 % WEIGHT BASED INFUSION
3.0000 mL/kg/h | INTRAVENOUS | Status: DC
  Administered 2018-08-17: 3 mL/kg/h via INTRAVENOUS

## 2018-08-17 MED ORDER — TICAGRELOR 90 MG PO TABS: 90.0000 mg | ORAL_TABLET | Freq: Two times a day (BID) | ORAL | Status: DC

## 2018-08-17 MED ORDER — NITROGLYCERIN 1 MG/10 ML FOR IR/CATH LAB
INTRA_ARTERIAL | Status: AC
  Filled 2018-08-17: qty 10

## 2018-08-17 MED ORDER — HEPARIN (PORCINE) IN NACL 1000-0.9 UT/500ML-% IV SOLN
INTRAVENOUS | Status: DC | PRN
  Administered 2018-08-17 (×2): 500 mL

## 2018-08-17 MED ORDER — FLUTICASONE PROPIONATE 50 MCG/ACT NA SUSP
2.0000 | Freq: Every day | NASAL | Status: DC
  Administered 2018-08-19: 2 via NASAL
  Filled 2018-08-17 (×2): qty 16

## 2018-08-17 MED ORDER — FENTANYL CITRATE (PF) 100 MCG/2ML IJ SOLN
INTRAMUSCULAR | Status: AC
  Filled 2018-08-17: qty 2

## 2018-08-17 MED ORDER — LIDOCAINE HCL (PF) 1 % IJ SOLN
INTRAMUSCULAR | Status: AC
  Filled 2018-08-17: qty 30

## 2018-08-17 MED ORDER — HYDRALAZINE HCL 20 MG/ML IJ SOLN: 5.0000 mg | INTRAMUSCULAR | Status: AC | PRN

## 2018-08-17 MED ORDER — SODIUM CHLORIDE 0.9 % WEIGHT BASED INFUSION
1.0000 mL/kg/h | INTRAVENOUS | Status: DC
  Administered 2018-08-17: 1 mL/kg/h via INTRAVENOUS

## 2018-08-17 MED ORDER — FENTANYL CITRATE (PF) 100 MCG/2ML IJ SOLN
INTRAMUSCULAR | Status: DC | PRN
  Administered 2018-08-17 (×2): 25 ug via INTRAVENOUS

## 2018-08-17 MED ORDER — MIDAZOLAM HCL 2 MG/2ML IJ SOLN
INTRAMUSCULAR | Status: AC
  Filled 2018-08-17: qty 2

## 2018-08-17 MED ORDER — LIDOCAINE HCL (PF) 1 % IJ SOLN
INTRAMUSCULAR | Status: DC | PRN
  Administered 2018-08-17: 15 mL

## 2018-08-17 MED ORDER — MIDAZOLAM HCL 2 MG/2ML IJ SOLN
INTRAMUSCULAR | Status: DC | PRN
  Administered 2018-08-17: 1 mg via INTRAVENOUS
  Administered 2018-08-17: 2 mg via INTRAVENOUS

## 2018-08-17 MED ORDER — HEPARIN (PORCINE) IN NACL 1000-0.9 UT/500ML-% IV SOLN
INTRAVENOUS | Status: DC | PRN
  Administered 2018-08-17: 500 mL

## 2018-08-17 MED ORDER — ONDANSETRON HCL 4 MG/2ML IJ SOLN: 4.0000 mg | Freq: Four times a day (QID) | INTRAMUSCULAR | Status: DC | PRN

## 2018-08-17 MED ORDER — ATROPINE SULFATE 1 MG/10ML IJ SOSY
PREFILLED_SYRINGE | INTRAMUSCULAR | Status: AC
  Filled 2018-08-17: qty 10

## 2018-08-17 MED ORDER — IOHEXOL 350 MG/ML SOLN
INTRAVENOUS | Status: DC | PRN
  Administered 2018-08-17: 235 mL via INTRA_ARTERIAL

## 2018-08-17 MED ORDER — ACETAMINOPHEN 325 MG PO TABS
650.0000 mg | ORAL_TABLET | ORAL | Status: DC | PRN
  Administered 2018-08-18: 650 mg via ORAL
  Filled 2018-08-17: qty 2

## 2018-08-17 MED ORDER — ATORVASTATIN CALCIUM 80 MG PO TABS
80.0000 mg | ORAL_TABLET | Freq: Every day | ORAL | Status: DC
  Administered 2018-08-18 – 2018-08-19 (×2): 80 mg via ORAL
  Filled 2018-08-17 (×2): qty 1

## 2018-08-17 MED ORDER — NITROGLYCERIN 1 MG/10 ML FOR IR/CATH LAB
INTRA_ARTERIAL | Status: DC | PRN
  Administered 2018-08-17 (×6): 200 ug via INTRACORONARY

## 2018-08-17 MED ORDER — SODIUM CHLORIDE 0.9 % IV SOLN: 250.0000 mL | INTRAVENOUS | Status: DC | PRN

## 2018-08-17 MED ORDER — DIAZEPAM 5 MG PO TABS
5.0000 mg | ORAL_TABLET | Freq: Four times a day (QID) | ORAL | Status: DC | PRN
  Administered 2018-08-18 (×2): 5 mg via ORAL
  Filled 2018-08-17 (×2): qty 1

## 2018-08-17 MED ORDER — LABETALOL HCL 5 MG/ML IV SOLN: 10.0000 mg | INTRAVENOUS | Status: AC | PRN

## 2018-08-17 MED ORDER — SODIUM CHLORIDE 0.9 % IV SOLN
INTRAVENOUS | Status: DC
  Administered 2018-08-17 – 2018-08-18 (×2): via INTRAVENOUS

## 2018-08-17 SURGICAL SUPPLY — 22 items
BALLN SAPPHIRE 2.0X12 (BALLOONS) ×2
BALLN SAPPHIRE ~~LOC~~ 2.25X12 (BALLOONS) ×2 IMPLANT
BALLN SAPPHIRE ~~LOC~~ 3.5X12 (BALLOONS) ×2 IMPLANT
BALLN WOLVERINE 3.00X10 (BALLOONS) ×2
BALLOON SAPPHIRE 2.0X12 (BALLOONS) ×1 IMPLANT
BALLOON WOLVERINE 3.00X10 (BALLOONS) ×1 IMPLANT
CATH INFINITI 5 FR IM (CATHETERS) ×2 IMPLANT
CATH INFINITI 5FR MULTPACK ANG (CATHETERS) ×2 IMPLANT
CATH VISTA GUIDE 6FR XB4 (CATHETERS) ×2 IMPLANT
ELECT DEFIB PAD ADLT CADENCE (PAD) ×2 IMPLANT
KIT ENCORE 26 ADVANTAGE (KITS) ×2 IMPLANT
KIT HEART LEFT (KITS) ×2 IMPLANT
PACK CARDIAC CATHETERIZATION (CUSTOM PROCEDURE TRAY) ×2 IMPLANT
SHEATH PINNACLE 5F 10CM (SHEATH) ×2 IMPLANT
SHEATH PINNACLE 6F 10CM (SHEATH) ×2 IMPLANT
STENT RESOLUTE ONYX 2.0X12 (Permanent Stent) ×2 IMPLANT
TRANSDUCER W/STOPCOCK (MISCELLANEOUS) ×2 IMPLANT
TUBING CIL FLEX 10 FLL-RA (TUBING) ×2 IMPLANT
WIRE EMERALD 3MM-J .035X150CM (WIRE) ×2 IMPLANT
WIRE EMERALD 3MM-J .035X260CM (WIRE) ×2 IMPLANT
WIRE MARVEL STR TIP 190CM (WIRE) ×2 IMPLANT
WIRE PT2 MS 185 (WIRE) ×2 IMPLANT

## 2018-08-17 NOTE — Progress Notes (Addendum)
Progress Note  Patient Name: Miguel Garcia Date of Encounter: 08/17/2018  Primary Cardiologist: No primary care provider on file.  Subjective   Feeling ok this morning. No chest pain.  He did have a BM last night that had blood associated with it.  Hemoglobin stable today however.  Inpatient Medications    Scheduled Meds: . atorvastatin  80 mg Oral q1800  . bicalutamide  50 mg Oral Daily  . docusate sodium  100 mg Oral BID  . gabapentin  300 mg Oral BID  . insulin aspart  0-5 Units Subcutaneous QHS  . insulin aspart  0-9 Units Subcutaneous TID WC  . isosorbide mononitrate  60 mg Oral Daily  . metoprolol tartrate  50 mg Oral Daily  . sodium chloride flush  3 mL Intravenous Q12H  . tamsulosin  0.4 mg Oral BID  . ticagrelor  90 mg Oral BID   Continuous Infusions: . sodium chloride    . sodium chloride    . sodium chloride 1 mL/kg/hr (08/17/18 0516)   PRN Meds: sodium chloride, acetaminophen, albuterol, ALPRAZolam, hydrALAZINE, hydrocortisone, morphine injection, nitroGLYCERIN, ondansetron (ZOFRAN) IV, sodium chloride flush, zolpidem   Vital Signs    Vitals:   08/17/18 0029 08/17/18 0615 08/17/18 0843 08/17/18 0939  BP: (!) 112/47 (!) 146/65  (!) 143/70  Pulse: 64 65  66  Resp: 16 17    Temp: 98.2 F (36.8 C) 98.3 F (36.8 C)    TempSrc: Oral Oral    SpO2: 97% 100%    Weight:   88.3 kg   Height:        Intake/Output Summary (Last 24 hours) at 08/17/2018 0953 Last data filed at 08/17/2018 5284 Gross per 24 hour  Intake 1330 ml  Output 2000 ml  Net -670 ml   Filed Weights   08/15/18 0545 08/16/18 0500 08/17/18 0843  Weight: 92.8 kg 89.2 kg 88.3 kg    Telemetry    SR - Personally Reviewed  Physical Exam   General: Well developed, well nourished, older W male appearing in no acute distress. Head: Normocephalic, atraumatic.  Neck: Supple without bruits, JVD. Lungs:  Resp regular and unlabored, CTA. Heart: RRR, S1, S2, no murmur; no rub. Abdomen:  Soft, non-tender, non-distended with normoactive bowel sounds. Extremities: No clubbing, cyanosis, edema. Distal pedal pulses are 2+ bilaterally. Neuro: Alert and oriented X 3. Moves all extremities spontaneously. Psych: Normal affect.  Labs    Chemistry Recent Labs  Lab 08/14/18 2318  NA 139  K 4.1  CL 108  CO2 22  GLUCOSE 111*  BUN 27*  CREATININE 1.24  CALCIUM 9.7  GFRNONAA 56*  GFRAA >60  ANIONGAP 9     Hematology Recent Labs  Lab 08/14/18 2318 08/15/18 1203 08/16/18 0611  WBC 4.7 3.6* 3.9*  RBC 3.69* 3.27* 3.55*  HGB 11.7* 10.3* 11.1*  HCT 35.5* 31.0* 33.6*  MCV 96.2 94.8 94.6  MCH 31.7 31.5 31.3  MCHC 33.0 33.2 33.0  RDW 13.9 13.8 13.8  PLT 142* 139* 143*    Cardiac Enzymes Recent Labs  Lab 08/15/18 0632 08/15/18 1203 08/15/18 1802  TROPONINI <0.03 <0.03 <0.03    Recent Labs  Lab 08/14/18 2317 08/15/18 0313  TROPIPOC 0.05 0.06     BNPNo results for input(s): BNP, PROBNP in the last 168 hours.   DDimer No results for input(s): DDIMER in the last 168 hours.    Radiology    No results found.  Cardiac Studies   TTE: 08/15/18: Focal basal  LVH.  EF 60-65%.  No R WMA.  GRII DD.  High filling pressures.  Moderate-severe aortic stenosis (mean gradient 21 mmHg).  Severe LA dilation, moderate RA dilation.   Patient Profile     72 y.o. male with previous CABG. April 08/2018 cath with moderate RCA disease, LIMA patent to LAD and complex stenting of LM and large area of proximal and mid circumflex. Admitted with 2 weeks SSCP consistent with angina Enzymes negative no acute ECG changes.   Assessment & Plan    1. 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  -- Reported exertional chest pain for several days prior to admission. Similar to what he experienced prior to his stent back in 4/19. Troponins negative. EKG non acute. No chest pain this morning. Not on IV heparin given several episodes of blood in BMs this admission. H/H  stable this morning. Planned for cath today.  -- remains on Brilinta. Further recommendations post cath regarding the need for ASA.   Continue Imdur plus beta-blocker, if he does appear to have stable coronary disease, would tend to consider titrating up Imdur versus adding Ranexa.  Provided his coronaries are stable, would simply continue Brilinta without aspirin.  2. HL: continued on statin   3. Hematuria/Melena: Last BM around 6pm last night with blood noted. None this morning. H/H stable this morning. FOBT + this morning.    Signed, Miguel Bellis, NP  08/17/2018, 9:53 AM  Pager # [phone]   I have seen, examined and evaluated the patient this AM along with Miguel Bellis, NP-C.  After reviewing all the available data and chart, we discussed the patients laboratory, study & physical findings as well as symptoms in detail. I agree with her findings, examination as well as impression recommendations as per our discussion.    Attending adjustments noted in italics.  Admitted with symptoms concerning for possible class II-III angina with known coronary disease status post recent complex PCI.  With his concerning possible GI bleed, we are planning to evaluate with cardiac catheterization to determine stent patency.  If no PCI options, would necessarily titrate up medical management.    Miguel Garcia, M.D., M.S. Interventional Cardiologist   Pager # [phone] Phone # [phone] [address] [place], [place] [address]     For questions or updates, please contact [place] Please consult [email or web address] for contact info under Cardiology/STEMI.

## 2018-08-17 NOTE — Progress Notes (Signed)
Pt with bloody, watery BM x1 into toilet this AM. Unable to gather hemoccult. Will continue to monitor.

## 2018-08-17 NOTE — H&P (View-Only) (Signed)
Progress Note  Patient Name: Miguel Garcia Date of Encounter: 08/17/2018  Primary Cardiologist: No primary care provider on file.  Subjective   Feeling ok this morning. No chest pain.  He did have a BM last night that had blood associated with it.  Hemoglobin stable today however.  Inpatient Medications    Scheduled Meds: . atorvastatin  80 mg Oral q1800  . bicalutamide  50 mg Oral Daily  . docusate sodium  100 mg Oral BID  . gabapentin  300 mg Oral BID  . insulin aspart  0-5 Units Subcutaneous QHS  . insulin aspart  0-9 Units Subcutaneous TID WC  . isosorbide mononitrate  60 mg Oral Daily  . metoprolol tartrate  50 mg Oral Daily  . sodium chloride flush  3 mL Intravenous Q12H  . tamsulosin  0.4 mg Oral BID  . ticagrelor  90 mg Oral BID   Continuous Infusions: . sodium chloride    . sodium chloride    . sodium chloride 1 mL/kg/hr (08/17/18 0516)   PRN Meds: sodium chloride, acetaminophen, albuterol, ALPRAZolam, hydrALAZINE, hydrocortisone, morphine injection, nitroGLYCERIN, ondansetron (ZOFRAN) IV, sodium chloride flush, zolpidem   Vital Signs    Vitals:   08/17/18 0029 08/17/18 0615 08/17/18 0843 08/17/18 0939  BP: (!) 112/47 (!) 146/65  (!) 143/70  Pulse: 64 65  66  Resp: 16 17    Temp: 98.2 F (36.8 C) 98.3 F (36.8 C)    TempSrc: Oral Oral    SpO2: 97% 100%    Weight:   88.3 kg   Height:        Intake/Output Summary (Last 24 hours) at 08/17/2018 0953 Last data filed at 08/17/2018 7673 Gross per 24 hour  Intake 1330 ml  Output 2000 ml  Net -670 ml   Filed Weights   08/15/18 0545 08/16/18 0500 08/17/18 0843  Weight: 92.8 kg 89.2 kg 88.3 kg    Telemetry    SR - Personally Reviewed  Physical Exam   General: Well developed, well nourished, older W male appearing in no acute distress. Head: Normocephalic, atraumatic.  Neck: Supple without bruits, JVD. Lungs:  Resp regular and unlabored, CTA. Heart: RRR, S1, S2, no murmur; no rub. Abdomen:  Soft, non-tender, non-distended with normoactive bowel sounds. Extremities: No clubbing, cyanosis, edema. Distal pedal pulses are 2+ bilaterally. Neuro: Alert and oriented X 3. Moves all extremities spontaneously. Psych: Normal affect.  Labs    Chemistry Recent Labs  Lab 08/14/18 2318  NA 139  K 4.1  CL 108  CO2 22  GLUCOSE 111*  BUN 27*  CREATININE 1.24  CALCIUM 9.7  GFRNONAA 56*  GFRAA >60  ANIONGAP 9     Hematology Recent Labs  Lab 08/14/18 2318 08/15/18 1203 08/16/18 0611  WBC 4.7 3.6* 3.9*  RBC 3.69* 3.27* 3.55*  HGB 11.7* 10.3* 11.1*  HCT 35.5* 31.0* 33.6*  MCV 96.2 94.8 94.6  MCH 31.7 31.5 31.3  MCHC 33.0 33.2 33.0  RDW 13.9 13.8 13.8  PLT 142* 139* 143*    Cardiac Enzymes Recent Labs  Lab 08/15/18 0632 08/15/18 1203 08/15/18 1802  TROPONINI <0.03 <0.03 <0.03    Recent Labs  Lab 08/14/18 2317 08/15/18 0313  TROPIPOC 0.05 0.06     BNPNo results for input(s): BNP, PROBNP in the last 168 hours.   DDimer No results for input(s): DDIMER in the last 168 hours.    Radiology    No results found.  Cardiac Studies   TTE: 08/15/18: Focal basal  LVH.  EF 60-65%.  No R WMA.  GRII DD.  High filling pressures.  Moderate-severe aortic stenosis (mean gradient 21 mmHg).  Severe LA dilation, moderate RA dilation.   Patient Profile     72 y.o. male with previous CABG. April 08/2018 cath with moderate RCA disease, LIMA patent to LAD and complex stenting of LM and large area of proximal and mid circumflex. Admitted with 2 weeks SSCP consistent with angina Enzymes negative no acute ECG changes.   Assessment & Plan    1. 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  -- Reported exertional chest pain for several days prior to admission. Similar to what he experienced prior to his stent back in 4/19. Troponins negative. EKG non acute. No chest pain this morning. Not on IV heparin given several episodes of blood in BMs this admission. H/H  stable this morning. Planned for cath today.  -- remains on Brilinta. Further recommendations post cath regarding the need for ASA.   Continue Imdur plus beta-blocker, if he does appear to have stable coronary disease, would tend to consider titrating up Imdur versus adding Ranexa.  Provided his coronaries are stable, would simply continue Brilinta without aspirin.  2. HL: continued on statin   3. Hematuria/Melena: Last BM around 6pm last night with blood noted. None this morning. H/H stable this morning. FOBT + this morning.    Signed, Reino Bellis, NP  08/17/2018, 9:53 AM  Pager # [phone]   I have seen, examined and evaluated the patient this AM along with Reino Bellis, NP-C.  After reviewing all the available data and chart, we discussed the patients laboratory, study & physical findings as well as symptoms in detail. I agree with her findings, examination as well as impression recommendations as per our discussion.    Attending adjustments noted in italics.  Admitted with symptoms concerning for possible class II-III angina with known coronary disease status post recent complex PCI.  With his concerning possible GI bleed, we are planning to evaluate with cardiac catheterization to determine stent patency.  If no PCI options, would necessarily titrate up medical management.    [name], M.D., M.S. Interventional Cardiologist   Pager # [phone] Phone # [phone] [address] [place], [place] [address]     For questions or updates, please contact [place] Please consult [email or web address] for contact info under Cardiology/STEMI.

## 2018-08-17 NOTE — Progress Notes (Addendum)
PROGRESS NOTE  Miguel Garcia HEN:[id number] DOB: 12/23/1946 DOA: 08/14/2018 PCP: Patient, No Pcp Per  HPI/Recap of past 24 hours: HPI: Miguel Garcia is a 72 y.o. male with medical history significant of hypertension, hyperlipidemia, diabetes mellitus, asthma, asbestosis, anxiety, CAD, stent placement 03/2018, CABG 2007, prostate cancer (radiation therapy), who presented with chest pain. He was seen by cardiology and started on aspirin Brilinta and heparin.  Plan was for the patient undergo cardiac catheterization.  In the interim patient developed bright red blood per rectum.  There was some concern about hematuria as well but urine discoloration was thought to be secondary to the Pyridium.  Subjective: Patient states that his bleeding appears to be subsiding.  He noted significantly less amount of blood in his stool last night compared to the previous time.  Denies any dizziness or lightheadedness.  No abdominal pain.  No chest pain or shortness of breath.  Assessment/Plan: Principal Problem:   Chest pain Active Problems:   Hx of CABG   CAD S/P percutaneous coronary angioplasty   Dyslipidemia, goal LDL below 70   Essential hypertension   Diabetes mellitus without complication (HCC)   Prostate cancer (HCC)   AKI (acute kidney injury) ([place])   Angina in the setting of coronary artery disease with history of CABG Cardiology consult ordered.  Apparently he needs cardiac catheterization given complexity of the circumflex intervention 4 months ago.  He was initially placed on heparin but has been discontinued as he has ruled out for acute coronary syndrome.  And also because he developed bright red blood per rectum.  Remains on Brilinta.  Aspirin on hold.  Patient is on beta-blocker and statin.    Hematochezia No obvious abnormalities noted on external rectal examination.  According to patient and nursing staff the bleeding appears to have significantly subsided.  Could have been due to  anticoagulation.  Patient reports colonoscopy about a year and a half ago done by providers from [facility].  Patient is at [employer].  At that time he was found to have 2 polyps which were removed.  There was also some mention of hemorrhoids.  Patient could have had hemorrhoidal bleeding from anticoagulation.  If bleeding subsides completely and if he does not have any significant drop in hemoglobin this can be pursued as an outpatient.  Urine discoloration most likely due to Pyridium.  No blood noted on UA.  History of prostate cancer Stable.  Continue outpatient medications.  Diabetes mellitus without complication Continue SSI coverage.  CBGs.  Normocytic anemia/acute blood loss anemia Monitor hemoglobin closely.  Patient might have had acute blood loss component as well due to rectal bleeding.  Blood work not back from this morning.  DVT ppx:  SCDs Code Status: Full code Family Communication:    Patient's police officer at bed side Disposition Plan:  Anticipate discharge back to [facility] when medically cleared Consults called: Cardiology   Procedures: Transthoracic echocardiogram Study Conclusions  - Left ventricle: The cavity size was normal. There was focal basal   hypertrophy. Systolic function was normal. The estimated ejection   fraction was in the range of 60% to 65%. Wall motion was normal;   there were no regional wall motion abnormalities. Features are   consistent with a pseudonormal left ventricular filling pattern,   with concomitant abnormal relaxation and increased filling   pressure (grade 2 diastolic dysfunction). Doppler parameters are   consistent with high ventricular filling pressure. - Aortic valve: Severely calcified annulus. Trileaflet; severely   thickened  leaflets. 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 There   was mild regurgitation. Mean gradient (S): 21 mm Hg. VTI ratio of   LVOT to aortic valve: 0.28. Valve area (VTI): 0.96 cm^2. Valve   area (Vmax):  0.98 cm^2. Valve area (Vmean): 0.89 cm^2. - Mitral valve: Moderately calcified annulus. Mildly thickened   leaflets . There was mild regurgitation. - Left atrium: The atrium was severely dilated. - Right atrium: The atrium was moderately dilated. - Atrial septum: No defect or patent foramen ovale was identified. - Pulmonary arteries: Systolic pressure was mildly increased. PA   peak pressure: 33 mm Hg (S).  Antimicrobials:  none    Objective: Vitals:   08/17/18 0939 08/17/18 1125  BP: (!) 143/70 109/66  Pulse: 66 (!) 52  Resp:    Temp:  98.2 F (36.8 C)  SpO2:  98%    Intake/Output Summary (Last 24 hours) at 08/17/2018 1137 Last data filed at 08/17/2018 7829 Gross per 24 hour  Intake 1330 ml  Output 1600 ml  Net -270 ml   Filed Weights   08/15/18 0545 08/16/18 0500 08/17/18 0843  Weight: 92.8 kg 89.2 kg 88.3 kg   Body mass index is 29.6 kg/m.  Exam:  General exam: Patient is awake alert.  In no distress Lungs: Clear to auscultation bilaterally.  No wheezing rales or rhonchi.  Normal effort at rest. Cardiovascular: S1-S2 is normal regular.  No S3-S4.  No rubs murmurs or bruit Abdomen: Soft.  Nontender nondistended.  No masses organomegaly Awake and alert.  Oriented x3.  No focal neurological deficits.   Data Reviewed: CBC: Recent Labs  Lab 08/14/18 2318 08/15/18 1203 08/16/18 0611  WBC 4.7 3.6* 3.9*  HGB 11.7* 10.3* 11.1*  HCT 35.5* 31.0* 33.6*  MCV 96.2 94.8 94.6  PLT 142* 139* 562*   Basic Metabolic Panel: Recent Labs  Lab 08/14/18 2318  NA 139  K 4.1  CL 108  CO2 22  GLUCOSE 111*  BUN 27*  CREATININE 1.24  CALCIUM 9.7   GFR: Estimated Creatinine Clearance: 57.3 mL/min (by C-G formula based on SCr of 1.24 mg/dL).  Coagulation Profile: Recent Labs  Lab 08/17/18 0602  INR 1.10   Cardiac Enzymes: Recent Labs  Lab 08/15/18 0632 08/15/18 1203 08/15/18 1802  TROPONINI <0.03 <0.03 <0.03   HbA1C: Recent Labs    08/15/18 0632    HGBA1C 6.1*   CBG: Recent Labs  Lab 08/16/18 1217 08/16/18 1623 08/16/18 2119 08/17/18 0725 08/17/18 1106  GLUCAP 108* 156* 133* 116* 118*   Lipid Profile: Recent Labs    08/15/18 0632  CHOL 119  HDL 34*  LDLCALC 69  TRIG 80  CHOLHDL 3.5   Urine analysis:    Component Value Date/Time   COLORURINE AMBER (A) 08/16/2018 0547   APPEARANCEUR CLEAR 08/16/2018 0547   LABSPEC 1.009 08/16/2018 0547   PHURINE 5.0 08/16/2018 0547   GLUCOSEU NEGATIVE 08/16/2018 0547   HGBUR NEGATIVE 08/16/2018 0547   BILIRUBINUR NEGATIVE 08/16/2018 0547   KETONESUR NEGATIVE 08/16/2018 0547   PROTEINUR NEGATIVE 08/16/2018 0547   NITRITE POSITIVE (A) 08/16/2018 0547   LEUKOCYTESUR NEGATIVE 08/16/2018 0547    Recent Results (from the past 240 hour(s))  MRSA PCR Screening     Status: None   Collection Time: 08/15/18  6:48 AM  Result Value Ref Range Status   MRSA by PCR NEGATIVE NEGATIVE Final    Comment:        The GeneXpert MRSA Assay (FDA approved for NASAL specimens only),  is one component of a comprehensive MRSA colonization surveillance program. It is not intended to diagnose MRSA infection nor to guide or monitor treatment for MRSA infections. Performed at [facility], [place] [address] [place], [place] [address]       Studies: No results found.  Scheduled Meds: . atorvastatin  80 mg Oral q1800  . bicalutamide  50 mg Oral Daily  . docusate sodium  100 mg Oral BID  . gabapentin  300 mg Oral BID  . insulin aspart  0-5 Units Subcutaneous QHS  . insulin aspart  0-9 Units Subcutaneous TID WC  . isosorbide mononitrate  60 mg Oral Daily  . metoprolol tartrate  50 mg Oral Daily  . sodium chloride flush  3 mL Intravenous Q12H  . tamsulosin  0.4 mg Oral BID  . ticagrelor  90 mg Oral BID    Continuous Infusions: . sodium chloride    . sodium chloride    . sodium chloride 1 mL/kg/hr (08/17/18 0516)     LOS: 0 days     Bonnielee Haff, MD [facility]  To  reach me or the doctor on call, go to: [email or web address] Pager #[phone]  08/17/2018, 11:37 AM

## 2018-08-17 NOTE — Interval H&P Note (Signed)
Cath Lab Visit (complete for each Cath Lab visit)  Clinical Evaluation Leading to the Procedure:   ACS: No.  Non-ACS:    Anginal Classification: CCS III  Anti-ischemic medical therapy: Maximal Therapy (2 or more classes of medications)  Non-Invasive Test Results: No non-invasive testing performed  Prior CABG: Previous CABG      History and Physical Interval Note:  08/17/2018 4:31 PM  Miguel Garcia  has presented today for surgery, with the diagnosis of unstable angina  The various methods of treatment have been discussed with the patient and family. After consideration of risks, benefits and other options for treatment, the patient has consented to  Procedure(s): LEFT HEART CATH AND CORS/GRAFTS ANGIOGRAPHY (N/A) as a surgical intervention .  The patient's history has been reviewed, patient examined, no change in status, stable for surgery.  I have reviewed the patient's chart and labs.  Questions were answered to the patient's satisfaction.     Shelva Majestic

## 2018-08-18 ENCOUNTER — Inpatient Hospital Stay (HOSPITAL_COMMUNITY): Payer: Medicare Other

## 2018-08-18 ENCOUNTER — Encounter (HOSPITAL_COMMUNITY): Payer: Self-pay | Admitting: Cardiovascular Disease

## 2018-08-18 DIAGNOSIS — D649 Anemia, unspecified: Secondary | ICD-10-CM

## 2018-08-18 DIAGNOSIS — K921 Melena: Secondary | ICD-10-CM

## 2018-08-18 LAB — CBC
HEMATOCRIT: 33.3 % — AB (ref 39.0–52.0)
HEMATOCRIT: 38.4 % — AB (ref 39.0–52.0)
Hemoglobin: 11.3 g/dL — ABNORMAL LOW (ref 13.0–17.0)
Hemoglobin: 12.9 g/dL — ABNORMAL LOW (ref 13.0–17.0)
MCH: 31.7 pg (ref 26.0–34.0)
MCH: 31.5 pg (ref 26.0–34.0)
MCHC: 33.9 g/dL (ref 30.0–36.0)
MCHC: 33.6 g/dL (ref 30.0–36.0)
MCV: 93.5 fL (ref 78.0–100.0)
MCV: 93.9 fL (ref 78.0–100.0)
PLATELETS: 141 10*3/uL — AB (ref 150–400)
PLATELETS: 179 10*3/uL (ref 150–400)
RBC: 3.56 MIL/uL — ABNORMAL LOW (ref 4.22–5.81)
RBC: 4.09 MIL/uL — ABNORMAL LOW (ref 4.22–5.81)
RDW: 13.4 % (ref 11.5–15.5)
RDW: 13.5 % (ref 11.5–15.5)
WBC: 4.7 10*3/uL (ref 4.0–10.5)
WBC: 5.5 10*3/uL (ref 4.0–10.5)

## 2018-08-18 LAB — BASIC METABOLIC PANEL
ANION GAP: 6 (ref 5–15)
BUN: 17 mg/dL (ref 8–23)
CO2: 25 mmol/L (ref 22–32)
Calcium: 9.5 mg/dL (ref 8.9–10.3)
Chloride: 108 mmol/L (ref 98–111)
Creatinine, Ser: 0.96 mg/dL (ref 0.61–1.24)
Glucose, Bld: 114 mg/dL — ABNORMAL HIGH (ref 70–99)
Potassium: 3.8 mmol/L (ref 3.5–5.1)
Sodium: 139 mmol/L (ref 135–145)

## 2018-08-18 LAB — GLUCOSE, CAPILLARY
GLUCOSE-CAPILLARY: 117 mg/dL — AB (ref 70–99)
GLUCOSE-CAPILLARY: 124 mg/dL — AB (ref 70–99)
Glucose-Capillary: 125 mg/dL — ABNORMAL HIGH (ref 70–99)
Glucose-Capillary: 141 mg/dL — ABNORMAL HIGH (ref 70–99)

## 2018-08-18 MED ORDER — PANTOPRAZOLE SODIUM 40 MG PO TBEC
40.0000 mg | DELAYED_RELEASE_TABLET | Freq: Every day | ORAL | Status: DC
  Administered 2018-08-19: 40 mg via ORAL
  Filled 2018-08-18: qty 1

## 2018-08-18 MED ORDER — TECHNETIUM TC 99M-LABELED RED BLOOD CELLS IV KIT: 25.7000 | PACK | Freq: Once | INTRAVENOUS | Status: DC | PRN

## 2018-08-18 MED FILL — Nitroglycerin IV Soln 100 MCG/ML in D5W: INTRA_ARTERIAL | Qty: 10 | Status: AC

## 2018-08-18 NOTE — Progress Notes (Signed)
PROGRESS NOTE  Miguel Garcia SNK:[id number] DOB: 1946-03-26 DOA: 08/14/2018 PCP: Patient, No Pcp Per  HPI Miguel Garcia is a 72 y.o. male with medical history significant of hypertension, hyperlipidemia, diabetes mellitus, asthma, asbestosis, anxiety, CAD, stent placement 03/2018, CABG 2007, prostate cancer (radiation therapy), who presented with chest pain. He was seen by cardiology and started on aspirin Brilinta and heparin.  Plan was for the patient undergo cardiac catheterization.  In the interim patient developed bright red blood per rectum.  There was some concern about hematuria as well but urine discoloration was thought to be secondary to the Pyridium.  Subjective: Patient was in the bathroom this morning passing bright red blood per rectum.  Complains of some weakness.  Denies any dizziness.  No abdominal pain.  Pain or shortness of breath.    Assessment/Plan:  Principal Problem:   Angina, class III (HCC) Active Problems:   Hx of CABG   CAD S/P percutaneous coronary angioplasty   Dyslipidemia, goal LDL below 70   Essential hypertension   Diabetes mellitus without complication (HCC)   Hematochezia   Angina in the setting of coronary artery disease with history of CABG Patient seen by cardiology.  Patient underwent cardiac catheterization on 8/26 with stent placement in distal left main as well as distal circumflex.  Patient on aspirin and Brilinta.  Patient is on beta-blocker and statin.  Further management per cardiology.    Hematochezia Patient had episode of rectal bleeding while he was on IV heparin.  IV heparin was discontinued.  His bleeding subsided.  Patient underwent cardiac catheterization yesterday and was given more antiplatelet agents.  He is having more rectal bleeding this morning.  Hemoglobin checked at 630 this morning was stable.  We will repeat blood counts this afternoon.  Patient reports colonoscopy about a year and a half which showed 2 polyps which were  removed.  He does not have any local gastroenterologist.  We will consult gastroenterology to assist with management.  Due to stent placement he needs to be on antiplatelet agents.  Discussed with Dr. Ellyn Hack with cardiology who states to continue the same but if something needs to be stopped it has to be just the aspirin and for short duration of time.  Reason for hematochezia not entirely clear.  Urine discoloration most likely due to Pyridium.  No blood noted on UA.  Appears to have resolved since Pyridium was discontinued.  History of prostate cancer Stable.  Continue outpatient medications.  Diabetes mellitus without complication Continue SSI coverage.  CBGs are reasonably well controlled.  Normocytic anemia/acute blood loss anemia Monitor hemoglobin closely.  Patient might have had acute blood loss component as well due to rectal bleeding.  Recheck blood counts later today.  DVT ppx:  SCDs Code Status: Full code Family Communication:    Discussed with patient Disposition Plan:  Anticipate discharge back to [facility] when medically cleared Consults called: Cardiology. [facility] gastroenterology   Procedures: Transthoracic echocardiogram Study Conclusions  - Left ventricle: The cavity size was normal. There was focal basal   hypertrophy. Systolic function was normal. The estimated ejection   fraction was in the range of 60% to 65%. Wall motion was normal;   there were no regional wall motion abnormalities. Features are   consistent with a pseudonormal left ventricular filling pattern,   with concomitant abnormal relaxation and increased filling   pressure (grade 2 diastolic dysfunction). Doppler parameters are   consistent with high ventricular filling pressure. - Aortic valve: Severely calcified annulus.  Trileaflet; severely   thickened leaflets. 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 There   was mild regurgitation. Mean gradient (S): 21 mm Hg. VTI ratio of   LVOT to aortic valve: 0.28.  Valve area (VTI): 0.96 cm^2. Valve   area (Vmax): 0.98 cm^2. Valve area (Vmean): 0.89 cm^2. - Mitral valve: Moderately calcified annulus. Mildly thickened   leaflets . There was mild regurgitation. - Left atrium: The atrium was severely dilated. - Right atrium: The atrium was moderately dilated. - Atrial septum: No defect or patent foramen ovale was identified. - Pulmonary arteries: Systolic pressure was mildly increased. PA   peak pressure: 33 mm Hg (S).   Cardiac catheterization Conclusion     Ost LAD to Prox LAD lesion is 100% stenosed.  1st Diag lesion is 50% stenosed.  Prox LAD to Mid LAD lesion is 10% stenosed.  Ost 3rd Mrg lesion is 40% stenosed.  Previously placed Ost Cx to Prox Cx stent (unknown type) is widely patent.  Previously placed Prox Cx stent (unknown type) is widely patent.  Prox RCA lesion is 35% stenosed.  Mid RCA lesion is 30% stenosed.  Mid LM to Dist LM lesion is 80% stenosed.  Prox Cx to Mid Cx lesion is 15% stenosed.  A stent was successfully placed.  Post intervention, there is a 0% residual stenosis.  Mid Cx to Dist Cx lesion is 90% stenosed.  Post intervention, there is a 0% residual stenosis.  Ost 2nd Mrg lesion is 85% stenosed.  Post intervention, there is a 10% residual stenosis.  Post intervention, there is a 50% residual stenosis.   Significant of coronary obstructive disease with 80% in-stent restenosis in the distal left main stent.  Old total ostial occlusion of the LAD.  Patent proximal left circumflex stent with 15% intimal hyperplasia in the mid circumflex stent followed by eccentric 90% stenosis distal to the mid circumflex stent and 85% ostial stenosis in the OM1 vessel which is jailed by stent overlap of the proximal and mid  previously placed stents.  Dominant RCA with 35% smooth stenosis proximally and 30% stenosis in the mid segment.  Right and left internal mammary artery supplying the mid LAD with 50%  narrowing in the diagonal vessel and patent proximal LAD stent.  Successful multilesion intervention with Cutting Balloon/PTCA of the distal left main stent with the 80% stenosis being reduced to less than 15%; successful stenting of the distal circumflex with insertion of a 2.0 x 12 mm Resolute DES stent postdilated to 2.25 mm with a 90% stenosis being reduced to 0%, and PTCA of the ostium of the OM1 vessel eccentric 85% stenosis reduced  to 50%.  RECOMMENDATION: Medical therapy for concomitant CAD.  High potency statin therapy with target LDL less than 70.  Optimal blood pressure control.  Recommend follow-up 2D echo Doppler study to reassess LV function. Recommend dual antiplatelet therapy with ASA 81 mg and Ticagrelor 90mg  twice daily long-term (beyond 12 months) because of multiple stents and left main involvement in this patient status post prior CABG revascularization.    Antimicrobials:  none    Objective: Vitals:   08/18/18 0924 08/18/18 1000  BP: 121/67 134/65  Pulse:    Resp: 15 17  Temp:    SpO2:      Intake/Output Summary (Last 24 hours) at 08/18/2018 1014 Last data filed at 08/18/2018 0700 Gross per 24 hour  Intake 1387.17 ml  Output 1050 ml  Net 337.17 ml   Filed Weights   08/15/18 0545  08/16/18 0500 08/17/18 0843  Weight: 92.8 kg 89.2 kg 88.3 kg   Body mass index is 29.6 kg/m.  Exam:  General exam: Awake alert.  In no distress. Lungs: Clear to auscultation bilaterally.  No wheezing rales or rhonchi. Cardiovascular: S1-S2 is normal regular.  No S3-S4.  No rubs murmurs or bruit Abdomen: Abdomen remains soft.  Nontender nondistended.  Bowel sounds are present.  No masses organomegaly Alert and oriented x3.  No focal neurological deficits   Data Reviewed: CBC: Recent Labs  Lab 08/14/18 2318 08/15/18 1203 08/16/18 0611 08/17/18 1200 08/18/18 0626  WBC 4.7 3.6* 3.9* 4.3 4.7  HGB 11.7* 10.3* 11.1* 11.4* 11.3*  HCT 35.5* 31.0* 33.6* 34.1* 33.3*  MCV  96.2 94.8 94.6 93.7 93.5  PLT 142* 139* 143* 148* 546*   Basic Metabolic Panel: Recent Labs  Lab 08/14/18 2318 08/17/18 1200 08/18/18 0626  NA 139 139 139  K 4.1 3.8 3.8  CL 108 107 108  CO2 22 23 25   GLUCOSE 111* 121* 114*  BUN 27* 22 17  CREATININE 1.24 1.04 0.96  CALCIUM 9.7 9.5 9.5   GFR: Estimated Creatinine Clearance: 74.1 mL/min (by C-G formula based on SCr of 0.96 mg/dL).  Coagulation Profile: Recent Labs  Lab 08/17/18 0602  INR 1.10   Cardiac Enzymes: Recent Labs  Lab 08/15/18 0632 08/15/18 1203 08/15/18 1802 08/17/18 2156  TROPONINI <0.03 <0.03 <0.03 0.03*   CBG: Recent Labs  Lab 08/17/18 1533 08/17/18 1703 08/17/18 1846 08/17/18 2232 08/18/18 0758  GLUCAP 91 102* 97 156* 125*   Urine analysis:    Component Value Date/Time   COLORURINE AMBER (A) 08/16/2018 0547   APPEARANCEUR CLEAR 08/16/2018 0547   LABSPEC 1.009 08/16/2018 0547   PHURINE 5.0 08/16/2018 0547   GLUCOSEU NEGATIVE 08/16/2018 0547   HGBUR NEGATIVE 08/16/2018 0547   BILIRUBINUR NEGATIVE 08/16/2018 0547   KETONESUR NEGATIVE 08/16/2018 0547   PROTEINUR NEGATIVE 08/16/2018 0547   NITRITE POSITIVE (A) 08/16/2018 0547   LEUKOCYTESUR NEGATIVE 08/16/2018 0547    Recent Results (from the past 240 hour(s))  MRSA PCR Screening     Status: None   Collection Time: 08/15/18  6:48 AM  Result Value Ref Range Status   MRSA by PCR NEGATIVE NEGATIVE Final    Comment:        The GeneXpert MRSA Assay (FDA approved for NASAL specimens only), is one component of a comprehensive MRSA colonization surveillance program. It is not intended to diagnose MRSA infection nor to guide or monitor treatment for MRSA infections. Performed at [facility], [place] [address] [place], [place] [address]       Studies: No results found.  Scheduled Meds: . aspirin  81 mg Oral Daily  . atorvastatin  80 mg Oral q1800  . atropine      . bicalutamide  50 mg Oral Daily  . docusate sodium  100 mg  Oral BID  . fluticasone  2 spray Each Nare Daily  . gabapentin  300 mg Oral BID  . insulin aspart  0-5 Units Subcutaneous QHS  . insulin aspart  0-9 Units Subcutaneous TID WC  . isosorbide mononitrate  60 mg Oral Daily  . metoprolol tartrate  50 mg Oral Daily  . sodium chloride flush  3 mL Intravenous Q12H  . tamsulosin  0.4 mg Oral BID  . ticagrelor  90 mg Oral BID    Continuous Infusions: . sodium chloride    . sodium chloride 125 mL/hr at 08/18/18 0700  .  sodium chloride       LOS: 1 day     Bonnielee Haff, MD [facility]  To reach me or the doctor on call, go to: [email or web address] Pager #[phone]  08/18/2018, 10:14 AM

## 2018-08-18 NOTE — Progress Notes (Signed)
Right femoral arterial sheath removed at 2356  Held x17 min... Soft and no hematoma noted.. Level zero.... Pt tolerated well.   Pressure dressing applied.   Pt education given on monitoring and care... Pt verb understanding.

## 2018-08-18 NOTE — Progress Notes (Signed)
Progress Note  Patient Name: Miguel Garcia Date of Encounter: 08/18/2018  Primary Cardiologist: No primary care provider on file.  Subjective   Feels fine - no CP. Lots of blood in stool this AM.  Inpatient Medications    Scheduled Meds: . aspirin  81 mg Oral Daily  . atorvastatin  80 mg Oral q1800  . atropine      . bicalutamide  50 mg Oral Daily  . docusate sodium  100 mg Oral BID  . fluticasone  2 spray Each Nare Daily  . gabapentin  300 mg Oral BID  . insulin aspart  0-5 Units Subcutaneous QHS  . insulin aspart  0-9 Units Subcutaneous TID WC  . isosorbide mononitrate  60 mg Oral Daily  . metoprolol tartrate  50 mg Oral Daily  . sodium chloride flush  3 mL Intravenous Q12H  . tamsulosin  0.4 mg Oral BID  . ticagrelor  90 mg Oral BID   Continuous Infusions: . sodium chloride    . sodium chloride 125 mL/hr at 08/18/18 0700  . sodium chloride     PRN Meds: sodium chloride, acetaminophen, albuterol, diazepam, hydrocortisone, morphine injection, nitroGLYCERIN, ondansetron (ZOFRAN) IV, sodium chloride flush, zolpidem   Vital Signs    Vitals:   08/18/18 0500 08/18/18 0600 08/18/18 0924 08/18/18 1000  BP: 124/66 (!) 155/69 121/67 134/65  Pulse:      Resp: 11 12 15 17   Temp:      TempSrc:      SpO2:      Weight:      Height:        Intake/Output Summary (Last 24 hours) at 08/18/2018 1013 Last data filed at 08/18/2018 0700 Gross per 24 hour  Intake 1387.17 ml  Output 1050 ml  Net 337.17 ml   Filed Weights   08/15/18 0545 08/16/18 0500 08/17/18 0843  Weight: 92.8 kg 89.2 kg 88.3 kg    Telemetry    SR - Personally Reviewed  EKG - NSR 66. q in III otherwise normal.  Physical Exam   General appearance: alert, cooperative, appears stated age and no distress Neck: no carotid bruit and no JVD Lungs: clear to auscultation bilaterally, normal percussion bilaterally and non-labored, good air movement Heart: regular rate and rhythm, S1, S2 normal, no  murmur, click, rub or gallop and normal apical impulse Abdomen: soft, non-tender; bowel sounds normal; no masses,  no organomegaly Extremities: extremities normal, atraumatic, no cyanosis or edema and Cath site C/D/I Pulses: 2+ and symmetric Skin: Skin color, texture, turgor normal. No rashes or lesions Neurologic: Grossly normal   Labs    Chemistry Recent Labs  Lab 08/14/18 2318 08/17/18 1200 08/18/18 0626  NA 139 139 139  K 4.1 3.8 3.8  CL 108 107 108  CO2 22 23 25   GLUCOSE 111* 121* 114*  BUN 27* 22 17  CREATININE 1.24 1.04 0.96  CALCIUM 9.7 9.5 9.5  GFRNONAA 56* >60 >60  GFRAA >60 >60 >60  ANIONGAP 9 9 6      Hematology Recent Labs  Lab 08/16/18 0611 08/17/18 1200 08/18/18 0626  WBC 3.9* 4.3 4.7  RBC 3.55* 3.64* 3.56*  HGB 11.1* 11.4* 11.3*  HCT 33.6* 34.1* 33.3*  MCV 94.6 93.7 93.5  MCH 31.3 31.3 31.7  MCHC 33.0 33.4 33.9  RDW 13.8 13.7 13.4  PLT 143* 148* 141*    Cardiac Enzymes Recent Labs  Lab 08/15/18 0632 08/15/18 1203 08/15/18 1802 08/17/18 2156  TROPONINI <0.03 <0.03 <0.03 0.03*  Recent Labs  Lab 08/14/18 2317 08/15/18 0313  TROPIPOC 0.05 0.06     BNPNo results for input(s): BNP, PROBNP in the last 168 hours.   DDimer No results for input(s): DDIMER in the last 168 hours.    Radiology    No results found.  Cardiac Studies   TTE: 08/15/18: Focal basal LVH.  EF 60-65%.  No R WMA.  GRII DD.  High filling pressures.  Moderate-severe aortic stenosis (mean gradient 21 mmHg).  Severe LA dilation, moderate RA dilation.  CATH-PCI 08/17/18 Significant coronary obstructive disease with 80% in-stent restenosis in the distal left main stent.  Old total ostial occlusion of the LAD.  Patent proximal left circumflex stent with 15% intimal hyperplasia in the mid circumflex stent followed by eccentric 90% stenosis distal to the mid circumflex stent and 85% ostial stenosis in the OM1 vessel which is jailed by stent overlap of the proximal and  mid  previously placed stents.  Dominant RCA with 35% smooth stenosis proximally and 30% stenosis in the mid segment.  LIMA-mLAD: 50% narrowing in the diagonal vessel and patent proximal LAD stent.  Successful multilesion intervention:  Cutting Balloon/PTCA of the distal LM stent with the 80% stenosis being reduced to less than 15%;   successful stenting of the distal circumflex with insertion of a 2.0 x 12 mm Resolute DES stent postdilated to 2.25 mm with a 90% stenosis being reduced to 0%, and   PTCA of the ostium of the OM1 vessel eccentric 85% stenosis reduced  to 50%.  RECOMMENDATION: Medical therapy for concomitant CAD.  High potency statin therapy with target LDL less than 70.  Optimal blood pressure control.  Recommend follow-up 2D echo Doppler study to reassess LV function. Recommend dual antiplatelet therapy with ASA 81 mg and Ticagrelor 90mg  twice daily long-term (beyond 12 months) because of multiple stents and left main involvement in this patient status post prior CABG revascularization.  Patient Profile     72 y.o. male with previous CABG. April 08/2018 cath with moderate RCA disease, LIMA patent to LAD and complex stenting of LM and large area of proximal and mid circumflex. Admitted with 2 weeks SSCP consistent with angina Enzymes negative no acute ECG changes.   Assessment & Plan  Principal Problem:   Angina, class III (HCC) Active Problems:   Hx of CABG   CAD S/P percutaneous coronary angioplasty   Dyslipidemia, goal LDL below 70   Essential hypertension   Diabetes mellitus without complication ([place])   Hematochezia   1. CLASS III ANGINA / CAD-PCI: History of complex PCI of the LM and LCx back in 4/19.  -- Reported exertional chest pain for several days prior to admission. Similar to what he experienced prior to his stent back in 4/19. Troponins negative. -- Cath yesterday revealed severe LM-Ost Cx DES ISR along with severe stenosis distal to Cx stent & in  jailed OM --> PTCA of ISR, DES of dCX & PTCA of Ost OM.  Now back on DAPT  Continue Imdur plus beta-blocker  Continue statin  2. HL: continued on statin; HTN - on Beta Blocker  -  If BP remains elevated, add ARB (Avapro 75 mg)  3. Hematuria/Melena: MORE blood in his stools overnight.  I suspect some of this may be related to Intracath anticoagulation.  Hopefully as this wears off, the blood will resolve.  Thankfully, hemoglobin is stable. I do agree with GI evaluation.  He has a history of polypectomy, but also has a history  of fissures and possible post prostate cancer related issues in the distal GI tract.  Somewhat difficult to figure out what to do as far as antiplatelet agents since he just was treated with multisite PCI and new DES stent --> would like to do aspirin for at least 1 month and then continue Brilinta going forward.   Is probably okay to transfer to telemetry for now.  Await GI evaluation for further details   Signed, [name], MD  08/18/2018, 10:13 AM  Pager # [phone]    [name], M.D., M.S. Interventional Cardiologist   Pager # [phone] Phone # [phone] [address]. [place], [place] [address]     For questions or updates, please contact [facility] Please consult [email or web address] for contact info under Cardiology/STEMI.

## 2018-08-18 NOTE — Consult Note (Addendum)
[facility] Consult: 11:22 AM 08/18/2018  LOS: 1 day    Referring Provider: Dr Curly Rim  Primary Care Physician:  Patient, No Pcp Per.  Providers at [facility] Primary Gastroenterologist:  unassigned     Reason for Consultation:  GI bleed.     HPI: Miguel Garcia is a 72 y.o. male.  Incarcerated for pst 11 years.  Was at [facility] in [place] last year, now at [place].   PMH HTN.  HLD.  DM 2.  Asthma.  Asbestosis.  Prostate cancer, treated with brachytherapy and XRT for ~ 6 weeks starting Feb 2019, on hormonal Rx.  CAD, stents 2003, CABG 2007, DES stent 03/2018 in [place].  On chronic Brilinta, 81 ASA, since stent.   Colonoscopy with removal of 2 polyps (path unknown) 10/2017 in [facility]  Admitted with CP radiating to area between shoulder blades, SOB with exertion.  Troponins not elevated.  EKG non-ischemic.   Started on IV Heparin.  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  Variant anatomy with replaced left hepatic artery arising directly from the aorta.  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  Diverticulosis in descending and sigmoid colon  8/26 Cardiac cath with angioplasty to LAD restenosis, DES stent to cfx, PTCA to OM1.  Brachytherapy seeds in prostate.    Pt had large volume hematochezia 8/24 after starting IV heparin and receiving 5 ASA in ED.  No n/v and no chronic UGI or LGI sxs.  Some cramping lower abd discomfort relieved after passing blood.  GI bleeding slowed but continued after d/c Heparin.  81 ASA held on 8/25 restarted 8/26, Brilinta continued.   This AM had darker stool in commode and some drips of red blood above water line in commode.  Same lower abd cramps leading up to and subsiding after BM. No  hematuria after 8/24.    Fm Hx of stomach cancer in [place] that led to her death in her 29s.       Past Medical History:  Diagnosis Date  . Anxiety   . Arthritis    "hands, lower back,; L4-5;  neck; C1-2" (03/31/2018)  . Asbestosis ([place])    "of my lungs"  . Asthma   . CAD (coronary artery disease)    CABG 14 days ago  . CAD S/P percutaneous coronary angioplasty 03/31/2018   LM PCI  . Chronic lower back pain   . Heart murmur   . High cholesterol   . Hypertension   . Myocardial infarction [facility]) 2006; 2007; 2012  . Prostate cancer [facility])    "put 3 beamers in my prostate in 11/2017 & radiation completed in 02/2018" (03/31/2018)  . Type II diabetes mellitus ([place])     Past Surgical History:  Procedure Laterality Date  . BACK SURGERY    . CORONARY ANGIOPLASTY WITH STENT PLACEMENT  03/31/2018  . CORONARY ARTERY BYPASS GRAFT  2007  . CORONARY BALLOON ANGIOPLASTY N/A 08/17/2018   Procedure: CORONARY BALLOON ANGIOPLASTY;  Surgeon: Troy Sine,  MD;  Location: [place] CV LAB;  Service: Cardiovascular;  Laterality: N/A;  . CORONARY STENT INTERVENTION N/A 03/31/2018   Procedure: CORONARY STENT INTERVENTION;  Surgeon: Troy Sine, MD;  Location: [place] CV LAB;  Service: Cardiovascular;  Laterality: N/A;  . CORONARY STENT INTERVENTION N/A 08/17/2018   Procedure: CORONARY STENT INTERVENTION;  Surgeon: Troy Sine, MD;  Location: [place] CV LAB;  Service: Cardiovascular;  Laterality: N/A;  . FOREARM FRACTURE SURGERY Right   . FRACTURE SURGERY    . HEMORRHOID BANDING    . LAPAROSCOPIC CHOLECYSTECTOMY    . LEFT HEART CATH AND CORONARY ANGIOGRAPHY N/A 03/31/2018   Procedure: LEFT HEART CATH AND CORONARY ANGIOGRAPHY;  Surgeon: Troy Sine, MD;  Location: [place] CV LAB;  Service: Cardiovascular;  Laterality: N/A;  . LEFT HEART CATH AND CORS/GRAFTS ANGIOGRAPHY N/A 08/17/2018   Procedure: LEFT HEART CATH AND CORS/GRAFTS ANGIOGRAPHY;  Surgeon: Troy Sine, MD;  Location: [place] CV LAB;  Service: Cardiovascular;  Laterality: N/A;  . ORIF SHOULDER FRACTURE Left   . POSTERIOR FUSION LUMBAR SPINE     L4-5  . PROSTATE BIOPSY     "put in 3 [employer] Transponder Implants in 11/2017"    Prior to Admission medications   Medication Sig Start Date End Date Taking? Authorizing Provider  acetaminophen (TYLENOL) 325 MG tablet Take 2 tablets (650 mg total) by mouth every 4 (four) hours as needed for headache or mild pain. Patient taking differently: Take 650 mg by mouth every 4 (four) hours as needed (pain or temp >101).  04/01/18  Yes Kilroy, Luke K, PA-C  albuterol (PROVENTIL HFA;VENTOLIN HFA) 108 (90 Base) MCG/ACT inhaler Inhale 2 puffs into the lungs 4 (four) times daily as needed for wheezing or shortness of breath.   Yes [provider]  aspirin EC 81 MG tablet Take 1 tablet (81 mg total) by mouth daily. 04/13/18  Yes Lendon Colonel, NP  bicalutamide (CASODEX) 50 MG tablet Take 50 mg by mouth daily.   Yes [provider]  chlorpheniramine (CHLOR-TRIMETON) 4 MG tablet Take 4 mg by mouth 4 (four) times daily as needed for allergies.   Yes [provider]  docusate sodium (COLACE) 100 MG capsule Take 100 mg by mouth 2 (two) times daily.   Yes [provider]  doxazosin (CARDURA) 4 MG tablet Take 4 mg by mouth daily.    Yes [provider]  gabapentin (NEURONTIN) 300 MG capsule Take 300 mg by mouth 2 (two) times daily.   Yes [provider]  gemfibrozil (LOPID) 600 MG tablet Take 600 mg by mouth 2 (two) times daily.    Yes [provider]  glipiZIDE (GLUCOTROL XL) 10 MG 24 hr tablet Take 10 mg by mouth daily.    Yes [provider]  isosorbide mononitrate (IMDUR) 60 MG 24 hr tablet Take 1 tablet (60 mg total) by mouth daily. 04/13/18  Yes Lendon Colonel, NP  metoprolol tartrate (LOPRESSOR) 50 MG tablet Take 1 tablet (50 mg total) by mouth daily. 04/13/18  Yes Lendon Colonel, NP  nitroGLYCERIN  (NITROSTAT) 0.4 MG SL tablet Place 1 tablet (0.4 mg total) under the tongue every 5 (five) minutes as needed for chest pain. 04/13/18  Yes Lendon Colonel, NP  pioglitazone (ACTOS) 15 MG tablet Take 15 mg by mouth daily.   Yes [provider]  polyethylene glycol (MIRALAX / GLYCOLAX) packet Take 17 g by mouth daily. Mix 17 grams powder in  4-8 oz of liquid until completely dissolved and drink once daily   Yes [provider]  pramoxine-zinc (TRONOLANE) 1-5 % rectal cream Place 1 application rectally 2 (two) times daily.    Yes [provider]  tamsulosin (FLOMAX) 0.4 MG CAPS capsule Take 0.4 mg by mouth 2 (two) times daily.   Yes [provider]  ticagrelor (BRILINTA) 90 MG TABS tablet Take 1 tablet (90 mg total) by mouth 2 (two) times daily. 04/13/18  Yes Lendon Colonel, NP    Scheduled Meds: . aspirin  81 mg Oral Daily  . atorvastatin  80 mg Oral q1800  . atropine      . bicalutamide  50 mg Oral Daily  . docusate sodium  100 mg Oral BID  . fluticasone  2 spray Each Nare Daily  . gabapentin  300 mg Oral BID  . insulin aspart  0-5 Units Subcutaneous QHS  . insulin aspart  0-9 Units Subcutaneous TID WC  . isosorbide mononitrate  60 mg Oral Daily  . metoprolol tartrate  50 mg Oral Daily  . sodium chloride flush  3 mL Intravenous Q12H  . tamsulosin  0.4 mg Oral BID  . ticagrelor  90 mg Oral BID   Infusions: . sodium chloride    . sodium chloride 125 mL/hr at 08/18/18 0700  . sodium chloride     PRN Meds: sodium chloride, acetaminophen, albuterol, diazepam, hydrocortisone, morphine injection, nitroGLYCERIN, ondansetron (ZOFRAN) IV, sodium chloride flush, zolpidem   Allergies as of 08/14/2018  . (Not on File)    Family History  Problem Relation Age of Onset  . Hypertension Mother     Social History   Socioeconomic History  . Marital status: Divorced    Spouse name: Not on file  . Number of children: Not on file  . Years of  education: Not on file  . Highest education level: Not on file  Occupational History  . Not on file  Social Needs  . Financial resource strain: Not on file  . Food insecurity:    Worry: Not on file    Inability: Not on file  . Transportation needs:    Medical: Not on file    Non-medical: Not on file  Tobacco Use  . Smoking status: Never Smoker  . Smokeless tobacco: Former Systems developer    Types: Chew  Substance and Sexual Activity  . Alcohol use: Not Currently  . Drug use: Not Currently  . Sexual activity: Not Currently  Lifestyle  . Physical activity:    Days per week: Not on file    Minutes per session: Not on file  . Stress: Not on file  Relationships  . Social connections:    Talks on phone: Not on file    Gets together: Not on file    Attends religious service: Not on file    Active member of club or organization: Not on file    Attends meetings of clubs or organizations: Not on file    Relationship status: Not on file  . Intimate partner violence:    Fear of current or ex partner: Not on file    Emotionally abused: Not on file    Physically abused: Not on file    Forced sexual activity: Not on file  Other Topics Concern  . Not on file  Social History Narrative  . Not on file    REVIEW OF SYSTEMS: Constitutional:  No weakness or fatigue ENT:  No nose bleeds Pulm:  No  SOB or cough currently CV:  No palpitations, no LE edema.  GU: No previous hematuria until this admission.  No pain on urination. GI:  Per HPI Heme:  Besides hematuria and GI bleeding, no unusual bleeding.  Bruises easily.  None recently, possible transfusions around time of Transfusions: Thinks he may have had transfusion with red blood in 2007 around the time of his bypass surgery. Neuro:  No headaches, no peripheral tingling or numbness Derm:  No itching, no rash or sores.  Endocrine: Has issues with sweating due to the hormonal therapy with Casodex.  No polyuria or dysuria Immunization: Not  required. Travel:  None beyond local counties in last few months.    PHYSICAL EXAM: Vital signs in last 24 hours: Vitals:   08/18/18 0924 08/18/18 1000  BP: 121/67 134/65  Pulse:    Resp: 15 17  Temp:    SpO2:     Wt Readings from Last 3 Encounters:  08/17/18 88.3 kg  04/13/18 87.5 kg  04/01/18 89 kg    General: Pleasant, cooperative, alert WM appearing his stated age.  He does not look chronically or acutely ill.  Very good historian. Head: No facial asymmetry or swelling.  No signs of head trauma. Eyes: No scleral icterus.  No conjunctival pallor.  EOMI. Ears: Not hard of hearing. Nose: No congestion, no discharge Mouth: Oropharynx moist, clear, tongue midline. Neck: No JVD, no masses, no thyromegaly. Lungs: Clear bilaterally.  No labored breathing or cough. Heart: RRR.  No MRG.  S1, S2 present Abdomen: Active bowel sounds, nontender, nondistended.  No organomegaly, bruits, hernias..   Rectal: Deferred DRE.  Dark blood sitting at the bottom of the commode water.  Drips of red blood seen above the water line.  Overall the appearance looks more like that of older blood. Musc/Skeltl: No joint swelling, redness or gross deformity. Extremities: No CCE. Neurologic: Good historian.  Fluid speech.  No limb weakness, tremors.  No gross deficits. Skin: No obvious rashes, sores, suspicious lesions on the limbs and trunk.   Nodes: No cervical adenopathy. Psych: Calm, cooperative, pleasant.  Intake/Output from previous day: 08/26 0701 - 08/27 0700 In: 1387.2 [I.V.:1387.2] Out: 1050 [Urine:1050] Intake/Output this shift: No intake/output data recorded.  LAB RESULTS: Recent Labs    08/16/18 0611 08/17/18 1200 08/18/18 0626  WBC 3.9* 4.3 4.7  HGB 11.1* 11.4* 11.3*  HCT 33.6* 34.1* 33.3*  PLT 143* 148* 141*   BMET Lab Results  Component Value Date   NA 139 08/18/2018   NA 139 08/17/2018   NA 139 08/14/2018   K 3.8 08/18/2018   K 3.8 08/17/2018   K 4.1 08/14/2018   CL  108 08/18/2018   CL 107 08/17/2018   CL 108 08/14/2018   CO2 25 08/18/2018   CO2 23 08/17/2018   CO2 22 08/14/2018   GLUCOSE 114 (H) 08/18/2018   GLUCOSE 121 (H) 08/17/2018   GLUCOSE 111 (H) 08/14/2018   BUN 17 08/18/2018   BUN 22 08/17/2018   BUN 27 (H) 08/14/2018   CREATININE 0.96 08/18/2018   CREATININE 1.04 08/17/2018   CREATININE 1.24 08/14/2018   CALCIUM 9.5 08/18/2018   CALCIUM 9.5 08/17/2018   CALCIUM 9.7 08/14/2018   LFT No results for input(s): PROT, ALBUMIN, AST, ALT, ALKPHOS, BILITOT, BILIDIR, IBILI in the last 72 hours. PT/INR Lab Results  Component Value Date   INR 1.10 08/17/2018     IMPRESSION:   *  GIB.  Suspect diverticular bleed.  Diverticulosis seen on  CT 8/24. Radiation proctitis is also a possible cause but generally does not cause such large volumes of bleeding. Upper GI source possible, his BUN mildly elevated on 8/23 but quickly normalized since then. Colonoscopy with polypectomy 10/2017.  *   CAD.  Significant history with cardiac stents around 2003, CABG 2007, drug-eluting stent in 03/2018.  Despite therapy with Brilinta and ASA developed in-stent restenosis requiring  angioplasty, DES stenting 8/26.  *   Peripheral vascular disease.  Celiac artery stent, and SMA and IMA per CT NGO on 8/24    PLAN:     *   This discussed with Dr. Bryan Lemma.  Proceed with nuclear med, tagged RBC scan. As a precaution, given that he is going to be on Brilinta/ASA long-term, starting PO Protonix.     Azucena Freed  08/18/2018, 11:22 AM Phone [phone]   Attending physician's note   I have taken an interval history, reviewed the chart and examined the patient. I agree with the Advanced Practitioner's note, impression, and recommendations as outlined.  Agree that self-limiting diverticular bleed is most likely etiology, along with radiation proctitis certainly a possibility, either of which could have been exacerbated by the necessary systemic  anticoagulation on this admission.  Given his BRBPR, stable hemoglobin, essentially normal SFS:[id number] ratio, and lack of UGI symptoms, less suspicious for brisk upper GI bleed. As he is currently hemodynamically stable and stable hemoglobin, we will plan to proceed with tagged RBC scan now to attempt to localize site/region of bleeding (bleed does not seem brisk enough for the higher sensitivity CT angio). If study is negative but continues to have overt GI blood loss or if decrease in hemoglobin or change in hemodynamics, will proceed with endoscopic evaluation with colonoscopy and probable EGD to rule out high risk upper GI lesion given his need for long-term systemic anticoagulation/platelet therapy.  Additional recommendations pending tagged RBC scan.  [address] DO, FACG [phone] office

## 2018-08-18 NOTE — Progress Notes (Signed)
CARDIAC REHAB PHASE I   Came to see pt. Pt out of room for testing. Will return as time allows.  Rufina Falco, RN BSN 08/18/2018 1:42 PM

## 2018-08-18 NOTE — Plan of Care (Signed)
  Problem: Clinical Measurements: Goal: Cardiovascular complication will be avoided Outcome: Progressing   Problem: Nutrition: Goal: Adequate nutrition will be maintained Outcome: Progressing   Problem: Coping: Goal: Level of anxiety will decrease Outcome: Progressing   Problem: Elimination: Goal: Will not experience complications related to bowel motility Outcome: Progressing   Problem: Pain Managment: Goal: General experience of comfort will improve Outcome: Progressing  Pt given 2 mg Morphine IV for sheath removal Problem: Safety: Goal: Ability to remain free from injury will improve Outcome: Progressing

## 2018-08-18 NOTE — Progress Notes (Signed)
Report called to [place]. Patient with no complaints at the current time. Will transfer via [place].

## 2018-08-19 LAB — CBC
HEMATOCRIT: 31.3 % — AB (ref 39.0–52.0)
HEMOGLOBIN: 10.8 g/dL — AB (ref 13.0–17.0)
MCH: 32.1 pg (ref 26.0–34.0)
MCHC: 34.5 g/dL (ref 30.0–36.0)
MCV: 93.2 fL (ref 78.0–100.0)
Platelets: 144 10*3/uL — ABNORMAL LOW (ref 150–400)
RBC: 3.36 MIL/uL — ABNORMAL LOW (ref 4.22–5.81)
RDW: 13.4 % (ref 11.5–15.5)
WBC: 5 10*3/uL (ref 4.0–10.5)

## 2018-08-19 LAB — COMPREHENSIVE METABOLIC PANEL
ALK PHOS: 66 U/L (ref 38–126)
ALT: 15 U/L (ref 0–44)
ANION GAP: 9 (ref 5–15)
AST: 20 U/L (ref 15–41)
Albumin: 3.5 g/dL (ref 3.5–5.0)
BILIRUBIN TOTAL: 0.8 mg/dL (ref 0.3–1.2)
BUN: 23 mg/dL (ref 8–23)
CALCIUM: 9.4 mg/dL (ref 8.9–10.3)
CO2: 23 mmol/L (ref 22–32)
Chloride: 107 mmol/L (ref 98–111)
Creatinine, Ser: 1.14 mg/dL (ref 0.61–1.24)
GFR calc Af Amer: >60 mL/min (ref >60)
Glucose, Bld: 122 mg/dL — ABNORMAL HIGH (ref 70–99)
POTASSIUM: 3.4 mmol/L — AB (ref 3.5–5.1)
Sodium: 139 mmol/L (ref 135–145)
TOTAL PROTEIN: 6.3 g/dL — AB (ref 6.5–8.1)

## 2018-08-19 LAB — GLUCOSE, CAPILLARY
GLUCOSE-CAPILLARY: 123 mg/dL — AB (ref 70–99)
Glucose-Capillary: 140 mg/dL — ABNORMAL HIGH (ref 70–99)
Glucose-Capillary: 135 mg/dL — ABNORMAL HIGH (ref 70–99)

## 2018-08-19 MED ORDER — PANTOPRAZOLE SODIUM 40 MG PO TBEC: 40.0000 mg | DELAYED_RELEASE_TABLET | Freq: Every day | ORAL | 0 refills | Status: DC

## 2018-08-19 MED ORDER — ISOSORBIDE MONONITRATE ER 60 MG PO TB24: 60.0000 mg | ORAL_TABLET | Freq: Every day | ORAL | 0 refills | Status: DC

## 2018-08-19 MED ORDER — TICAGRELOR 90 MG PO TABS: 90.0000 mg | ORAL_TABLET | Freq: Two times a day (BID) | ORAL | 0 refills | Status: DC

## 2018-08-19 MED ORDER — ATORVASTATIN CALCIUM 80 MG PO TABS: 80.0000 mg | ORAL_TABLET | Freq: Every day | ORAL | 0 refills | Status: DC

## 2018-08-19 MED ORDER — MAGNESIUM SULFATE 2 GM/50ML IV SOLN
2.0000 g | Freq: Once | INTRAVENOUS | Status: AC
  Administered 2018-08-19: 2 g via INTRAVENOUS
  Filled 2018-08-19: qty 50

## 2018-08-19 MED ORDER — POTASSIUM CHLORIDE CRYS ER 20 MEQ PO TBCR
40.0000 meq | EXTENDED_RELEASE_TABLET | Freq: Once | ORAL | Status: AC
  Administered 2018-08-19: 40 meq via ORAL
  Filled 2018-08-19: qty 2

## 2018-08-19 MED ORDER — ASPIRIN EC 81 MG PO TBEC: 81.0000 mg | DELAYED_RELEASE_TABLET | Freq: Every day | ORAL | 0 refills | Status: DC

## 2018-08-19 MED ORDER — METOPROLOL TARTRATE 50 MG PO TABS: 50.0000 mg | ORAL_TABLET | Freq: Every day | ORAL | 0 refills | Status: DC

## 2018-08-19 NOTE — Progress Notes (Signed)
Progress Note  Patient Name: Miguel Garcia Date of Encounter: 08/19/2018  Primary Cardiologist: No primary care provider on file.  Subjective   Feels fine - no CP. Minimal blood in stool today - feels better  Inpatient Medications    Scheduled Meds: . aspirin  81 mg Oral Daily  . atorvastatin  80 mg Oral q1800  . bicalutamide  50 mg Oral Daily  . docusate sodium  100 mg Oral BID  . fluticasone  2 spray Each Nare Daily  . gabapentin  300 mg Oral BID  . insulin aspart  0-5 Units Subcutaneous QHS  . insulin aspart  0-9 Units Subcutaneous TID WC  . isosorbide mononitrate  60 mg Oral Daily  . metoprolol tartrate  50 mg Oral Daily  . pantoprazole  40 mg Oral Q0600  . sodium chloride flush  3 mL Intravenous Q12H  . tamsulosin  0.4 mg Oral BID  . ticagrelor  90 mg Oral BID   Continuous Infusions: . sodium chloride    . sodium chloride Stopped (08/18/18 0922)  . sodium chloride      PRN Meds: sodium chloride, acetaminophen, albuterol, diazepam, hydrocortisone, morphine injection, nitroGLYCERIN, ondansetron (ZOFRAN) IV, sodium chloride flush, technetium labeled red blood cells, zolpidem    Vital Signs    Vitals:   08/19/18 0302 08/19/18 0400 08/19/18 0500 08/19/18 0720  BP: 125/68 (!) 112/53 (!) 125/52   Pulse:      Resp: (!) 9 20 11    Temp:    (!) 97.3 F (36.3 C)  TempSrc:    Oral  SpO2:  100%    Weight:      Height:        Intake/Output Summary (Last 24 hours) at 08/19/2018 0908 Last data filed at 08/19/2018 0905 Gross per 24 hour  Intake 566.85 ml  Output 875 ml  Net -308.15 ml   Filed Weights   08/15/18 0545 08/16/18 0500 08/17/18 0843  Weight: 92.8 kg 89.2 kg 88.3 kg    Telemetry    SR - Personally Reviewed  EKG - NSR 66. q in III otherwise normal.  Physical Exam   General appearance: alert, cooperative, appears stated age and no distress Neck: no carotid bruit and no JVD Lungs: clear to auscultation bilaterally, normal percussion  bilaterally and non-labored, good air movement Heart: regular rate and rhythm, S1, S2 normal, no murmur, click, rub or gallop and normal apical impulse Abdomen: soft, non-tender; bowel sounds normal; no masses,  no organomegaly Extremities: extremities normal, atraumatic, no cyanosis or edema and Cath site C/D/I Pulses: 2+ and symmetric Skin: Skin color, texture, turgor normal. No rashes or lesions Neurologic: Grossly normal   Labs    Chemistry Recent Labs  Lab 08/17/18 1200 08/18/18 0626 08/19/18 0215  NA 139 139 139  K 3.8 3.8 3.4*  CL 107 108 107  CO2 23 25 23   GLUCOSE 121* 114* 122*  BUN 22 17 23   CREATININE 1.04 0.96 1.14  CALCIUM 9.5 9.5 9.4  PROT  --   --  6.3*  ALBUMIN  --   --  3.5  AST  --   --  20  ALT  --   --  15  ALKPHOS  --   --  66  BILITOT  --   --  0.8  GFRNONAA >60 >60 >60  GFRAA >60 >60 >60  ANIONGAP 9 6 9      Hematology Recent Labs  Lab 08/18/18 0626 08/18/18 1158 08/19/18 0215  WBC 4.7  5.5 5.0  RBC 3.56* 4.09* 3.36*  HGB 11.3* 12.9* 10.8*  HCT 33.3* 38.4* 31.3*  MCV 93.5 93.9 93.2  MCH 31.7 31.5 32.1  MCHC 33.9 33.6 34.5  RDW 13.4 13.5 13.4  PLT 141* 179 144*    Cardiac Enzymes Recent Labs  Lab 08/15/18 0632 08/15/18 1203 08/15/18 1802 08/17/18 2156  TROPONINI <0.03 <0.03 <0.03 0.03*    Recent Labs  Lab 08/14/18 2317 08/15/18 0313  TROPIPOC 0.05 0.06     BNPNo results for input(s): BNP, PROBNP in the last 168 hours.   DDimer No results for input(s): DDIMER in the last 168 hours.    Radiology    Nm Gi Blood Loss  Result Date: 08/18/2018 CLINICAL DATA:  Rectal bleeding, had blood thinners stopped for a heart catheterization, then developed rectal bleeding after restarting anticoagulation therapy; history coronary artery disease post MI and PTCA, hypertension, prostate cancer, type II diabetes mellitus EXAM: NUCLEAR MEDICINE GASTROINTESTINAL BLEEDING SCAN TECHNIQUE: Sequential abdominal images were obtained following  intravenous administration of Tc-52m labeled red blood cells. RADIOPHARMACEUTICALS:  25.7 mCi Tc-38m pertechnetate in-vitro labeled red cells. COMPARISON:  None FINDINGS: Normal blood pool distribution of tracer. Small amount of de labeled tracer accumulates within urinary bladder over 2 hours of imaging. No abnormal gastrointestinal localization of tracer identified to suggest active GI bleeding. IMPRESSION: Negative GI bleeding exam. Electronically Signed   By: Lavonia Dana M.D.   On: 08/18/2018 16:04    Cardiac Studies   TTE: 08/15/18: Focal basal LVH.  EF 60-65%.  No R WMA.  GRII DD.  High filling pressures.  Moderate-severe aortic stenosis (mean gradient 21 mmHg).  Severe LA dilation, moderate RA dilation.  CATH-PCI 08/17/18 Significant coronary obstructive disease with 80% in-stent restenosis in the distal left main stent.  Old total ostial occlusion of the LAD.  Patent proximal left circumflex stent with 15% intimal hyperplasia in the mid circumflex stent followed by eccentric 90% stenosis distal to the mid circumflex stent and 85% ostial stenosis in the OM1 vessel which is jailed by stent overlap of the proximal and mid  previously placed stents.  Dominant RCA with 35% smooth stenosis proximally and 30% stenosis in the mid segment.  LIMA-mLAD: 50% narrowing in the diagonal vessel and patent proximal LAD stent.  Successful multilesion intervention:  Cutting Balloon/PTCA of the distal LM stent with the 80% stenosis being reduced to less than 15%;   successful stenting of the distal circumflex with insertion of a 2.0 x 12 mm Resolute DES stent postdilated to 2.25 mm with a 90% stenosis being reduced to 0%, and   PTCA of the ostium of the OM1 vessel eccentric 85% stenosis reduced  to 50%.  RECOMMENDATION: Medical therapy for concomitant CAD.  High potency statin therapy with target LDL less than 70.  Optimal blood pressure control.  Recommend follow-up 2D echo Doppler study to  reassess LV function. Recommend dual antiplatelet therapy with ASA 81 mg and Ticagrelor 90mg  twice daily long-term (beyond 12 months) because of multiple stents and left main involvement in this patient status post prior CABG revascularization.  Patient Profile     72 y.o. male with previous CABG. April 08/2018 cath with moderate RCA disease, LIMA patent to LAD and complex stenting of LM and large area of proximal and mid circumflex. Admitted with 2 weeks SSCP consistent with angina Enzymes negative no acute ECG changes.   Assessment & Plan  Principal Problem:   Angina, class III (HCC) Active Problems:   Hx of  CABG   CAD S/P percutaneous coronary angioplasty   Dyslipidemia, goal LDL below 70   Normochromic normocytic anemia   Essential hypertension   Diabetes mellitus without complication (HCC)   Hematochezia   1. CLASS III ANGINA / CAD-PCI: History of complex PCI of the LM and LCx back in 4/19.  -- Reported exertional chest pain for several days prior to admission. Similar to what he experienced prior to his stent back in 4/19. Troponins negative. -- Cath yesterday revealed severe LM-Ost Cx DES ISR along with severe stenosis distal to Cx stent & in jailed OM --> PTCA of ISR, DES of dCX & PTCA of Ost OM.  Back on DAPT post PTCA  Continue Imdur plus beta-blocker & statin  2. HL: continued on statin; HTN - on Beta Blocker  -  If BP remains elevated, add ARB (Avapro 75 mg)  3. Hematuria/Melena: MORE blood in his stools overnight.  I suspect some of this may be related to Intracath anticoagulation.  Hopefully as this wears off, the blood will resolve.  Thankfully, hemoglobin is stable. I do agree with GI evaluation.  He has a history of polypectomy, but also has a history of fissures and possible post prostate cancer related issues in the distal GI tract.  Somewhat difficult to figure out what to do as far as antiplatelet agents since he just was treated with multisite PCI and new DES stent  --> would like to do aspirin for at least 1 month and then continue Brilinta going forward.  Pending tracer scan to look for source of GI Bld  Moved to Tele today - pending GI eval, would be OK for d/c from Cardiology standpoint   [place] will sign off.   Medication Recommendations:  See above Other recommendations (labs, testing, etc):  Would schedule OP CBC check (can send results to Dr. Claiborne Billings @ [place] office. Follow up as an outpatient:  Will arrange & put in d/c info. Signed, [name], MD  08/19/2018, 9:08 AM   Interventional Cardiologist   Pager # [phone] Phone # [phone] [address] [place], [place] [address]   For questions or updates, please contact [place] Please consult [email or web address] for contact info under Cardiology/STEMI.

## 2018-08-19 NOTE — Discharge Summary (Addendum)
Discharge Summary  Miguel Garcia DXI:[id number] DOB: 1946/09/23  PCP: Patient, No Pcp Per  Admit date: 08/14/2018 Discharge date: 08/19/2018  Time spent: 25 minutes  Recommendations for Outpatient Follow-up:  1. Follow-up with your PCP 2. Follow-up with cardiology within a week 3. Take your medications as prescribed  Discharge Diagnoses:  Active Hospital Problems   Diagnosis Date Noted  . Angina, class III ([place]) 08/15/2018  . Hematochezia 08/18/2018  . Essential hypertension 08/15/2018  . Diabetes mellitus without complication ([place]) 76/73/4193  . Hx of CABG 04/01/2018  . CAD S/P percutaneous coronary angioplasty 04/01/2018  . Dyslipidemia, goal LDL below 70 04/01/2018  . Normochromic normocytic anemia 04/01/2018    Resolved Hospital Problems  No resolved problems to display.    Discharge Condition: Stable  Diet recommendation: Heart healthy diet  Vitals:   08/19/18 1127 08/19/18 1629  BP: (!) 144/53 (!) 120/54  Pulse: 67 65  Resp:    Temp: (!) 97.4 F (36.3 C) 98.1 F (36.7 C)  SpO2: 99% 97%    History of present illness:  Miguel Garcia a 72 y.o.malewith medical history significant ofhypertension, hyperlipidemia, diabetes mellitus, asthma, asbestosis, anxiety, CAD, stent placement4/2019,CABG 2007, prostate cancer (radiation therapy), who presented with chest pain. He was seen by cardiology and started on aspirin Brilinta and heparin. Plan was for the patient undergo cardiac catheterization. In the interim patient developed bright red blood per rectum. There was some concern about hematuria as well but urine discoloration was thought to be secondary to the Pyridium.  08/19/2018: Patient seen and examined at his bedside.  He denies bright red blood per rectum or melena overnight or this morning.  Denies abdominal pain or nausea.  RBC scan negative for any acute GI bleeding.  Hemoglobin dropped from 12 k but stable at 10.8.  GI following and signed off.  No  plans for EGD or colonoscopy.   On the day of discharge, the patient was hemodynamically stable.  He denies any recurrence of GI bleed either hematochezia or melena.  He denies dizziness upon standing and walking.  Denies chest pain or dyspnea.  Denies nausea or abdominal pain and tolerating a diet well.  Hospital Course:  Principal Problem:   Angina, class III (HCC) Active Problems:   Hx of CABG   CAD S/P percutaneous coronary angioplasty   Dyslipidemia, goal LDL below 70   Normochromic normocytic anemia   Essential hypertension   Diabetes mellitus without complication (HCC)   Hematochezia  Acute GI bleed suspect diverticular bleed GI consulted and followed.  No plan for procedures. RBC scan unremarkable for any active GI bleed Hemoglobin dropped from 12.6-10.8 this morning No reported recurrent bleeding last night or this morning  Angina in the setting of CAD status post CABG and recent PCI with stent Cardiology followed, Dr Ellyn Hack Last PCI with stent on 08/17/2018 On aspirin and Brilinta Continue beta-blocker, Imdur, and statin Management per cardiology  Coronary artery disease status post CABG and recent PCI with stent Management as stated above  History of prostate cancer Appears stable Continue outpatient medications  Acute blood loss anemia Management as stated above  Type 2 diabetes, controlled non insulin dependent Last A1C 6.1 Stable Continue home meds  Hypokalemia Potassium 3.4 Repleted with p.o. KCl and IV magnesium once  Chronic diastolic CHF Last 2D echo done on 08/15/2018 revealed grade 2 diastolic dysfunction Strict I's and O's and daily weight Continue cardiac medications  Consultants:  GI  Cardiology  Procedures:  Left heart cath on 08/17/2018  Discharge Exam: BP (!) 120/54 (BP Location: Right Arm)   Pulse 65   Temp 98.1 F (36.7 C) (Oral)   Resp 11   Ht 5\' 8"  (1.727 m)   Wt 88.9 kg   SpO2 97%   BMI 29.80 kg/m   . General: 72 y.o. year-old male well developed well nourished in no acute distress.  Alert and oriented x3. . Cardiovascular: Regular rate and rhythm with no rubs or gallops.  No thyromegaly or JVD noted.   Marland Kitchen Respiratory: Clear to auscultation with no wheezes or rales. Good inspiratory effort. . Abdomen: Soft nontender nondistended with normal bowel sounds x4 quadrants. . Musculoskeletal: No lower extremity edema. 2/4 pulses in all 4 extremities. . Skin: No ulcerative lesions noted or rashes, . Psychiatry: Mood is appropriate for condition and setting  Discharge Instructions You were cared for by a hospitalist during your hospital stay. If you have any questions about your discharge medications or the care you received while you were in the hospital after you are discharged, you can call the unit and asked to speak with the hospitalist on call if the hospitalist that took care of you is not available. Once you are discharged, your primary care physician will handle any further medical issues. Please note that NO REFILLS for any discharge medications will be authorized once you are discharged, as it is imperative that you return to your primary care physician (or establish a relationship with a primary care physician if you do not have one) for your aftercare needs so that they can reassess your need for medications and monitor your lab values.  Discharge Instructions    Amb Referral to Cardiac Rehabilitation   Complete by:  As directed    Diagnosis:  Coronary Stents     Allergies as of 08/19/2018   No Known Allergies     Medication List    STOP taking these medications   acetaminophen 325 MG tablet Commonly known as:  TYLENOL   doxazosin 4 MG tablet Commonly known as:  CARDURA   pioglitazone 15 MG tablet Commonly known as:  ACTOS   tamsulosin 0.4 MG Caps capsule Commonly known as:  FLOMAX     TAKE these medications   albuterol 108 (90 Base) MCG/ACT inhaler Commonly known as:   PROVENTIL HFA;VENTOLIN HFA Inhale 2 puffs into the lungs 4 (four) times daily as needed for wheezing or shortness of breath.   aspirin EC 81 MG tablet Take 1 tablet (81 mg total) by mouth daily.   atorvastatin 80 MG tablet Commonly known as:  LIPITOR Take 1 tablet (80 mg total) by mouth daily at 6 PM.   bicalutamide 50 MG tablet Commonly known as:  CASODEX Take 50 mg by mouth daily.   chlorpheniramine 4 MG tablet Commonly known as:  CHLOR-TRIMETON Take 4 mg by mouth 4 (four) times daily as needed for allergies.   docusate sodium 100 MG capsule Commonly known as:  COLACE Take 100 mg by mouth 2 (two) times daily.   gabapentin 300 MG capsule Commonly known as:  NEURONTIN Take 300 mg by mouth 2 (two) times daily.   gemfibrozil 600 MG tablet Commonly known as:  LOPID Take 600 mg by mouth 2 (two) times daily.   glipiZIDE 10 MG 24 hr tablet Commonly known as:  GLUCOTROL XL Take 10 mg by mouth daily.   isosorbide mononitrate 60 MG 24 hr tablet Commonly known as:  IMDUR Take 1 tablet (60 mg total) by mouth daily. Start taking  on:  08/20/2018   metoprolol tartrate 50 MG tablet Commonly known as:  LOPRESSOR Take 1 tablet (50 mg total) by mouth daily. Start taking on:  08/20/2018   nitroGLYCERIN 0.4 MG SL tablet Commonly known as:  NITROSTAT Place 1 tablet (0.4 mg total) under the tongue every 5 (five) minutes as needed for chest pain.   pantoprazole 40 MG tablet Commonly known as:  PROTONIX Take 1 tablet (40 mg total) by mouth daily at 6 (six) AM. Start taking on:  08/20/2018   polyethylene glycol packet Commonly known as:  MIRALAX / GLYCOLAX Take 17 g by mouth daily. Mix 17 grams powder in 4-8 oz of liquid until completely dissolved and drink once daily   pramoxine-zinc 1-5 % rectal cream Commonly known as:  TRONOLANE Place 1 application rectally 2 (two) times daily.   ticagrelor 90 MG Tabs tablet Commonly known as:  BRILINTA Take 1 tablet (90 mg total) by mouth 2  (two) times daily.      No Known Allergies Follow-up Information    Leonie Man, MD. Call in 1 day(s).   Specialty:  Cardiology Why:  Please call for an appointment. Contact information: [address] [place] [address] [phone]            The results of significant diagnostics from this hospitalization (including imaging, microbiology, ancillary and laboratory) are listed below for reference.    Significant Diagnostic Studies: Dg Chest 2 View  Result Date: 08/15/2018 CLINICAL DATA:  Chest pain. EXAM: CHEST - 2 VIEW COMPARISON:  None. FINDINGS: Post median sternotomy and CABG. Mild cardiomegaly. Aortic atherosclerosis. Bilateral calcified pleural plaques. No pulmonary edema. No confluent airspace disease, pleural effusion or pneumothorax. Degenerative change in the spine. IMPRESSION: 1. Cardiomegaly post CABG.  No evidence of CHF. 2. 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 Electronically Signed   By: Jeb Levering M.D.   On: 08/15/2018 00:38   Nm Gi Blood Loss  Result Date: 08/18/2018 CLINICAL DATA:  Rectal bleeding, had blood thinners stopped for a heart catheterization, then developed rectal bleeding after restarting anticoagulation therapy; history coronary artery disease post MI and PTCA, hypertension, prostate cancer, type II diabetes mellitus EXAM: NUCLEAR MEDICINE GASTROINTESTINAL BLEEDING SCAN TECHNIQUE: Sequential abdominal images were obtained following intravenous administration of Tc-30m labeled red blood cells. RADIOPHARMACEUTICALS:  25.7 mCi Tc-2m pertechnetate in-vitro labeled red cells. COMPARISON:  None FINDINGS: Normal blood pool distribution of tracer. Small amount of de labeled tracer accumulates within urinary bladder over 2 hours of imaging. No abnormal gastrointestinal localization of tracer identified to suggest active GI bleeding. IMPRESSION: Negative GI bleeding exam. Electronically Signed   By:  Lavonia Dana M.D.   On: 08/18/2018 16:04   Ct Angio Chest/abd/pel For Dissection W And/or W/wo  Result Date: 08/15/2018 CLINICAL DATA:  Chest pain radiating to the back. EXAM: CT ANGIOGRAPHY CHEST, ABDOMEN AND PELVIS TECHNIQUE: Multidetector CT imaging through the chest, abdomen and pelvis was performed using the standard protocol during bolus administration of intravenous contrast. Multiplanar reconstructed images and MIPs were obtained and reviewed to evaluate the vascular anatomy. CONTRAST:  179mL ISOVUE-370 IOPAMIDOL (ISOVUE-370) INJECTION 76% COMPARISON:  Radiograph earlier this day. FINDINGS: CTA CHEST FINDINGS Cardiovascular: Aortic atherosclerosis without dissection, hematoma, aneurysm, acute aortic syndrome or vasculitis. Heart is normal in size. Post CABG with dense calcification of native coronary arteries. No pericardial effusion. No central pulmonary embolus. Mediastinum/Nodes: No enlarged mediastinal or hilar lymph nodes. Thyroid gland is normal. The esophagus is decompressed. Lungs/Pleura:  Multiple bilateral calcified pleural plaques. No focal consolidation. No pulmonary edema. No pleural fluid. Trachea and mainstem bronchi are patent. Linear atelectasis in the lower lobes, left greater than right. Musculoskeletal: Post median sternotomy. Degenerative change throughout spine. There are no acute or suspicious osseous abnormalities. Review of the MIP images confirms the above findings. CTA ABDOMEN AND PELVIS FINDINGS VASCULAR Aorta: Moderate atherosclerosis without aneurysm, dissection or significant stenosis. No evidence of vasculitis. 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 SMA: Patent without evidence of aneurysm, dissection, vasculitis or significant stenosis. Renals: Plaque at the origin with approximately 50% stenosis on the right. No aneurysm or dissection. IMA: Patent without evidence of  aneurysm, dissection, vasculitis or significant stenosis. Inflow: Patent without evidence of aneurysm, dissection, vasculitis or significant stenosis. Veins: No obvious venous abnormality within the limitations of this arterial phase study. Review of the MIP images confirms the above findings. NON-VASCULAR Hepatobiliary: No focal hepatic lesion on arterial phase imaging. Postcholecystectomy without biliary dilatation. Pancreas: No ductal dilatation or inflammation. Spleen: Normal arterial phase enhancement.  Normal in size. Adrenals/Urinary Tract: Normal adrenal glands. No hydronephrosis. Mild nonspecific perinephric edema. Urinary bladder is distended. Stomach/Bowel: Stomach is nondistended. No evidence of bowel wall thickening, inflammatory change or obstruction. Descending and sigmoid colonic diverticulosis without diverticulitis. Normal appendix. Lymphatic: No enlarged abdominal or pelvic lymph nodes. 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 Other: No free air or ascites. Musculoskeletal: There are no acute or suspicious osseous abnormalities. Review of the MIP images confirms the above findings. IMPRESSION: 1. Diffuse aortic atherosclerosis without dissection or acute aortic abnormality. 2. 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 3. No acute chest finding. Calcified pleural plaques consistent with prior asbestos exposure. 4. No acute abdominopelvic finding. Incidental finding of colonic diverticulosis without diverticulitis. Electronically Signed   By: Jeb Levering M.D.   On: 08/15/2018 06:29    Microbiology: Recent Results (from the past 240 hour(s))  MRSA PCR Screening     Status: None   Collection Time: 08/15/18  6:48 AM  Result Value Ref Range Status   MRSA by PCR NEGATIVE NEGATIVE Final    Comment:        The GeneXpert MRSA Assay (FDA approved for NASAL specimens only), is one component of a comprehensive MRSA colonization surveillance  program. It is not intended to diagnose MRSA infection nor to guide or monitor treatment for MRSA infections. Performed at [facility], [place] [address] [place], [place] [address]      Labs: Basic Metabolic Panel: Recent Labs  Lab 08/14/18 2318 08/17/18 1200 08/18/18 0626 08/19/18 0215  NA 139 139 139 139  K 4.1 3.8 3.8 3.4*  CL 108 107 108 107  CO2 22 23 25 23   GLUCOSE 111* 121* 114* 122*  BUN 27* 22 17 23   CREATININE 1.24 1.04 0.96 1.14  CALCIUM 9.7 9.5 9.5 9.4   Liver Function Tests: Recent Labs  Lab 08/19/18 0215  AST 20  ALT 15  ALKPHOS 66  BILITOT 0.8  PROT 6.3*  ALBUMIN 3.5   No results for input(s): LIPASE, AMYLASE in the last 168 hours. No results for input(s): AMMONIA in the last 168 hours. CBC: Recent Labs  Lab 08/16/18 0611 08/17/18 1200 08/18/18 0626 08/18/18 1158 08/19/18 0215  WBC 3.9* 4.3 4.7 5.5 5.0  HGB 11.1* 11.4* 11.3* 12.9* 10.8*  HCT 33.6* 34.1* 33.3* 38.4* 31.3*  MCV 94.6 93.7  93.5 93.9 93.2  PLT 143* 148* 141* 179 144*   Cardiac Enzymes: Recent Labs  Lab 08/15/18 0632 08/15/18 1203 08/15/18 1802 08/17/18 2156  TROPONINI <0.03 <0.03 <0.03 0.03*   BNP: BNP (last 3 results) No results for input(s): BNP in the last 8760 hours.  ProBNP (last 3 results) No results for input(s): PROBNP in the last 8760 hours.  CBG: Recent Labs  Lab 08/18/18 1628 08/18/18 2148 08/19/18 0723 08/19/18 1125 08/19/18 1628  GLUCAP 117* 124* 140* 123* 135*       Signed:  Kayleen Memos, MD [facility] 08/19/2018, 5:30 PM

## 2018-08-19 NOTE — Progress Notes (Signed)
Report called to Rn [id number]. Patient with no complaints at the current time. Will transfer via [place].

## 2018-08-19 NOTE — Plan of Care (Signed)
  Problem: Clinical Measurements: Goal: Ability to maintain clinical measurements within normal limits will improve Outcome: Completed/Met Goal: Will remain free from infection Outcome: Completed/Met Goal: Diagnostic test results will improve Outcome: Completed/Met Goal: Respiratory complications will improve Outcome: Completed/Met Goal: Cardiovascular complication will be avoided Outcome: Completed/Met   

## 2018-08-19 NOTE — Progress Notes (Addendum)
Daily Rounding Note  08/19/2018, 10:13 AM  LOS: 2 days   SUBJECTIVE:   Chief complaint:     No further bleeding or stools.  The last time he passed any blood or stool was Monday night.  He feels well.  No abdominal pain.  Tolerating carb mod diet.  OBJECTIVE:         Vital signs in last 24 hours:    Temp:  [97.1 F (36.2 C)-98.4 F (36.9 C)] 98 F (36.7 C) (08/28 0900) Pulse Rate:  [68] 68 (08/28 0900) Resp:  [8-20] 11 (08/28 0500) BP: (100-128)/(51-75) 113/75 (08/28 0900) SpO2:  [99 %-100 %] 99 % (08/28 0900) Weight:  [88.9 kg] 88.9 kg (08/28 0900) Last BM Date: 08/18/18 Filed Weights   08/16/18 0500 08/17/18 0843 08/19/18 0900  Weight: 89.2 kg 88.3 kg 88.9 kg   General: Looks well no distress Heart: RRR. Chest: Clear bilaterally.  No labored breathing. Abdomen: Soft.  Not tender or distended.  Active bowel sounds. Extremities: No CCE. Neuro/Psych: Alert and oriented x3.  No tremors, no limb weakness, no gross deficits.  Intake/Output from previous day: 08/27 0701 - 08/28 0700 In: 1039.9 [P.O.:720; I.V.:279.1; IV Piggyback:40.8] Out: 1000 [Urine:1000]  Intake/Output this shift: Total I/O In: -  Out: 375 [Urine:375]  Lab Results: Recent Labs    08/18/18 0626 08/18/18 1158 08/19/18 0215  WBC 4.7 5.5 5.0  HGB 11.3* 12.9* 10.8*  HCT 33.3* 38.4* 31.3*  PLT 141* 179 144*   BMET Recent Labs    08/17/18 1200 08/18/18 0626 08/19/18 0215  NA 139 139 139  K 3.8 3.8 3.4*  CL 107 108 107  CO2 23 25 23   GLUCOSE 121* 114* 122*  BUN 22 17 23   CREATININE 1.04 0.96 1.14  CALCIUM 9.5 9.5 9.4   LFT Recent Labs    08/19/18 0215  PROT 6.3*  ALBUMIN 3.5  AST 20  ALT 15  ALKPHOS 66  BILITOT 0.8   PT/INR Recent Labs    08/17/18 0602  LABPROT 14.1  INR 1.10   Hepatitis Panel No results for input(s): HEPBSAG, HCVAB, HEPAIGM, HEPBIGM in the last 72 hours.  Studies/Results: Nm Gi Blood  Loss  Result Date: 08/18/2018 CLINICAL DATA:  Rectal bleeding, had blood thinners stopped for a heart catheterization, then developed rectal bleeding after restarting anticoagulation therapy; history coronary artery disease post MI and PTCA, hypertension, prostate cancer, type II diabetes mellitus EXAM: NUCLEAR MEDICINE GASTROINTESTINAL BLEEDING SCAN TECHNIQUE: Sequential abdominal images were obtained following intravenous administration of Tc-20m labeled red blood cells. RADIOPHARMACEUTICALS:  25.7 mCi Tc-29m pertechnetate in-vitro labeled red cells. COMPARISON:  None FINDINGS: Normal blood pool distribution of tracer. Small amount of de labeled tracer accumulates within urinary bladder over 2 hours of imaging. No abnormal gastrointestinal localization of tracer identified to suggest active GI bleeding. IMPRESSION: Negative GI bleeding exam. Electronically Signed   By: Lavonia Dana M.D.   On: 08/18/2018 16:04   Scheduled Meds: . aspirin  81 mg Oral Daily  . atorvastatin  80 mg Oral q1800  . bicalutamide  50 mg Oral Daily  . docusate sodium  100 mg Oral BID  . fluticasone  2 spray Each Nare Daily  . gabapentin  300 mg Oral BID  . insulin aspart  0-5 Units Subcutaneous QHS  . insulin aspart  0-9 Units Subcutaneous TID WC  . isosorbide mononitrate  60 mg Oral Daily  . metoprolol tartrate  50 mg Oral Daily  .  pantoprazole  40 mg Oral Q0600  . sodium chloride flush  3 mL Intravenous Q12H  . tamsulosin  0.4 mg Oral BID  . ticagrelor  90 mg Oral BID   Continuous Infusions: . sodium chloride    . sodium chloride Stopped (08/18/18 0922)  . sodium chloride     PRN Meds:.sodium chloride, acetaminophen, albuterol, diazepam, hydrocortisone, morphine injection, nitroGLYCERIN, ondansetron (ZOFRAN) IV, sodium chloride flush, technetium labeled red blood cells, zolpidem   ASSESMENT:   *  GIB.  Suspect diverticular bleed.  Diverticulosis seen on CT 8/24. Radiation proctitis is also a possible cause but  generally does not cause such large volumes of bleeding. Upper GI source possible, his BUN mildly elevated on 8/23 but quickly normalized since then. Colonoscopy with polypectomy 10/2017. Tagged RBC scan 8/27: negative active bleeding.    *   Blood loss anemia.  Hgbb 11.3 >> 12.9 >> 10.8 in last 22 hours.    *   Thrombocytopenia, non-critical.    *   CAD.  Significant history with cardiac stents around 2003, CABG 2007, drug-eluting stent in 03/2018.  Despite therapy with Brilinta and ASA developed in-stent restenosis requiring  angioplasty, DES stenting 8/26.   *   Peripheral vascular disease.  Celiac artery stent, and SMA and IMA per CT angio on 8/24    PLAN   *  Per Dr Bryan Lemma who will round later today.  No plans for colonoscopy. Continue Brilinta and low dose ASA.        Azucena Freed  08/19/2018, 10:13 AM Phone [phone]   Attending physician's note   I have taken an interval history, reviewed the chart and examined the patient. I agree with the Advanced Practitioner's note, impression, and recommendations as outlined. No further overt GI blood loss and Hgb stable overnight and HD stable. Tagged RBC scan negative. No clinical manifestations c/w UGI etiology for bleed. No plan for endoscopic evaluation at this time. Will sign off but please do not hesitate to contact with additional questions or concerns.    [address] DO, FACG [phone] office

## 2018-08-19 NOTE — Progress Notes (Signed)
PROGRESS NOTE  Miguel Garcia NLZ:[id number] DOB: 06-Feb-1946 DOA: 08/14/2018 PCP: Patient, No Pcp Per  HPI/Recap of past 24 hours: Miguel Garcia a 72 y.o.malewith medical history significant ofhypertension, hyperlipidemia, diabetes mellitus, asthma, asbestosis, anxiety, CAD, stent placement4/2019,CABG 2007, prostate cancer (radiation therapy), who presented with chest pain. He was seen by cardiology and started on aspirin Brilinta and heparin.  Plan was for the patient undergo cardiac catheterization.  In the interim patient developed bright red blood per rectum.  There was some concern about hematuria as well but urine discoloration was thought to be secondary to the Pyridium.  08/19/2018: Patient seen and examined at his bedside.  He denies bright red blood per rectum or melena overnight or this morning.  Denies abdominal pain or nausea.  RBC scan negative for any acute GI bleeding.  Hemoglobin dropped from 12 k but stable at 10.8.  GI following.  Assessment/Plan: Principal Problem:   Angina, class III (HCC) Active Problems:   Hx of CABG   CAD S/P percutaneous coronary angioplasty   Dyslipidemia, goal LDL below 70   Normochromic normocytic anemia   Essential hypertension   Diabetes mellitus without complication (HCC)   Hematochezia   Acute GI bleed suspect diverticular bleed GI consulted and following RBC scan unremarkable for any active GI bleed Hemoglobin dropped from 12.6-10.8 this morning No reported recurrent bleeding last night or this morning Repeat CBC in the morning  Angina in the setting of CAD status post CABG and recent PCI with stent Cardiology following Last PCI with stent on 08/17/2018 On aspirin and Brilinta Continue beta-blocker and statin Management per cardiology  Coronary artery disease status post CABG and recent PCI with stent Management as stated above  History of prostate cancer Appears stable Continue outpatient medications  Acute blood  loss anemia Management as stated above  Type 2 diabetes Stable Continue insulin sliding scale  Hypokalemia Potassium 3.4 Repleted with p.o. KCl and IV magnesium once  Chronic diastolic CHF Last 2D echo done on 08/15/2018 revealed grade 2 diastolic dysfunction Strict I's and O's and daily weight Continue cardiac medications   Code Status: Full code  Family Communication: None at bedside  Disposition Plan: Discharge possibly tomorrow 08/20/2018 or when GI signs of   Consultants:  GI  Cardiology  Procedures:  Left heart cath on 08/17/2018  Antimicrobials:  None  DVT prophylaxis: SCDs   Objective: Vitals:   08/19/18 0400 08/19/18 0500 08/19/18 0720 08/19/18 0900  BP: (!) 112/53 (!) 125/52  113/75  Pulse:    68  Resp: 20 11    Temp:   (!) 97.3 F (36.3 C) 98 F (36.7 C)  TempSrc:   Oral Oral  SpO2: 100%   99%  Weight:    88.9 kg  Height:    5\' 8"  (1.727 m)    Intake/Output Summary (Last 24 hours) at 08/19/2018 1101 Last data filed at 08/19/2018 0905 Gross per 24 hour  Intake 566.85 ml  Output 875 ml  Net -308.15 ml   Filed Weights   08/16/18 0500 08/17/18 0843 08/19/18 0900  Weight: 89.2 kg 88.3 kg 88.9 kg    Exam:  . General: 72 y.o. year-old male well developed well nourished in no acute distress.  Alert and oriented x3. . Cardiovascular: Regular rate and rhythm with no rubs or gallops.  No thyromegaly or JVD noted.   Marland Kitchen Respiratory: Clear to auscultation with no wheezes or rales. Good inspiratory effort. . Abdomen: Soft nontender nondistended with normal bowel sounds x4  quadrants. . Musculoskeletal: No lower extremity edema. 2/4 pulses in all 4 extremities. . Skin: No ulcerative lesions noted or rashes, . Psychiatry: Mood is appropriate for condition and setting   Data Reviewed: CBC: Recent Labs  Lab 08/16/18 0611 08/17/18 1200 08/18/18 0626 08/18/18 1158 08/19/18 0215  WBC 3.9* 4.3 4.7 5.5 5.0  HGB 11.1* 11.4* 11.3* 12.9* 10.8*  HCT  33.6* 34.1* 33.3* 38.4* 31.3*  MCV 94.6 93.7 93.5 93.9 93.2  PLT 143* 148* 141* 179 024*   Basic Metabolic Panel: Recent Labs  Lab 08/14/18 2318 08/17/18 1200 08/18/18 0626 08/19/18 0215  NA 139 139 139 139  K 4.1 3.8 3.8 3.4*  CL 108 107 108 107  CO2 22 23 25 23   GLUCOSE 111* 121* 114* 122*  BUN 27* 22 17 23   CREATININE 1.24 1.04 0.96 1.14  CALCIUM 9.7 9.5 9.5 9.4   GFR: Estimated Creatinine Clearance: 62.5 mL/min (by C-G formula based on SCr of 1.14 mg/dL). Liver Function Tests: Recent Labs  Lab 08/19/18 0215  AST 20  ALT 15  ALKPHOS 66  BILITOT 0.8  PROT 6.3*  ALBUMIN 3.5   No results for input(s): LIPASE, AMYLASE in the last 168 hours. No results for input(s): AMMONIA in the last 168 hours. Coagulation Profile: Recent Labs  Lab 08/17/18 0602  INR 1.10   Cardiac Enzymes: Recent Labs  Lab 08/15/18 0632 08/15/18 1203 08/15/18 1802 08/17/18 2156  TROPONINI <0.03 <0.03 <0.03 0.03*   BNP (last 3 results) No results for input(s): PROBNP in the last 8760 hours. HbA1C: No results for input(s): HGBA1C in the last 72 hours. CBG: Recent Labs  Lab 08/18/18 0758 08/18/18 1218 08/18/18 1628 08/18/18 2148 08/19/18 0723  GLUCAP 125* 141* 117* 124* 140*   Lipid Profile: No results for input(s): CHOL, HDL, LDLCALC, TRIG, CHOLHDL, LDLDIRECT in the last 72 hours. Thyroid Function Tests: No results for input(s): TSH, T4TOTAL, FREET4, T3FREE, THYROIDAB in the last 72 hours. Anemia Panel: No results for input(s): VITAMINB12, FOLATE, FERRITIN, TIBC, IRON, RETICCTPCT in the last 72 hours. Urine analysis:    Component Value Date/Time   COLORURINE AMBER (A) 08/16/2018 0547   APPEARANCEUR CLEAR 08/16/2018 0547   LABSPEC 1.009 08/16/2018 0547   PHURINE 5.0 08/16/2018 0547   GLUCOSEU NEGATIVE 08/16/2018 0547   HGBUR NEGATIVE 08/16/2018 0547   BILIRUBINUR NEGATIVE 08/16/2018 0547   KETONESUR NEGATIVE 08/16/2018 0547   PROTEINUR NEGATIVE 08/16/2018 0547   NITRITE  POSITIVE (A) 08/16/2018 0547   LEUKOCYTESUR NEGATIVE 08/16/2018 0547   Sepsis Labs: @LABRCNTIP (procalcitonin:4,lacticidven:4)  ) Recent Results (from the past 240 hour(s))  MRSA PCR Screening     Status: None   Collection Time: 08/15/18  6:48 AM  Result Value Ref Range Status   MRSA by PCR NEGATIVE NEGATIVE Final    Comment:        The GeneXpert MRSA Assay (FDA approved for NASAL specimens only), is one component of a comprehensive MRSA colonization surveillance program. It is not intended to diagnose MRSA infection nor to guide or monitor treatment for MRSA infections. Performed at [facility], [place] [address], [place], [place] [address]       Studies: Nm Gi Blood Loss  Result Date: 08/18/2018 CLINICAL DATA:  Rectal bleeding, had blood thinners stopped for a heart catheterization, then developed rectal bleeding after restarting anticoagulation therapy; history coronary artery disease post MI and PTCA, hypertension, prostate cancer, type II diabetes mellitus EXAM: NUCLEAR MEDICINE GASTROINTESTINAL BLEEDING SCAN TECHNIQUE: Sequential abdominal images were obtained following  intravenous administration of Tc-39m labeled red blood cells. RADIOPHARMACEUTICALS:  25.7 mCi Tc-18m pertechnetate in-vitro labeled red cells. COMPARISON:  None FINDINGS: Normal blood pool distribution of tracer. Small amount of de labeled tracer accumulates within urinary bladder over 2 hours of imaging. No abnormal gastrointestinal localization of tracer identified to suggest active GI bleeding. IMPRESSION: Negative GI bleeding exam. Electronically Signed   By: Lavonia Dana M.D.   On: 08/18/2018 16:04    Scheduled Meds: . aspirin  81 mg Oral Daily  . atorvastatin  80 mg Oral q1800  . bicalutamide  50 mg Oral Daily  . docusate sodium  100 mg Oral BID  . fluticasone  2 spray Each Nare Daily  . gabapentin  300 mg Oral BID  . insulin aspart  0-5 Units Subcutaneous QHS  . insulin aspart  0-9 Units  Subcutaneous TID WC  . isosorbide mononitrate  60 mg Oral Daily  . metoprolol tartrate  50 mg Oral Daily  . pantoprazole  40 mg Oral Q0600  . sodium chloride flush  3 mL Intravenous Q12H  . tamsulosin  0.4 mg Oral BID  . ticagrelor  90 mg Oral BID    Continuous Infusions: . sodium chloride    . sodium chloride Stopped (08/18/18 0922)  . sodium chloride       LOS: 2 days     Kayleen Memos, MD [facility] Pager [phone]  If 7PM-7AM, please contact night-coverage [email or web address] Password [email or web address] 08/19/2018, 11:01 AM

## 2018-08-19 NOTE — Progress Notes (Signed)
CARDIAC REHAB PHASE I   PRE:  Rate/Rhythm: 40 SR  BP:  Sitting: 136/69      SaO2: 97 RA  MODE:  Ambulation: 470 ft   POST:  Rate/Rhythm: 83 SR  BP:  Sitting: 144/75    SaO2: 96 RA   Pt ambulated 421ft in hallway independently with slow steady gait. Pt educated on importance of ASA, Brilinta, statin, and NTG. Pt given stent card and heart healthy diet. Reviewed restrictions and exercise guidelines. Will refer to CRP II [place], aware that pt will not be able to attend.  [id number] Rufina Falco, RN BSN 08/19/2018 1:53 PM

## 2018-08-19 NOTE — Care Management Note (Signed)
Case Management Note  Patient Details  Name: Miguel Garcia MRN: [id number] Date of Birth: Aug 28, 1946  Subjective/Objective:  72 y.o.malepresented from [facility] with medical history significant ofhypertension, hyperlipidemia, diabetes mellitus, asthma, asbestosis, anxiety, CAD, stent placement4/2019,CABG 2007, prostate cancer (radiation therapy), who presented with chest pain. Patient s/p cath on 08/17/18 with PCI.               Action/Plan: Request received from [facility] for [place] requesting an update on patient's status and progress notes. Progress notes faxed via Epic. CM contacted Rollene Fare and provided an update on patient's status and informed of bed transfer to 6E30 with 6E RNCM contact info provided.  Expected Discharge Date:                  Expected Discharge Plan:  [facility]  In-House Referral:  NA  Discharge planning Services  CM Consult  Post Acute Care Choice:  NA Choice offered to:  NA  DME Arranged:  N/A DME Agency:  NA  HH Arranged:  NA HH Agency:  NA  Status of Service:  In process, will continue to follow  If discussed at Long Length of Stay Meetings, dates discussed:    Additional Comments:  Midge Minium RN, BSN, NCM-BC, ACM-RN [phone] 08/19/2018, 10:28 AM

## 2018-08-19 NOTE — Progress Notes (Signed)
Discharge instructions & education given to pt. All questions were answered. Pt's prescriptions were given to the guard. All of the pt's belongings were given & taken to the pt. Hoover Brunette, RN

## 2018-08-19 NOTE — Discharge Instructions (Signed)
Angina Pectoris Angina pectoris is a very bad feeling in the chest, neck, or arm. Your doctor may call it angina. There are four types of angina. Angina is caused by a lack of blood in the middle and thickest layer of the heart wall (myocardium). Angina may feel like a crushing or squeezing pain in the chest. It may feel like tightness or heavy pressure in the chest. Some people say it feels like gas, heartburn, or indigestion. Some people have symptoms other than pain. These include:  Shortness of breath.  Cold sweats.  Feeling sick to your stomach (nausea).  Feeling light-headed.  Many women have chest discomfort and some of the other symptoms. However, women often have different symptoms, such as:  Feeling tired (fatigue).  Feeling nervous for no reason.  Feeling weak for no reason.  Dizziness or fainting.  Women may have angina without any symptoms. Follow these instructions at home:  Take medicines only as told by your doctor.  Take care of other health issues as told by your doctor. These include: ? High blood pressure (hypertension). ? Diabetes.  Follow a heart-healthy diet. Your doctor can help you to choose healthy food options and make changes.  Talk to your doctor to learn more about healthy cooking methods and use them. These include: ? Roasting. ? Grilling. ? Broiling. ? Baking. ? Poaching. ? Steaming. ? Stir-frying.  Follow an exercise program approved by your doctor.  Keep a healthy weight. Lose weight as told by your doctor.  Rest when you are tired.  Learn to manage stress.  Do not use any tobacco, such as cigarettes, chewing tobacco, or electronic cigarettes. If you need help quitting, ask your doctor.  If you drink alcohol, and your doctor says it is okay, limit yourself to no more than 1 drink per day. One drink equals 12 ounces of beer, 5 ounces of wine, or 1 ounces of hard liquor.  Stop illegal drug use.  Keep all follow-up visits as told  by your doctor. This is important. Do not take these medicines unless your doctor says that you can:  Nonsteroidal anti-inflammatory drugs (NSAIDs). These include: ? Ibuprofen. ? Naproxen. ? Celecoxib.  Vitamin supplements that have vitamin A, vitamin E, or both.  Hormone therapy that contains estrogen with or without progestin.  Get help right away if:  You have pain in your chest, neck, arm, jaw, stomach, or back that: ? Lasts more than a few minutes. ? Comes back. ? Does not get better after you take medicine under your tongue (sublingual nitroglycerin).  You have any of these symptoms for no reason: ? Gas, heartburn, or indigestion. ? Sweating a lot. ? Shortness of breath or trouble breathing. ? Feeling sick to your stomach or throwing up. ? Feeling more tired than usual. ? Feeling nervous or worrying more than usual. ? Feeling weak. ? Diarrhea.  You are suddenly dizzy or light-headed.  You faint or pass out. These symptoms may be an emergency. Do not wait to see if the symptoms will go away. Get medical help right away. Call your local emergency services (911 in the [place] Do not drive yourself to the hospital. This information is not intended to replace advice given to you by your health care provider. Make sure you discuss any questions you have with your health care provider. Document Released: 05/27/2008 Document Revised: 05/16/2016 Document Reviewed: 04/12/2014 Elsevier Interactive Patient Education  2017 [employer]   Preventing Heart Failure Heart failure is a condition  in which the heart has trouble pumping blood. This may mean that the heart cannot pump enough blood out to the body, or that the heart does not fill up with enough blood. Either of those problems can lead to symptoms such as fatigue, trouble breathing, and swelling throughout the body. This is a common medical condition that affects not only the heart, but the entire body. Making certain  nutrition and lifestyle changes can help you prevent heart failure and avoid serious health problems. What nutrition changes can be made?  If you are overweight or obese, reduce how many calories you eat each day so that you lose weight. Work with your health care provider or a diet and nutrition specialist (dietitian) to determine how many calories you need each day.  Eat foods that are low in salt (sodium). Avoid adding extra salt to foods.  Eat a well-balanced diet that includes a lot of: ? Fresh fruits and vegetables. ? Whole grains. ? Lean meats. ? Beans. ? Fat-free or low-fat dairy products.  Avoid foods that contain a lot of: ? Trans fats. ? Saturated fats. ? Sugar. ? Cholesterol. What lifestyle changes can be made?  Do not use any products that contain nicotine or tobacco, such as cigarettes and e-cigarettes. If you need help quitting or reducing how much you smoke, ask your health care provider.  Stop using alcohol, or limit alcohol intake to no more than 1 drink a day for nonpregnant women and 2 drinks a day for men. One drink equals 12 oz of beer, 5 oz of wine, or 1 oz of hard liquor.  Exercise for at least 150 minutes each week, or as much as told by your health care provider. ? Do moderate-intensity exercise, such as brisk walking, bicycling, or water aerobics. ? Ask your health care provider which activities are safe for you.  See a health care provider regularly for screening and wellness checks. Know your heart health indicators, such as: ? Blood pressure. ? Cholesterol levels. ? Blood sugar (glucose) levels. ? Weight and BMI.  If you have diabetes, manage your condition and follow your treatment plan as instructed.  Try to get 7-9 hours of sleep each night. To help with sleep: ? Keep your bedroom cool and dark. ? Do not eat a heavy meal during the hour before you go to bed. ? Do not drink alcohol or caffeinated drinks before bed. ? Avoid screen time before  bedtime. This means avoiding television, computers, tablets, and cell phones.  Find ways to relax and manage stress. These may include: ? Breathing exercises. ? Meditation. ? Yoga. ? Listening to music. Why are these changes important?  A well-balanced diet with the appropriate amount of calories can keep your body weight at a healthy level, which reduces strain on your heart.  A low-sodium diet can help keep your blood pressure in a normal range and keep your blood vessels working properly.  Quitting smoking and limiting alcohol intake can reduce harmful effects that these substances have on your heart and blood vessels.  Regular exercise can keep your heart strong so it can pump blood normally.  Managing diabetes helps your blood circulate and can help you maintain a healthy weight.  Managing stress helps to reduce the risk of high blood pressure and heart problems. What can happen if changes are not made? Heart failure can cause very serious problems that may get worse over time, such as:  Extreme fatigue during normal physical activities.  Shortness  of breath or trouble breathing.  Swelling in your abdomen, legs, ankles, feet, or neck.  Fluid buildup throughout the body.  Weight gain.  Cough.  Frequent urination.  What can I do to lower my risk? You may be able to lower your risk of heart failure by:  Losing weight or keeping your weight under control.  Working with your health care provider to manage your: ? Cholesterol. ? Blood pressure. ? Diabetes, if this applies.  Eating a healthy diet.  Exercising regularly.  Avoiding unhealthy habits, such as smoking, drinking, or using drugs.  Getting plenty of sleep.  Managing your stress.  How is this treated? Heart failure cannot be cured except by heart transplant, but treatment can help to improve your quality of life. Treatment may include:  Medicines to help: ? Lower blood pressure. ? Remove excess  sodium from your body. ? Relax blood vessels. ? Improve heart function. ? Control other symptoms of heart failure.  Surgery to open blocked coronary arteries or repair damaged heart valves.  Implantation of a biventricular pacemaker to improve heart muscle function (cardiac resynchronization therapy). This device paces both the right ventricle and left ventricle.  Implantation of a device to treat serious abnormal heart rhythms (implantable cardioverter defibrillator, ICD).  Implantation of a mechanical heart pump to improve the pumping ability of your heart (left ventricular assist device, LVAD).  Heart transplant. This treatment is considered for certain people who do not improve with other treatments.  Where to find more information:  National Heart, Lung, and Blood Institute: ClickDebate.gl  Centers for Disease Control and Prevention: LawyerNetworking.com.cy  [facility]: https://www.[place].info/  American Heart Association: ReligiousCamps.at.jsp Contact a health care provider if:  You have rapid weight gain.  You have increasing shortness of breath that is unusual for you.  You tire easily, or you are unable to participate in your usual activities.  You cough more than normal, especially with physical activity.  You have any swelling or more swelling in areas such as your hands, feet, ankles, or abdomen. Summary  Heart failure can be prevented by making changes to your diet and your lifestyle.  It is important to eat a healthy diet, manage your weight, exercise regularly, manage stress, avoid drugs and alcohol, and keep your cholesterol and blood pressure under control.  Heart failure can cause very serious problems over time. This information is not intended to replace advice given to  you by your health care provider. Make sure you discuss any questions you have with your health care provider. Document Released: 07/30/2016 Document Revised: 02/25/2017 Document Reviewed: 07/30/2016 Elsevier Interactive Patient Education  2018 [employer].   Coronary Angiogram With Stent, Care After This sheet gives you information about how to care for yourself after your procedure. Your health care provider may also give you more specific instructions. If you have problems or questions, contact your health care provider. What can I expect after the procedure? After your procedure, it is common to have:  Bruising in the area where a small, thin tube (catheter) was inserted. This usually fades within 1-2 weeks.  Blood collecting in the tissue (hematoma) that may be painful to the touch. It should usually decrease in size and tenderness within 1-2 weeks.  Follow these instructions at home: Insertion area care  Do not take baths, swim, or use a hot tub until your health care provider approves.  You may shower 24-48 hours after the procedure or as directed by your health care provider.  Follow instructions from your health care provider about how to take care of your incision. Make sure you: ? Wash your hands with soap and water before you change your bandage (dressing). If soap and water are not available, use hand sanitizer. ? Change your dressing as told by your health care provider. ? Leave stitches (sutures), skin glue, or adhesive strips in place. These skin closures may need to stay in place for 2 weeks or longer. If adhesive strip edges start to loosen and curl up, you may trim the loose edges. Do not remove adhesive strips completely unless your health care provider tells you to do that.  Remove the bandage (dressing) and gently wash the catheter insertion site with plain soap and water.  Pat the area dry with a clean towel. Do not rub the area, because that may cause  bleeding.  Do not apply powder or lotion to the incision area.  Check your incision area every day for signs of infection. Check for: ? More redness, swelling, or pain. ? More fluid or blood. ? Warmth. ? Pus or a bad smell. Activity  Do not drive for 24 hours if you were given a medicine to help you relax (sedative).  Do not lift anything that is heavier than 10 lb (4.5 kg) for 5 days after your procedure or as directed by your health care provider.  Ask your health care provider when it is okay for you: ? To return to work or school. ? To resume usual physical activities or sports. ? To resume sexual activity. Eating and drinking  Eat a heart-healthy diet. This should include plenty of fresh fruits and vegetables.  Avoid the following types of food: ? Food that is high in salt. ? Canned or highly processed food. ? Food that is high in saturated fat or sugar. ? [employer].  Limit alcohol intake to no more than 1 drink a day for non-pregnant women and 2 drinks a day for men. One drink equals 12 oz of beer, 5 oz of wine, or 1 oz of hard liquor. Lifestyle  Do not use any products that contain nicotine or tobacco, such as cigarettes and e-cigarettes. If you need help quitting, ask your health care provider.  Take steps to manage and control your weight.  Get regular exercise.  Manage your blood pressure.  Manage other health problems, such as diabetes. General instructions  Take over-the-counter and prescription medicines only as told by your health care provider. Blood thinners may be prescribed after your procedure to improve blood flow through the stent.  If you need an MRI after your heart stent has been placed, be sure to tell the health care provider who orders the MRI that you have a heart stent.  Keep all follow-up visits as directed by your health care provider. This is important. Contact a health care provider if:  You have a fever.  You have chills.  You  have increased bleeding from the catheter insertion area. Hold pressure on the area. Get help right away if:  You develop chest pain or shortness of breath.  You feel faint or you pass out.  You have unusual pain at the catheter insertion area.  You have redness, warmth, or swelling at the catheter insertion area.  You have drainage (other than a small amount of blood on the dressing) from the catheter insertion area.  The catheter insertion area is bleeding, and the bleeding does not stop after 30 minutes of holding steady  pressure on the area.  You develop bleeding from any other place, such as from your rectum. There may be bright red blood in your urine or stool, or it may appear as black, tarry stool. This information is not intended to replace advice given to you by your health care provider. Make sure you discuss any questions you have with your health care provider. Document Released: 06/28/2005 Document Revised: 09/05/2016 Document Reviewed: 09/05/2016 Elsevier Interactive Patient Education  2018 [employer].   Coronary Artery Disease, Male Coronary artery disease (CAD) is a condition in which the arteries that lead to the heart (coronary arteries) become narrow or blocked. The narrowing or blockage can lead to decreased blood flow to the heart. Prolonged reduced blood flow can cause a heart attack (myocardial infarction or MI). This condition may also be called coronary heart disease. Because CAD is the leading cause of death in men, it is important to understand what causes this condition and how it is treated. What are the causes? CAD is most often caused by atherosclerosis. This is the buildup of fat and cholesterol (plaque) on the inside of the arteries. Over time, the plaque may narrow or block the artery, reducing blood flow to the heart. Plaque can also become weak and break off within a coronary artery and cause a sudden blockage. Other less common causes of CAD  include:  An embolism or blood clot in a coronary artery.  A tearing of the artery (spontaneous coronary artery dissection).  An aneurysm.  Inflammation (vasculitis) in the artery wall.  What increases the risk? The following factors may make you more likely to develop this condition:  Age. Men over age 24 are at a greater risk of CAD.  Family history of CAD.  Gender. Men often develop CAD earlier in life than women.  High blood pressure (hypertension).  Diabetes.  High cholesterol levels.  Tobacco use.  Excessive alcohol use.  Lack of exercise.  A diet high in saturated and trans fats, such as fried food and processed meat.  Other possible risk factors include:  High stress levels.  Depression.  Obesity.  Sleep apnea.  What are the signs or symptoms? Many people do not have any symptoms during the early stages of CAD. As the condition progresses, symptoms may include:  Chest pain (angina). The pain can: ? Feel like a crushing or squeezing, or a tightness, pressure, fullness, or heaviness in the chest. ? Last more than a few minutes or can stop and recur. The pain tends to get worse with exercise or stress and to fade with rest.  Pain in the arms, neck, jaw, or back.  Unexplained heartburn or indigestion.  Shortness of breath.  Nausea or vomiting.  Sudden light-headedness.  Sudden cold sweats.  Fluttering or fast heartbeat (palpitations).  How is this diagnosed? This condition is diagnosed based on:  Your family and medical history.  A physical exam.  Tests, including: ? A test to check the electrical signals in your heart (electrocardiogram). ? Exercise stress test. This looks for signs of blockage when the heart is stressed with exercise, such as running on a treadmill. ? Pharmacologic stress test. This test looks for signs of blockage when the heart is being stressed with a medicine. ? Blood tests. ? Coronary angiogram. This is a  procedure to look at the coronary arteries to see if there is any blockage. During this test, a dye is injected into your arteries so they appear on an X-ray. ?  A test that uses sound waves to take a picture of your heart (echocardiogram). ? Chest X-ray.  How is this treated? This condition may be treated by:  Healthy lifestyle changes to reduce risk factors.  Medicines such as: ? Antiplatelet medicines and blood-thinning medicines, such as aspirin. These help to prevent blood clots. ? Nitroglycerin. ? Blood pressure medicines. ? Cholesterol-lowering medicine.  Coronary angioplasty and stenting. During this procedure, a thin, flexible tube is inserted through a blood vessel and into a blocked artery. A balloon or similar device on the end of the tube is inflated to open up the artery. In some cases, a small, mesh tube (stent) is inserted into the artery to keep it open.  Coronary artery bypass surgery. During this surgery, veins or arteries from other parts of the body are used to create a bypass around the blockage and allow blood to reach your heart.  Follow these instructions at home: Medicines  Take over-the-counter and prescription medicines only as told by your health care provider.  Do not take the following medicines unless your health care provider approves: ? NSAIDs, such as ibuprofen, naproxen, or celecoxib. ? Vitamin supplements that contain vitamin A, vitamin E, or both. Lifestyle  Follow an exercise program approved by your health care provider. Aim for 150 minutes of moderate exercise or 75 minutes of vigorous exercise each week.  Maintain a healthy weight or lose weight as approved by your health care provider.  Rest when you are tired.  Learn to manage stress or try to limit your stress. Ask your health care provider for suggestions if you need help.  Get screened for depression and seek treatment, if needed.  Do not use any products that contain nicotine or  tobacco, such as cigarettes and e-cigarettes. If you need help quitting, ask your health care provider.  Do not use illegal drugs. Eating and drinking  Follow a heart-healthy diet. A dietitian can help educate you about healthy food options and changes. In general, eat plenty of fruits and vegetables, lean meats, and whole grains.  Avoid foods high in: ? Sugar. ? Salt (sodium). ? Saturated fat, such as processed or fatty meat. ? Trans fat, such as fried foods.  Use healthy cooking methods such as roasting, grilling, broiling, baking, poaching, steaming, or stir-frying.  If you drink alcohol, and your health care provider approves, limit your alcohol intake to no more than 2 drinks per day. One drink equals 12 ounces of beer, 5 ounces of wine, or 1 ounces of hard liquor. General instructions  Manage any other health conditions, such as hypertension and diabetes. These conditions affect your heart.  Your health care provider may ask you to monitor your blood pressure. Ideally, your blood pressure should be below 130/80.  Keep all follow-up visits as told by your health care provider. This is important. Get help right away if:  You have pain in your chest, neck, arm, jaw, stomach, or back that: ? Lasts more than a few minutes. ? Is recurring. ? Is not relieved by taking medicine under your tongue (sublingualnitroglycerin).  You have too much (profuse) sweating without cause.  You have unexplained: ? Heartburn or indigestion. ? Shortness of breath or difficulty breathing. ? Fluttering or fast heartbeat (palpitations). ? Nausea or vomiting. ? Fatigue. ? Feelings of nervousness or anxiety. ? Weakness. ? Diarrhea.  You have sudden light-headedness or dizziness.  You faint.  You feel like hurting yourself or think about taking your own life. These symptoms  may represent a serious problem that is an emergency. Do not wait to see if the symptoms will go away. Get medical help  right away. Call your local emergency services (911 in the [place] Do not drive yourself to the hospital. Summary  Coronary artery disease (CAD) is a process in which the arteries that lead to the heart (coronary arteries) become narrow or blocked. The narrowing or blockage can lead to a heart attack.  Many people do not have any symptoms during the early stages of CAD. This is called "silent CAD."  CAD can be treated with lifestyle changes, medicines, surgery, or a combination of these treatments. This information is not intended to replace advice given to you by your health care provider. Make sure you discuss any questions you have with your health care provider. Document Released: 07/06/2014 Document Revised: 11/29/2016 Document Reviewed: 11/29/2016 Elsevier Interactive Patient Education  2018 [employer].   Coronary Angioplasty, Care After This sheet gives you information about how to care for yourself after your procedure. Your health care provider may also give you more specific instructions. If you have problems or questions, contact your health care provider. What can I expect after the procedure? After your procedure, it is common to have:  Bruising at the catheter insertion site. This usually fades within 1-2 weeks.  Blood collecting in the tissue (hematoma) that may be painful to the touch. It should become smaller and less tender within 1-2 weeks.  Follow these instructions at home: Medicines  Take over-the-counter and prescription medicines only as told by your health care provider.  Blood thinners may be prescribed after your procedure to improve blood flow. Bathing  You may shower 24-48 hours after the procedure or as told by your health care provider.  Do not take baths, swim, or use a hot tub until your health care provider approves. Insertion site care  Follow instructions from your health care provider about how to take care of your insertion site. Make sure  you: ? Wash your hands with soap and water before you change your bandage (dressing). If soap and water are not available, use hand sanitizer. ? Change your dressing as told by your health care provider. ? Gently wash the site with plain soap and water. ? Use a clean towel to pat the area dry. ? Do not rub the site, because this may cause bleeding. ? Do not apply powder or lotion to the site.  Check your insertion site every day for signs of infection. Check for: ? More redness, swelling, or pain. ? More fluid or blood. ? Warmth. ? Pus or a bad smell. Lifestyle  Make any lifestyle changes as recommended by your health care provider. This may include: ? Not using any products that contain nicotine or tobacco, such as cigarettes and e-cigarettes. If you need help quitting, ask your health care provider. ? Managing your weight. ? Getting regular exercise. ? Managing your blood pressure. ? Limiting your alcohol intake. ? Managing other health problems, such as diabetes.  Eat a heart-healthy diet. This should include plenty of fresh fruits and vegetables. Avoid foods that are: ? High in salt (sodium). ? Canned or highly processed. ? High in saturated fat or sugar. ? Fried. General instructions  Do not lift over 10 lb (4.5 kg) for 5 days after your procedure or as told by your health care provider.  Ask your health care provider when it is okay to: ? Return to work or school. ? Resume  usual physical activities or sports. ? Resume sexual activity.  Keep all follow-up visits as told by your health care provider. This is important. Contact a health care provider if:  You have a fever.  You have chills.  You have increased bleeding from the insertion site. Hold pressure on the site. Get help right away if:  You develop chest pain or shortness of breath, feel faint, or pass out.  You have unusual pain at the insertion site.  You have redness, warmth, or swelling at the  insertion site.  You have drainage (other than a small amount of blood on the dressing) from the insertion site.  The insertion site is bleeding, and the bleeding does not stop after 30 minutes of holding steady pressure on the site.  You develop bleeding from any other place, such as from the rectum. There may be bright red blood in your urine or stool, or it may appear as black, tarry stool. This information is not intended to replace advice given to you by your health care provider. Make sure you discuss any questions you have with your health care provider. Document Released: 06/27/2005 Document Revised: 04/07/2017 Document Reviewed: 07/14/2016 Elsevier Interactive Patient Education  [employer].

## 2018-08-27 ENCOUNTER — Telehealth: Payer: Self-pay | Admitting: Cardiovascular Disease

## 2018-08-27 NOTE — Telephone Encounter (Signed)
Called the [employer] and got the patient scheduled for hospital followup on 09-15-18 with Miguel Garcia.  The patient has not been approved for transport yet, but, they believe he will be.

## 2018-09-01 ENCOUNTER — Telehealth: Payer: Self-pay | Admitting: Adult Health

## 2018-09-01 NOTE — Telephone Encounter (Signed)
New Message:   [facility] called to inform, the patient was referred to cardiac rehab and they can not permit the patient to attend due to security reasons.

## 2018-09-01 NOTE — Telephone Encounter (Signed)
Forwarded to K. West Pugh as Juluis Rainier, patient scheduled 9/24 post hospital follow up.

## 2018-09-01 NOTE — Telephone Encounter (Signed)
Thank you, that makes perfect sense. Will see on follow up.

## 2018-09-13 NOTE — Progress Notes (Signed)
Cardiology Office Note   Date:  09/15/2018   ID:  Dawood Spitler, DOB 1946-02-20, MRN [id number]  PCP:  Patient, No Pcp Per  Cardiologist:  Dr. Claiborne Billings  Chief Complaint  Patient presents with  . Shortness of Breath  . Chest Pain  . Coronary Artery Disease     History of Present Illness: Miguel Garcia is a 72 y.o. male who presents for posthospitalization follow-up, with known history of coronary artery disease, previous CABG, most recent catheterization April 2019 with moderate RCA disease, LIMA to LAD patent with complex stenting of the left main and large area of proximal and mid circumflex.  The patient was seen on consultation after experiencing 2 weeks of subscapular chest pain consistent with angina without EKG changes or elevated troponin.  The patient was scheduled for relook cardiac catheterization due to complexity of circumflex intervention in April 2019.  Cardiac catheterization was completed on 08/17/2018, this revealed significant coronary artery disease with 80% in-stent restenosis of the distal left main stent, old total ostial occlusion of the LAD, patent proximal left circumflex stent with 50% intimal hyperplasia in the mid circumflex stent followed by eccentric 90% stenosis distal to the mid circumflex stent, and 85% ostial stenosis in the OM1 vessel which is jailed by stent overlap of the proximal and mid previously placed stent.  The patient required cutting balloon PTCA of the distal left main stent with 80% stenosis being reduced to less than 15%; successful stenting of the distal circumflex with insertion of drug-eluting stent with 90% stenosis being reduced to 0%, and finally PTCA of the ostium of the OM1 vessel eccentric 85% stenosis reduced to 50%.  He was placed back on dual antiplatelet therapy, however he did have melena, which was felt to be related to Intracath anticoagulation.  The patient was seen by GI, RBC scan was negative for any acute GI bleeding, there was no  plans for EGD or colonoscopy.  It was suspected that the patient had a diverticular bleed but did not have any continued evidence of melena.  He was continued on Brilinta and aspirin,, beta-blocker Imdur and high dose statin therapy.   He is currently incarcerated and has a Engineer, structural with him today. He complains of ongoing GI bleeding with BM. He states that when he stops the ASA, the bleeding improves. He also has a chronic cough with minimal wheezing. He denies hemoptysis.   Past Medical History:  Diagnosis Date  . Anxiety   . Arthritis    "hands, lower back,; L4-5;  neck; C1-2" (03/31/2018)  . Asbestosis ([place])    "of my lungs"  . Asthma   . CAD (coronary artery disease)    CABG 14 days ago  . CAD S/P percutaneous coronary angioplasty 03/31/2018   LM PCI  . Chronic lower back pain   . Heart murmur   . High cholesterol   . Hypertension   . Myocardial infarction [facility]) 2006; 2007; 2012  . Prostate cancer [facility])    "put 3 beamers in my prostate in 11/2017 & radiation completed in 02/2018" (03/31/2018)  . Type II diabetes mellitus ([place])     Past Surgical History:  Procedure Laterality Date  . BACK SURGERY    . CORONARY ANGIOPLASTY WITH STENT PLACEMENT  03/31/2018  . CORONARY ARTERY BYPASS GRAFT  2007  . CORONARY BALLOON ANGIOPLASTY N/A 08/17/2018   Procedure: CORONARY BALLOON ANGIOPLASTY;  Surgeon: Troy Sine, MD;  Location: [place] [facility];  Service: Cardiovascular;  Laterality: N/A;  .  CORONARY STENT INTERVENTION N/A 03/31/2018   Procedure: CORONARY STENT INTERVENTION;  Surgeon: Troy Sine, MD;  Location: [place] [facility];  Service: Cardiovascular;  Laterality: N/A;  . CORONARY STENT INTERVENTION N/A 08/17/2018   Procedure: CORONARY STENT INTERVENTION;  Surgeon: Troy Sine, MD;  Location: [place] [facility];  Service: Cardiovascular;  Laterality: N/A;  . FOREARM FRACTURE SURGERY Right   . FRACTURE SURGERY    . HEMORRHOID BANDING    . LAPAROSCOPIC CHOLECYSTECTOMY     . LEFT HEART CATH AND CORONARY ANGIOGRAPHY N/A 03/31/2018   Procedure: LEFT HEART CATH AND CORONARY ANGIOGRAPHY;  Surgeon: Troy Sine, MD;  Location: [place] [facility];  Service: Cardiovascular;  Laterality: N/A;  . LEFT HEART CATH AND CORS/GRAFTS ANGIOGRAPHY N/A 08/17/2018   Procedure: LEFT HEART CATH AND CORS/GRAFTS ANGIOGRAPHY;  Surgeon: Troy Sine, MD;  Location: [place] [facility];  Service: Cardiovascular;  Laterality: N/A;  . ORIF SHOULDER FRACTURE Left   . POSTERIOR FUSION LUMBAR SPINE     L4-5  . PROSTATE BIOPSY     "put in 3 [employer] Transponder Implants in 11/2017"     Current Outpatient Medications  Medication Sig Dispense Refill  . albuterol (PROVENTIL HFA;VENTOLIN HFA) 108 (90 Base) MCG/ACT inhaler Inhale 2 puffs into the lungs 4 (four) times daily as needed for wheezing or shortness of breath.    Marland Kitchen aspirin EC 81 MG tablet Take 1 tablet (81 mg total) by mouth daily. 30 tablet 0  . atorvastatin (LIPITOR) 80 MG tablet Take 1 tablet (80 mg total) by mouth daily at 6 PM. 30 tablet 0  . chlorpheniramine (CHLOR-TRIMETON) 4 MG tablet Take 4 mg by mouth 4 (four) times daily as needed for allergies.    Marland Kitchen docusate sodium (COLACE) 100 MG capsule Take 100 mg by mouth 2 (two) times daily.    Marland Kitchen gabapentin (NEURONTIN) 300 MG capsule Take 300 mg by mouth 2 (two) times daily.    Marland Kitchen gemfibrozil (LOPID) 600 MG tablet Take 600 mg by mouth 2 (two) times daily.     Marland Kitchen glipiZIDE (GLUCOTROL XL) 10 MG 24 hr tablet Take 10 mg by mouth daily.     . isosorbide mononitrate (IMDUR) 60 MG 24 hr tablet Take 1 tablet (60 mg total) by mouth daily. 30 tablet 0  . metoprolol tartrate (LOPRESSOR) 50 MG tablet Take 1 tablet (50 mg total) by mouth daily. 30 tablet 0  . nitroGLYCERIN (NITROSTAT) 0.4 MG SL tablet Place 1 tablet (0.4 mg total) under the tongue every 5 (five) minutes as needed for chest pain. 30 tablet 11  . pantoprazole (PROTONIX) 40 MG tablet Take 1 tablet (40 mg total) by mouth daily at 6  (six) AM. 30 tablet 0  . polyethylene glycol (MIRALAX / GLYCOLAX) packet Take 17 g by mouth daily. Mix 17 grams powder in 4-8 oz of liquid until completely dissolved and drink once daily    . pramoxine-zinc (TRONOLANE) 1-5 % rectal cream Place 1 application rectally 2 (two) times daily.     . ticagrelor (BRILINTA) 90 MG TABS tablet Take 1 tablet (90 mg total) by mouth 2 (two) times daily. 60 tablet 0   No current facility-administered medications for this visit.     Allergies:   Patient has no known allergies.    Social History:  The patient  reports that he has never smoked. He has quit using smokeless tobacco.  His smokeless tobacco use included chew. He reports that he drank alcohol. He  reports that he has current or past drug history.   Family History:  The patient's family history includes Hypertension in his mother.    ROS: All other systems are reviewed and negative. Unless otherwise mentioned in H&P    PHYSICAL EXAM: VS:  BP 132/78   Pulse 76   Ht 5\' 8"  (1.727 m)   Wt 198 lb (89.8 kg)   BMI 30.11 kg/m  , BMI Body mass index is 30.11 kg/m. GEN: Well nourished, well developed, in no acute distress  HEENT: normal  Neck: no JVD, carotid bruits, or masses Cardiac: RRR; 2/6 systolic murmur,, rubs, or gallops,no edema  Respiratory: Clear to auscultation bilaterally, normal work of breathing, no wheezing, but occasional coughing is noted, which is non-productive  GI: soft, nontender, nondistended, + BS MS: no deformity or atrophy  Skin: warm and dry, no rash Neuro:  Strength and sensation are intact Psych: euthymic mood, full affect   EKG: Not completed this office visit.    Recent Labs: 08/19/2018: ALT 15; BUN 23; Creatinine, Ser 1.14; Hemoglobin 10.8; Platelets 144; Potassium 3.4; Sodium 139    Lipid Panel    Component Value Date/Time   CHOL 119 08/15/2018 0632   TRIG 80 08/15/2018 0632   HDL 34 (L) 08/15/2018 0632   CHOLHDL 3.5 08/15/2018 0632   VLDL 16  08/15/2018 0632   LDLCALC 69 08/15/2018 0632      Wt Readings from Last 3 Encounters:  09/15/18 198 lb (89.8 kg)  08/19/18 196 lb (88.9 kg)  04/13/18 192 lb 12.8 oz (87.5 kg)      Other studies Reviewed: Cardiac cath 08/17/2018 Conclusion     Ost LAD to Prox LAD lesion is 100% stenosed.  1st Diag lesion is 50% stenosed.  Prox LAD to Mid LAD lesion is 10% stenosed.  Ost 3rd Mrg lesion is 40% stenosed.  Previously placed Ost Cx to Prox Cx stent (unknown type) is widely patent.  Previously placed Prox Cx stent (unknown type) is widely patent.  Prox RCA lesion is 35% stenosed.  Mid RCA lesion is 30% stenosed.  Mid LM to Dist LM lesion is 80% stenosed.  Prox Cx to Mid Cx lesion is 15% stenosed.  A stent was successfully placed.  Post intervention, there is a 0% residual stenosis.  Mid Cx to Dist Cx lesion is 90% stenosed.  Post intervention, there is a 0% residual stenosis.  Ost 2nd Mrg lesion is 85% stenosed.  Post intervention, there is a 10% residual stenosis.  Post intervention, there is a 50% residual stenosis.   Significant of coronary obstructive disease with 80% in-stent restenosis in the distal left main stent.  Old total ostial occlusion of the LAD.  Patent proximal left circumflex stent with 15% intimal hyperplasia in the mid circumflex stent followed by eccentric 90% stenosis distal to the mid circumflex stent and 85% ostial stenosis in the OM1 vessel which is jailed by stent overlap of the proximal and mid  previously placed stents.  Dominant RCA with 35% smooth stenosis proximally and 30% stenosis in the mid segment.  Right and left internal mammary artery supplying the mid LAD with 50% narrowing in the diagonal vessel and patent proximal LAD stent.  Successful multilesion intervention with Cutting Balloon/PTCA of the distal left main stent with the 80% stenosis being reduced to less than 15%; successful stenting of the distal circumflex  with insertion of a 2.0 x 12 mm Resolute DES stent postdilated to 2.25 mm with a 90% stenosis being  reduced to 0%, and PTCA of the ostium of the OM1 vessel eccentric 85% stenosis reduced  to 50%.  RECOMMENDATION: Medical therapy for concomitant CAD.  High potency statin therapy with target LDL less than 70.  Optimal blood pressure control.  Recommend follow-up 2D echo Doppler study to reassess LV function. Recommend dual antiplatelet therapy with ASA 81 mg and Ticagrelor 90mg  twice daily long-term (beyond 12 months) because of multiple stents and left main involvement in this patient status post prior CABG revascularization.   Echocardiogram 08/15/2018 Left ventricle: The cavity size was normal. There was focal basal   hypertrophy. Systolic function was normal. The estimated ejection   fraction was in the range of 60% to 65%. Wall motion was normal;   there were no regional wall motion abnormalities. Features are   consistent with a pseudonormal left ventricular filling pattern,   with concomitant abnormal relaxation and increased filling   pressure (grade 2 diastolic dysfunction). Doppler parameters are   consistent with high ventricular filling pressure. - Aortic valve: Severely calcified annulus. Trileaflet; severely   thickened leaflets. 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 There   was mild regurgitation. Mean gradient (S): 21 mm Hg. VTI ratio of   LVOT to aortic valve: 0.28. Valve area (VTI): 0.96 cm^2. Valve   area (Vmax): 0.98 cm^2. Valve area (Vmean): 0.89 cm^2. - Mitral valve: Moderately calcified annulus. Mildly thickened   leaflets . There was mild regurgitation. - Left atrium: The atrium was severely dilated. - Right atrium: The atrium was moderately dilated. - Atrial septum: No defect or patent foramen ovale was identified. - Pulmonary arteries: Systolic pressure was mildly increased. PA   peak pressure: 33 mm Hg  (S).  ------------------------------------------------------------------- Labs, prior tests, procedures, and surgery: Coronary artery bypass grafting  ASSESSMENT AND PLAN:  1. CAD: Hx of CABG with recent re-cath on 08/17/2018 which revealed LM to to Dist LM lesion 80% in stent stenosed, requiring PCI . He has been placed on ASA and Brilinta. He continues to have issues with GI bleeding per his report. He stopped ASA temporarily and has resolution of bleeding, however once he restarted ASA, he noticed dark black stool and sone bleeding. He has had labs drawn to include FOB which was found to be positive for blood. We have asked for copies of this lab.   2. GI Bleed: Noted in the recent discharge note. GI did see him and did not feel he needed EGD or Colonoscopy at this time. I will stop ASA for now. I would recommend he see GI as OP for follow up. Will repeat labs in one month.   3. Hx of Asbestosis: Uses his inhaler but has not been seen by pulmonology. May need to consider referral for this for further recommendations if necessary.   4. Hypercholesterolemia: On high dose statin. Goal of LDL < 70. He has no control over his diet as he is incarcerated. We may need to add another agent or consider Repatha on next evaluation of lipids if they remain elevated.   Current medicines are reviewed at length with the patient today.    Labs/ tests ordered today include: Needs CBC and BMET via the correctional facility prior to next office visit, with results faxed to [place].    Phill Myron. West Pugh, ANP, AACC   09/15/2018 10:28 AM    [facility] [address], [place], [place] [address] Phone: [phone]; Fax: [phone]

## 2018-09-15 ENCOUNTER — Encounter: Payer: Self-pay | Admitting: Adult Health

## 2018-09-15 ENCOUNTER — Ambulatory Visit (INDEPENDENT_AMBULATORY_CARE_PROVIDER_SITE_OTHER): Admitting: Adult Health

## 2018-09-15 VITALS — BP 132/78 | HR 76 | Ht 68.0 in | Wt 198.0 lb

## 2018-09-15 DIAGNOSIS — E7800 Pure hypercholesterolemia, unspecified: Secondary | ICD-10-CM

## 2018-09-15 DIAGNOSIS — I2510 Atherosclerotic heart disease of native coronary artery without angina pectoris: Secondary | ICD-10-CM | POA: Diagnosis not present

## 2018-09-15 DIAGNOSIS — K922 Gastrointestinal hemorrhage, unspecified: Secondary | ICD-10-CM | POA: Diagnosis not present

## 2018-09-15 DIAGNOSIS — I10 Essential (primary) hypertension: Secondary | ICD-10-CM | POA: Diagnosis not present

## 2018-09-15 DIAGNOSIS — J61 Pneumoconiosis due to asbestos and other mineral fibers: Secondary | ICD-10-CM

## 2018-09-15 NOTE — Patient Instructions (Addendum)
Medication Instructions:  Stop ASA 81mg -D/T + OCCULT  If you need a refill on your cardiac medications before your next appointment, please call your pharmacy.  Labwork: BMET AND CBC AT FACILITY HERE IN OUR OFFICE AT [facility]  Take the provided lab slips with you to the lab for your blood draw.   You will NOT need to fast   Special Instructions: DISCUSSED VISIT W/JANIS AND SUPERVISOR-FAX PROGRESS NOTE WHEN FINISHED FOR WRITTEN ORDERS(TELE [phone] FAX [phone] IS THE MEDICAL DIVISION  Follow-Up: Your physician wants you to follow-up in: [place] DNP(NURSE PRACTITIONER),AACC IF PRIMARY CARDIOLOGIST (KELLY) IS UNAVAILABLE.   Thank you for choosing [facility] at [facility]!!

## 2018-10-15 ENCOUNTER — Other Ambulatory Visit: Payer: Self-pay

## 2018-10-15 ENCOUNTER — Encounter: Payer: Self-pay | Admitting: Physician Assistant

## 2018-10-15 ENCOUNTER — Ambulatory Visit (INDEPENDENT_AMBULATORY_CARE_PROVIDER_SITE_OTHER): Admitting: Physician Assistant

## 2018-10-15 VITALS — BP 110/70 | HR 63 | Ht 68.0 in | Wt 195.0 lb

## 2018-10-15 DIAGNOSIS — I10 Essential (primary) hypertension: Secondary | ICD-10-CM

## 2018-10-15 DIAGNOSIS — E785 Hyperlipidemia, unspecified: Secondary | ICD-10-CM | POA: Diagnosis not present

## 2018-10-15 DIAGNOSIS — I350 Nonrheumatic aortic (valve) stenosis: Secondary | ICD-10-CM

## 2018-10-15 DIAGNOSIS — I25810 Atherosclerosis of coronary artery bypass graft(s) without angina pectoris: Secondary | ICD-10-CM | POA: Diagnosis not present

## 2018-10-15 DIAGNOSIS — K921 Melena: Secondary | ICD-10-CM

## 2018-10-15 MED ORDER — METOPROLOL TARTRATE 25 MG PO TABS: 25.0000 mg | ORAL_TABLET | Freq: Two times a day (BID) | ORAL | 1 refills | Status: DC

## 2018-10-15 NOTE — Progress Notes (Signed)
Cardiology Office Note    Date:  10/15/2018   ID:  Miguel Garcia, DOB [birth year], MRN [id number]  PCP:  Miguel Garcia  Cardiologist:  Dr. Claiborne Billings  Chief Complaint  Patient presents with  . Follow-up    trouble with bleeding. Seen for Dr. Claiborne Billings.     History of Present Illness:  Miguel Garcia is a 72 y.o. male with PMH of CAD s/p CABG, HTN, HLD and anxiety.  Patient had a recent cardiac catheterization in April 2019 which showed moderate RCA disease, patent LIMA to LAD with complex stenting of the left main.  He was scheduled for relook cath due to complexity of the left circumflex intervention in April 2019.  Echocardiogram obtained on 08/15/2018 showed normal EF, moderate to severe aortic stenosis, mild MR, grade 2 DD, focal basal hypertrophy.  Cardiac catheterization was completed on 08/17/2018 which revealed significant coronary artery disease with 80% in-stent restenosis of the distal left main stent, old total ostial occlusion of the LAD, patent proximal left circumflex stent with 50% intimal hyperplasia in the middle of the mid left circumflex stent followed by eccentric 90% stenosis distal to the mid left circumflex stent, 85% ostial stenosis in the OM1 vessel which is jailed by the stent.  He required cutting balloon angioplasty of the distal left main stent with 80% stenosis reduced to less than 15%.  He underwent successful stenting of the distal left circumflex with insertion of the drug-eluting stent, he also underwent PTCA of the ostial OM1 reducing eccentric 85% stenosis down to 50%.  He was placed on dual antiplatelet therapy however had melena which was felt to be related to Intracath anticoagulation.  Patient was seen by GI.  RBC scan was negative for any acute GI bleed, there was no plan for EGD or colonoscopy.  It was suspected patient had a diverticular bleed but did not have any continued evidence of melena.  He was resumed on aspirin and Brilinta.  Patient was last seen by  Beckie Busing on 09/15/2018.  Garcia note, it appears patient temporarily held aspirin due to continued GI bleeding, he has since restarted aspirin which resulted in recurrent GI bleed.  Aspirin was stopped during the last office visit.  Patient presents for cardiology office visit.  He has not taken aspirin since the last visit.  However he continued to have both blood clot and bright red blood Garcia rectum on a daily basis.  I do not see the CBC and basic metabolic panel being done.  This will need to be done in the correctional facility and fax it to [place].  His blood pressure is borderline today.  It appears he is on 50 mg daily of metoprolol tartrate, I will change this to 25 mg twice daily dosing.  Otherwise he denies any recent chest pain or shortness of breath.  He has no lower extremity edema, orthopnea or PND.  He will need a close outpatient visit with gastroenterology service.  He can follow-up with Dr. Claiborne Billings in 33-month.  As for the moderate to severe aortic stenosis that was seen on the last echocardiogram.  I would recommend a repeat echocardiogram in February.  Surprisingly, on physical exam, I am able to hear the mitral valve murmur, however I do not hear the aortic valve murmur.   Past Medical History:  Diagnosis Date  . Anxiety   . Arthritis    "hands, lower back,; L4-5;  neck; C1-2" (03/31/2018)  . 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 Asthma   . CAD (coronary artery disease)    CABG 14 days ago  . CAD S/P percutaneous coronary angioplasty 03/31/2018   LM PCI  . Chronic lower back pain   . Heart murmur   . High cholesterol   . Hypertension   . Myocardial infarction [facility]) 2006; 2007; 2012  . Prostate cancer [facility])    "put 3 beamers in my prostate in 11/2017 & radiation completed in 02/2018" (03/31/2018)  . Type II diabetes mellitus ([place])     Past Surgical History:  Procedure Laterality Date  . BACK SURGERY    . CORONARY ANGIOPLASTY WITH STENT PLACEMENT  03/31/2018  . CORONARY  ARTERY BYPASS GRAFT  2007  . CORONARY BALLOON ANGIOPLASTY N/A 08/17/2018   Procedure: CORONARY BALLOON ANGIOPLASTY;  Surgeon: Troy Sine, MD;  Location: [place] [facility];  Service: Cardiovascular;  Laterality: N/A;  . CORONARY STENT INTERVENTION N/A 03/31/2018   Procedure: CORONARY STENT INTERVENTION;  Surgeon: Troy Sine, MD;  Location: [place] [facility];  Service: Cardiovascular;  Laterality: N/A;  . CORONARY STENT INTERVENTION N/A 08/17/2018   Procedure: CORONARY STENT INTERVENTION;  Surgeon: Troy Sine, MD;  Location: [place] [facility];  Service: Cardiovascular;  Laterality: N/A;  . FOREARM FRACTURE SURGERY Right   . FRACTURE SURGERY    . HEMORRHOID BANDING    . LAPAROSCOPIC CHOLECYSTECTOMY    . LEFT HEART CATH AND CORONARY ANGIOGRAPHY N/A 03/31/2018   Procedure: LEFT HEART CATH AND CORONARY ANGIOGRAPHY;  Surgeon: Troy Sine, MD;  Location: [place] [facility];  Service: Cardiovascular;  Laterality: N/A;  . LEFT HEART CATH AND CORS/GRAFTS ANGIOGRAPHY N/A 08/17/2018   Procedure: LEFT HEART CATH AND CORS/GRAFTS ANGIOGRAPHY;  Surgeon: Troy Sine, MD;  Location: [place] [facility];  Service: Cardiovascular;  Laterality: N/A;  . ORIF SHOULDER FRACTURE Left   . POSTERIOR FUSION LUMBAR SPINE     L4-5  . PROSTATE BIOPSY     "put in 3 [employer]ponder [employer]plants in 11/2017"    Current Medications: Outpatient Medications Prior to Visit  Medication Sig Dispense Refill  . albuterol (PROVENTIL HFA;VENTOLIN HFA) 108 (90 Base) MCG/ACT inhaler Inhale 2 puffs into the lungs 4 (four) times daily as needed for wheezing or shortness of breath.    Marland Kitchen aspirin EC 81 MG tablet Take 1 tablet (81 mg total) by mouth daily. 30 tablet 0  . atorvastatin (LIPITOR) 80 MG tablet Take 1 tablet (80 mg total) by mouth daily at 6 PM. 30 tablet 0  . chlorpheniramine (CHLOR-TRIMETON) 4 MG tablet Take 4 mg by mouth 4 (four) times daily as needed for allergies.    Marland Kitchen docusate sodium (COLACE) 100 MG  capsule Take 100 mg by mouth 2 (two) times daily.    Marland Kitchen gabapentin (NEURONTIN) 300 MG capsule Take 300 mg by mouth 2 (two) times daily.    Marland Kitchen gemfibrozil (LOPID) 600 MG tablet Take 600 mg by mouth 2 (two) times daily.     Marland Kitchen glipiZIDE (GLUCOTROL XL) 10 MG 24 hr tablet Take 10 mg by mouth daily.     . isosorbide mononitrate (IMDUR) 60 MG 24 hr tablet Take 1 tablet (60 mg total) by mouth daily. 30 tablet 0  . nitroGLYCERIN (NITROSTAT) 0.4 MG SL tablet Place 1 tablet (0.4 mg total) under the tongue every 5 (five) minutes as needed for chest pain. 30 tablet 11  . pantoprazole (PROTONIX) 40 MG tablet Take 1 tablet (40 mg total) by  mouth daily at 6 (six) AM. 30 tablet 0  . polyethylene glycol (MIRALAX / GLYCOLAX) packet Take 17 g by mouth daily. Mix 17 grams powder in 4-8 oz of liquid until completely dissolved and drink once daily    . pramoxine-zinc (TRONOLANE) 1-5 % rectal cream Place 1 application rectally 2 (two) times daily.     . ticagrelor (BRILINTA) 90 MG TABS tablet Take 1 tablet (90 mg total) by mouth 2 (two) times daily. 60 tablet 0  . metoprolol tartrate (LOPRESSOR) 50 MG tablet Take 1 tablet (50 mg total) by mouth daily. 30 tablet 0   No facility-administered medications prior to visit.      Allergies:   Lisinopril   Social History   Socioeconomic History  . Marital status: Divorced    Spouse name: Not on file  . Number of children: Not on file  . Years of education: Not on file  . Highest education level: Not on file  Occupational History  . Not on file  Social Needs  . Financial resource strain: Not on file  . Food insecurity:    Worry: Not on file    Inability: Not on file  . Transportation needs:    Medical: Not on file    Non-medical: Not on file  Tobacco Use  . Smoking status: Never Smoker  . Smokeless tobacco: Former Systems developer    Types: Chew  Substance and Sexual Activity  . Alcohol use: Not Currently  . Drug use: Not Currently  . Sexual activity: Not Currently    Lifestyle  . Physical activity:    Days Garcia week: Not on file    Minutes Garcia session: Not on file  . Stress: Not on file  Relationships  . Social connections:    Talks on phone: Not on file    Gets together: Not on file    Attends religious service: Not on file    Active member of club or organization: Not on file    Attends meetings of clubs or organizations: Not on file    Relationship status: Not on file  Other Topics Concern  . Not on file  Social History Narrative  . Not on file     Family History:  The patient's family history includes Hypertension in his mother.   ROS:   Please see the history of present illness.    ROS All other systems reviewed and are negative.   PHYSICAL EXAM:   VS:  BP 110/70   Pulse 63   Ht 5\' 8"  (1.727 m)   Wt 195 lb (88.5 kg)   SpO2 98%   BMI 29.65 kg/m    GEN: Well nourished, well developed, in no acute distress  HEENT: normal  Neck: no JVD, carotid bruits, or masses Cardiac: RRR; no murmurs, rubs, or gallops,no edema  Respiratory:  clear to auscultation bilaterally, normal work of breathing GI: soft, nontender, nondistended, + BS MS: no deformity or atrophy  Skin: warm and dry, no rash Neuro:  Alert and Oriented x 3, Strength and sensation are intact Psych: euthymic mood, full affect  Wt Readings from Last 3 Encounters:  10/15/18 195 lb (88.5 kg)  09/15/18 198 lb (89.8 kg)  08/19/18 196 lb (88.9 kg)      Studies/Labs Reviewed:   EKG:  EKG is not ordered today.   Recent Labs: 08/19/2018: ALT 15; BUN 23; Creatinine, Ser 1.14; Hemoglobin 10.8; Platelets 144; Potassium 3.4; Sodium 139   Lipid Panel    Component Value  Date/Time   CHOL 119 08/15/2018 0632   TRIG 80 08/15/2018 0632   HDL 34 (L) 08/15/2018 0632   CHOLHDL 3.5 08/15/2018 0632   VLDL 16 08/15/2018 0632   LDLCALC 69 08/15/2018 0632    Additional studies/ records that were reviewed today include:   Cath 03/31/2018  Prox RCA lesion is 30% stenosed.  Mid  RCA lesion is 30% stenosed.  Ost LAD to Prox LAD lesion is 100% stenosed.  Mid LM lesion is 95% stenosed.  Ost Cx to Prox Cx lesion is 60% stenosed.  Dist Cx lesion is 50% stenosed.  Ost 3rd Mrg lesion is 40% stenosed.  A stent was successfully placed.  Post intervention, there is a 0% residual stenosis.  Prox Cx lesion is 60% stenosed.  Post intervention, there is a 0% residual stenosis.  Post intervention, there is a 0% residual stenosis.  Post intervention, there is a 0% residual stenosis.  Prox Cx to Mid Cx lesion is 15% stenosed.  The left ventricular ejection fraction is 45-50% by visual estimate.  There is mild left ventricular systolic dysfunction.  1st Diag lesion is 70% stenosed.  Prox LAD to Mid LAD lesion is 10% stenosed.  A stent was successfully placed.   Severe native coronary artery disease with 95% stenosis with mild to moderate calcification in the distal left main; total occlusion of the ostium of the LAD; patent small ramus intermediate vessel; and 60% ostial stenosis extending from the left main stenosis of the circumflex with 60% proximal in-stent renarrowing in the previously placed circumflex stent proximal to a large OM vessel.  There is diffuse 50% stenoses in the distal circumflex beyond the stented segment.;  Large dominant RCA with 30% proximal and mid focal narrowing.  Patent LIMA graft supplying the mid LAD with excellent filling of the LAD up to its ostium and 70% focal stenosis in the mid first diagonal vessel.  Supravalvular aortography did not demonstrate any vein grafts arising from the aorta.  Difficult but successful complex intervention in the protected left main requiring initial cutting balloon with 2.0 x 6 and 2.5 x 6 mm Wolverine balloons, PTCA and ultimate DES stenting from the left main ostium extending to the proximal circumflex with stent overlap and ending just proximal to the takeoff of a large marginal branch with post  stent dilatation up to 3.5 mm in the proximal circumflex and up to 4.1 mm in the left main the entire stenoses being reduced to 0% and brisk TIMI-3 flow.  RECOMMENDATION: DAPT therapy with aspirin/Brilinta must be continued for a minimum of 1 year and would recommend indefinitely.  Medical therapy for concomitant CAD.   Echo 08/15/2018 LV EF: 60% -   65% Study Conclusions  - Left ventricle: The cavity size was normal. There was focal basal   hypertrophy. Systolic function was normal. The estimated ejection   fraction was in the range of 60% to 65%. Wall motion was normal;   there were no regional wall motion abnormalities. Features are   consistent with a pseudonormal left ventricular filling pattern,   with concomitant abnormal relaxation and increased filling   pressure (grade 2 diastolic dysfunction). Doppler parameters are   consistent with high ventricular filling pressure. - Aortic valve: Severely calcified annulus. Trileaflet; severely   thickened leaflets. 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 There   was mild regurgitation. Mean gradient (S): 21 mm Hg. VTI ratio of   LVOT to aortic valve: 0.28. Valve area (VTI): 0.96 cm^2. Valve  area (Vmax): 0.98 cm^2. Valve area (Vmean): 0.89 cm^2. - Mitral valve: Moderately calcified annulus. Mildly thickened   leaflets . There was mild regurgitation. - Left atrium: The atrium was severely dilated. - Right atrium: The atrium was moderately dilated. - Atrial septum: No defect or patent foramen ovale was identified. - Pulmonary arteries: Systolic pressure was mildly increased. PA   peak pressure: 33 mm Hg (S).    Cath 08/17/2018  Ost LAD to Prox LAD lesion is 100% stenosed.  1st Diag lesion is 50% stenosed.  Prox LAD to Mid LAD lesion is 10% stenosed.  Ost 3rd Mrg lesion is 40% stenosed.  Previously placed Ost Cx to Prox Cx stent (unknown type) is widely patent.  Previously placed Prox Cx stent (unknown type) is widely  patent.  Prox RCA lesion is 35% stenosed.  Mid RCA lesion is 30% stenosed.  Mid LM to Dist LM lesion is 80% stenosed.  Prox Cx to Mid Cx lesion is 15% stenosed.  A stent was successfully placed.  Post intervention, there is a 0% residual stenosis.  Mid Cx to Dist Cx lesion is 90% stenosed.  Post intervention, there is a 0% residual stenosis.  Ost 2nd Mrg lesion is 85% stenosed.  Post intervention, there is a 10% residual stenosis.  Post intervention, there is a 50% residual stenosis.   Significant of coronary obstructive disease with 80% in-stent restenosis in the distal left main stent.  Old total ostial occlusion of the LAD.  Patent proximal left circumflex stent with 15% intimal hyperplasia in the mid circumflex stent followed by eccentric 90% stenosis distal to the mid circumflex stent and 85% ostial stenosis in the OM1 vessel which is jailed by stent overlap of the proximal and mid  previously placed stents.  Dominant RCA with 35% smooth stenosis proximally and 30% stenosis in the mid segment.  Right and left internal mammary artery supplying the mid LAD with 50% narrowing in the diagonal vessel and patent proximal LAD stent.  Successful multilesion intervention with Cutting Balloon/PTCA of the distal left main stent with the 80% stenosis being reduced to less than 15%; successful stenting of the distal circumflex with insertion of a 2.0 x 12 mm Resolute DES stent postdilated to 2.25 mm with a 90% stenosis being reduced to 0%, and PTCA of the ostium of the OM1 vessel eccentric 85% stenosis reduced  to 50%.  RECOMMENDATION: Medical therapy for concomitant CAD.  High potency statin therapy with target LDL less than 70.  Optimal blood pressure control.  Recommend follow-up 2D echo Doppler study to reassess LV function. Recommend dual antiplatelet therapy with ASA 81 mg and Ticagrelor 90mg  twice daily long-term (beyond 12 months) because of multiple stents and left main  involvement in this patient status post prior CABG revascularization.    ASSESSMENT:    1. Coronary artery disease involving coronary bypass graft of native heart without angina pectoris   2. Gastrointestinal hemorrhage with melena   3. Essential hypertension   4. Hyperlipidemia, unspecified hyperlipidemia type   5. Nonrheumatic aortic valve stenosis      PLAN:  In order of problems listed above:  1. CAD s/p CABG: Recent left main stenting.  However had a restenosis.  Unfortunately due to GI bleed, he had to stop aspirin during the last office visit.  He is on Brilinta monotherapy at this time.  We will need to monitor closely for any recurrence of chest pain.  I will make Dr. Claiborne Billings aware that he is not  on aspirin anymore.  2. GI bleed: He continued to have GI bleed despite discontinuing the aspirin.  Currently on Brilinta monotherapy.  He will need a CBC and metabolic panel at the correctional facility and send the result to [place] (our fax number [phone]).  It is imperative for him to continue on the Brilinta, as closure of the left main stent will be catastrophic for him.  3. Hypertension: Blood pressure stable.  He is on metoprolol 50 mg daily, will change this medication to 25 mg twice daily.  4. Hyperlipidemia: Continue on Lipitor 80 mg daily.  5. History of moderate to severe aortic stenosis: Seen on recent echocardiogram in August.  However he does not have any sign of volume overload.  He is largely asymptomatic from this.  Recommend repeat echocardiogram in 6 months prior to his next office visit with Dr. Claiborne Billings.    Medication Adjustments/Labs and Tests Ordered: Current medicines are reviewed at length with the patient today.  Concerns regarding medicines are outlined above.  Medication changes, Labs and Tests ordered today are listed in the Patient Instructions below. Patient Instructions  Medication Instructions:  Change Metoprolol to 25 mg twice daily If you need a  refill on your cardiac medications before your next appointment, please call your pharmacy.   Lab work: CBC, BMET Please send results to fax number [phone] attention Miguel Deforest, PA  Testing/Procedures: Your physician has requested that you have an echocardiogram. Echocardiography is a painless test that uses sound waves to create images of your heart. It provides your doctor with information about the size and shape of your heart and how well your heart's chambers and valves are working. This procedure takes approximately one hour. There are no restrictions for this procedure.   Follow-Up: At [facility], you and your health needs are our priority.  As part of our continuing mission to provide you with exceptional heart care, we have created designated Provider Care Teams.  These Care Teams include your primary Cardiologist (physician) and Advanced Practice Providers (APPs -  Physician Assistants and Nurse Practitioners) who all work together to provide you with the care you need, when you need it. . Follow up with Dr.Kelly in 3-4 months   Any Other Special Instructions Will Be Listed Below (If Applicable). Repeat ECHO before the follow up appointment with [facility]         Signed, Miguel Garcia, [place]  10/15/2018 9:27 AM    [address], [place], [place]  [address] Phone: [phone]; Fax: [phone]

## 2018-10-15 NOTE — Patient Instructions (Addendum)
Medication Instructions:  Change Metoprolol to 25 mg twice daily If you need a refill on your cardiac medications before your next appointment, please call your pharmacy.   Lab work: CBC, BMET Please send results to fax number [phone] attention Almyra Deforest, PA  Testing/Procedures: Your physician has requested that you have an echocardiogram. Echocardiography is a painless test that uses sound waves to create images of your heart. It provides your doctor with information about the size and shape of your heart and how well your heart's chambers and valves are working. This procedure takes approximately one hour. There are no restrictions for this procedure.   Follow-Up: At [facility], you and your health needs are our priority.  As part of our continuing mission to provide you with exceptional heart care, we have created designated Provider Care Teams.  These Care Teams include your primary Cardiologist (physician) and Advanced Practice Providers (APPs -  Physician Assistants and Nurse Practitioners) who all work together to provide you with the care you need, when you need it. . Follow up with Dr.Kelly in 3-4 months   Any Other Special Instructions Will Be Listed Below (If Applicable). Repeat ECHO before the follow up appointment with [facility]

## 2018-10-22 ENCOUNTER — Other Ambulatory Visit (HOSPITAL_COMMUNITY)

## 2018-11-06 ENCOUNTER — Telehealth: Payer: Self-pay | Admitting: *Deleted

## 2018-11-06 NOTE — Telephone Encounter (Signed)
I called and s/w Mrs. Miguel Garcia at [facility]. I called to verify the procedure date states as 11/11/19 though fax states urgent. Mrs. Miguel Garcia states to me the pt just signed a refusal to not have procedure. I confirmed that we do not need to get medical clearance at this time.

## 2019-01-18 ENCOUNTER — Encounter: Payer: Self-pay | Admitting: Physician Assistant

## 2019-01-21 ENCOUNTER — Ambulatory Visit (INDEPENDENT_AMBULATORY_CARE_PROVIDER_SITE_OTHER): Admitting: Cardiovascular Disease

## 2019-01-21 ENCOUNTER — Encounter: Payer: Self-pay | Admitting: Cardiovascular Disease

## 2019-01-21 VITALS — BP 140/72 | HR 72 | Ht 68.0 in | Wt 194.0 lb

## 2019-01-21 DIAGNOSIS — I350 Nonrheumatic aortic (valve) stenosis: Secondary | ICD-10-CM

## 2019-01-21 DIAGNOSIS — I10 Essential (primary) hypertension: Secondary | ICD-10-CM

## 2019-01-21 DIAGNOSIS — E785 Hyperlipidemia, unspecified: Secondary | ICD-10-CM

## 2019-01-21 DIAGNOSIS — I25810 Atherosclerosis of coronary artery bypass graft(s) without angina pectoris: Secondary | ICD-10-CM

## 2019-01-21 NOTE — Patient Instructions (Signed)
Medication Instructions:  The current medical regimen is effective;  continue present plan and medications.  If you need a refill on your cardiac medications before your next appointment, please call your pharmacy.   Lab work: Fasting labs in a few weeks (CMET, CBC, TSH, LIPID)  If you have labs (blood work) drawn today and your tests are completely normal, you will receive your results only by: Marland Kitchen MyChart Message (if you have MyChart) OR . A paper copy in the mail If you have any lab test that is abnormal or we need to change your treatment, we will call you to review the results.  Testing/Procedures: Echocardiogram (6 months at a [facility] if possible) - Your physician has requested that you have an echocardiogram. Echocardiography is a painless test that uses sound waves to create images of your heart. It provides your doctor with information about the size and shape of your heart and how well your heart's chambers and valves are working. This procedure takes approximately one hour. There are no restrictions for this procedure. This will be performed at our [facility] [address] location    Follow-Up: At [facility], you and your health needs are our priority.  As part of our continuing mission to provide you with exceptional heart care, we have created designated Provider Care Teams.  These Care Teams include your primary Cardiologist (physician) and Advanced Practice Providers (APPs -  Physician Assistants and Nurse Practitioners) who all work together to provide you with the care you need, when you need it. You will need a follow up appointment in 6 months.  Please call our office 2 months in advance to schedule this appointment.  You may see Dr.Kelly or one of the following Advanced Practice Providers on your designated Care Team: Almyra Deforest, [place] . Fabian Sharp, PA-C

## 2019-01-21 NOTE — Progress Notes (Signed)
Cardiology Office Note    Date:  01/23/2019   ID:  Miguel Garcia, DOB [birth year], MRN [id number]  PCP:  Patient, No Pcp Per  Cardiologist:  Shelva Majestic, MD   Chief Complaint  Patient presents with  . Follow-up    3 months.   Initial office visit following hospitalization in April and August 2019  History of Present Illness:  Miguel Garcia is a 73 y.o. male who is incarcerated presents for his initial post hospital office visit with me following 2 cardiac catheterizations in 2019.  Miguel Garcia underwent initial CABG revascularization surgery approximately 15 years ago and underwent subsequent stenting to his distal circumflex.  He has been residing at [facility].  2019 he developed symptom complex worrisome for unstable angina.  He was transferred to [facility] where I performed cardiac catheterization which revealed total LAD occlusion with a patent LIMA graft supplying the LAD territory.  He had 95% distal left main calcified stenosis with high-grade proximal stenosis and underwent successful stenting to his left main into the circumflex vessel.  A previously placed mid circumflex stent was patent.  He had done well until August 2019 Velp recurrent chest pain and was noted to have guaiac positive stools.  Repeat catheterization showed 80% in-stent restenosis in the distal left main stent with old total ostial occlusion of the LAD.  He had a patent proximal left circumflex stent with 15% intimal hyperplasia in the mid circumflex stent followed by eccentric 90% stenosis distal to the mid circumflex stent and 85% ostial stenosis in an OM1 vessel which was jailed by stent overlap of the proximal to mid previously placed circumflex stents.  His RCA was dominant with 35% smooth stenosis proximally and 30% stenosis in the midsegment.  He had a patent LIMA graft supplying the mid LAD.  He underwent successful multi lesion intervention with Cutting Balloon/PTCA of his distal  left main stent with the 80% stenosis being reduced to less than 15%, successful stenting of the distal circumflex and a 2.0 x 12 mm Resolute stent was inserted postdilated to 2.25 mm with a 90% stenosis reduced to 0%, and PTCA of the ostium of the OM1 vessel with the 80% 5% stenosis being reduced to 50%.  He has been on medical therapy for his concomitant CAD.  Going to his evaluations he has been seen by [name], NP in the office.  Of note, while he was in the hospital in August an echo Doppler study showed an EF of 60 to 65% with grade 2 diastolic dysfunction.  He had at least moderate aortic stenosis with a mean gradient of 21 and peak gradient of 35.  Aortic valve area was 0.98 cm.  When he was seen by [name] had recommended a subsequent echo which apparently was done by the state and interpreted at [facility] in [place] .  I reviewed this report.  Of note, there was no mention of aortic stenosis.  LV function was normal at 55% there was mild TR, moderate MR and mild aortic insufficiency.  Ells me since his hospitalization he had undergone endoscopy and colonoscopy and his previous mild positive stools were due to her radiation and he did not have any colonic mass or polyps. Presently, Miguel [facility] has [facility].  He denies recurrent anginal type symptoms.  He presents for evaluation with me in the office today.  Past Medical History:  Diagnosis Date  . Anxiety   .  Arthritis    "hands, lower back,; L4-5;  neck; C1-2" (03/31/2018)  . Asbestosis ([place])    "of my lungs"  . Asthma   . CAD (coronary artery disease)    CABG 14 days ago  . CAD S/P percutaneous coronary angioplasty 03/31/2018   LM PCI  . Chronic lower back pain   . Heart murmur   . High cholesterol   . Hypertension   . Myocardial infarction [facility]) 2006; 2007; 2012  . Prostate cancer [facility])    "put 3 beamers in my prostate in 11/2017 & radiation completed in 02/2018"  (03/31/2018)  . Type II diabetes mellitus ([place])     Past Surgical History:  Procedure Laterality Date  . BACK SURGERY    . CORONARY ANGIOPLASTY WITH STENT PLACEMENT  03/31/2018  . CORONARY ARTERY BYPASS GRAFT  2007  . CORONARY BALLOON ANGIOPLASTY N/A 08/17/2018   Procedure: CORONARY BALLOON ANGIOPLASTY;  Surgeon: Troy Sine, MD;  Location: [place] [facility];  Service: Cardiovascular;  Laterality: N/A;  . CORONARY STENT INTERVENTION N/A 03/31/2018   Procedure: CORONARY STENT INTERVENTION;  Surgeon: Troy Sine, MD;  Location: [place] [facility];  Service: Cardiovascular;  Laterality: N/A;  . CORONARY STENT INTERVENTION N/A 08/17/2018   Procedure: CORONARY STENT INTERVENTION;  Surgeon: Troy Sine, MD;  Location: [place] [facility];  Service: Cardiovascular;  Laterality: N/A;  . FOREARM FRACTURE SURGERY Right   . FRACTURE SURGERY    . HEMORRHOID BANDING    . LAPAROSCOPIC CHOLECYSTECTOMY    . LEFT HEART CATH AND CORONARY ANGIOGRAPHY N/A 03/31/2018   Procedure: LEFT HEART CATH AND CORONARY ANGIOGRAPHY;  Surgeon: Troy Sine, MD;  Location: [place] [facility];  Service: Cardiovascular;  Laterality: N/A;  . LEFT HEART CATH AND CORS/GRAFTS ANGIOGRAPHY N/A 08/17/2018   Procedure: LEFT HEART CATH AND CORS/GRAFTS ANGIOGRAPHY;  Surgeon: Troy Sine, MD;  Location: [place] [facility];  Service: Cardiovascular;  Laterality: N/A;  . ORIF SHOULDER FRACTURE Left   . POSTERIOR FUSION LUMBAR SPINE     L4-5  . PROSTATE BIOPSY     "put in 3 [employer] Transponder Implants in 11/2017"    Current Medications: Outpatient Medications Prior to Visit  Medication Sig Dispense Refill  . albuterol (PROVENTIL HFA;VENTOLIN HFA) 108 (90 Base) MCG/ACT inhaler Inhale 2 puffs into the lungs 4 (four) times daily as needed for wheezing or shortness of breath.    Marland Kitchen atorvastatin (LIPITOR) 80 MG tablet Take 1 tablet (80 mg total) by mouth daily at 6 PM. 30 tablet 0  . chlorpheniramine (CHLOR-TRIMETON) 4 MG  tablet Take 4 mg by mouth 4 (four) times daily as needed for allergies.    Marland Kitchen docusate sodium (COLACE) 100 MG capsule Take 100 mg by mouth 2 (two) times daily.    . ferrous gluconate (FERGON) 324 MG tablet Take 324 mg by mouth daily with breakfast.    . gabapentin (NEURONTIN) 600 MG tablet Take 600 mg by mouth 2 (two) times daily.    Marland Kitchen gemfibrozil (LOPID) 600 MG tablet Take 600 mg by mouth 2 (two) times daily.     Marland Kitchen glipiZIDE (GLUCOTROL XL) 10 MG 24 hr tablet Take 10 mg by mouth daily.     Marland Kitchen guaiFENesin (MUCINEX) 600 MG 12 hr tablet Take 600 mg by mouth 2 (two) times daily.    Marland Kitchen HYDROCORTISONE, TOPICAL, 2.5 % SOLN Apply topically.    . isosorbide mononitrate (IMDUR) 60 MG 24 hr tablet Take 1 tablet (60 mg  total) by mouth daily. 30 tablet 0  . metoprolol tartrate (LOPRESSOR) 25 MG tablet Take 1 tablet (25 mg total) by mouth 2 (two) times daily. 180 tablet 1  . nitroGLYCERIN (NITROSTAT) 0.4 MG SL tablet Place 1 tablet (0.4 mg total) under the tongue every 5 (five) minutes as needed for chest pain. 30 tablet 11  . pantoprazole (PROTONIX) 40 MG tablet Take 1 tablet (40 mg total) by mouth daily at 6 (six) AM. 30 tablet 0  . polyethylene glycol (MIRALAX / GLYCOLAX) packet Take 17 g by mouth daily. Mix 17 grams powder in 4-8 oz of liquid until completely dissolved and drink once daily    . pramoxine-zinc (TRONOLANE) 1-5 % rectal cream Place 1 application rectally 2 (two) times daily.     . ticagrelor (BRILINTA) 90 MG TABS tablet Take 1 tablet (90 mg total) by mouth 2 (two) times daily. 60 tablet 0  . aspirin EC 81 MG tablet Take 1 tablet (81 mg total) by mouth daily. 30 tablet 0  . gabapentin (NEURONTIN) 300 MG capsule Take 300 mg by mouth 2 (two) times daily.     No facility-administered medications prior to visit.      Allergies:   Lisinopril   Social History   Socioeconomic History  . Marital status: Divorced    Spouse name: Not on file  . Number of children: Not on file  . Years of  education: Not on file  . Highest education level: Not on file  Occupational History  . Not on file  Social Needs  . Financial resource strain: Not on file  . Food insecurity:    Worry: Not on file    Inability: Not on file  . Transportation needs:    Medical: Not on file    Non-medical: Not on file  Tobacco Use  . Smoking status: Never Smoker  . Smokeless tobacco: Former Systems developer    Types: Chew  Substance and Sexual Activity  . Alcohol use: Not Currently  . Drug use: Not Currently  . Sexual activity: Not Currently  Lifestyle  . Physical activity:    Days per week: Not on file    Minutes per session: Not on file  . Stress: Not on file  Relationships  . Social connections:    Talks on phone: Not on file    Gets together: Not on file    Attends religious service: Not on file    Active member of club or organization: Not on file    Attends meetings of clubs or organizations: Not on file    Relationship status: Not on file  Other Topics Concern  . Not on file  Social History Narrative  . Not on file   Family History:  The patient's family history includes Hypertension in his mother.   ROS General: Negative; No fevers, chills, or night sweats;  HEENT: Negative; No changes in vision or hearing, sinus congestion, difficulty swallowing Pulmonary: Negative; No cough, wheezing, shortness of breath, hemoptysis Cardiovascular: Negative; No chest pain, presyncope, syncope, palpitations GI: Negative; No nausea, vomiting, diarrhea, or abdominal pain GU: Negative; No dysuria, hematuria, or difficulty voiding Musculoskeletal: Negative; no myalgias, joint pain, or weakness Hematologic/Oncology: Negative; no easy bruising, bleeding Endocrine: Negative; no heat/cold intolerance; no diabetes Neuro: Negative; no changes in balance, headaches Skin: Negative; No rashes or skin lesions Psychiatric: Negative; No behavioral problems, depression Sleep: Negative; No snoring, daytime sleepiness,  hypersomnolence, bruxism, restless legs, hypnogognic hallucinations, no cataplexy Other comprehensive 14 point system review  is negative.   PHYSICAL EXAM:   VS:  BP 140/72 (BP Location: Right Arm, Patient Position: Sitting, Cuff Size: Normal)   Pulse 72   Ht _0  (1.727 m)   Wt 194 lb (88 kg)   BMI 29.50 kg/m     Repeat blood pressure by me 140/74  Wt Readings from Last 3 Encounters:  01/21/19 194 lb (88 kg)  10/15/18 195 lb (88.5 kg)  09/15/18 198 lb (89.8 kg)    General: Alert, oriented, no distress.  Skin: normal turgor, no rashes, warm and dry HEENT: Normocephalic, atraumatic. Pupils equal round and reactive to light; sclera anicteric; extraocular muscles intact;  Nose without nasal septal hypertrophy Mouth/Parynx benign; Mallinpatti scale 3 Neck: No JVD, no carotid bruits; normal carotid upstroke Lungs: clear to ausculatation and percussion; no wheezing or rales Chest wall: without tenderness to palpitation Heart: PMI not displaced, RRR, s1 s2 normal, 8-8/9 systolic murmur, no diastolic murmur, no rubs, gallops, thrills, or heaves Abdomen: soft, nontender; no hepatosplenomehaly, BS+; abdominal aorta nontender and not dilated by palpation. Back: no CVA tenderness Pulses 2+ Musculoskeletal: full range of motion, normal strength, no joint deformities Extremities: no clubbing cyanosis or edema, Homan's sign negative  Neurologic: grossly nonfocal; Cranial nerves grossly wnl Psychologic: Normal mood and affect   Studies/Labs Reviewed:   EKG:  EKG is ordered today.  ECG (independently read by me): Normal sinus rhythm at 72 bpm.  Inferior Q waves in lead III.  T wave abnormality in leads I and aVL.  Normal intervals.  Recent Labs: BMP Latest Ref Rng & Units 08/19/2018 08/18/2018 08/17/2018  Glucose 70 - 99 mg/dL 122(H) 114(H) 121(H)  BUN 8 - 23 mg/dL _1 Creatinine 0.61 - 1.24 mg/dL 1.14 0.96 1.04  Sodium 135 - 145 mmol/L 139 139 139  Potassium 3.5 - 5.1 mmol/L 3.4(L)  3.8 3.8  Chloride 98 - 111 mmol/L 107 108 107  CO2 22 - 32 mmol/L _2 Calcium 8.9 - 10.3 mg/dL 9.4 9.5 9.5     Hepatic Function Latest Ref Rng & Units 08/19/2018 04/01/2018  Total Protein 6.5 - 8.1 g/dL 6.3(L) 5.6(L)  Albumin 3.5 - 5.0 g/dL 3.5 3.0(L)  AST 15 - 41 U/L 20 30  ALT 0 - 44 U/L 15 28  Alk Phosphatase 38 - 126 U/L 66 62  Total Bilirubin 0.3 - 1.2 mg/dL 0.8 0.5  Bilirubin, Direct 0.1 - 0.5 mg/dL - 0.1    CBC Latest Ref Rng & Units 08/19/2018 08/18/2018 08/18/2018  WBC 4.0 - 10.5 K/uL 5.0 5.5 4.7  Hemoglobin 13.0 - 17.0 g/dL 10.8(L) 12.9(L) 11.3(L)  Hematocrit 39.0 - 52.0 % 31.3(L) 38.4(L) 33.3(L)  Platelets 150 - 400 K/uL 144(L) 179 141(L)   Lab Results  Component Value Date   MCV 93.2 08/19/2018   MCV 93.9 08/18/2018   MCV 93.5 08/18/2018   No results found for: TSH Lab Results  Component Value Date   HGBA1C 6.1 (H) 08/15/2018     BNP No results found for: BNP  ProBNP No results found for: PROBNP   Lipid Panel     Component Value Date/Time   CHOL 119 08/15/2018 0632   TRIG 80 08/15/2018 0632   HDL 34 (L) 08/15/2018 0632   CHOLHDL 3.5 08/15/2018 0632   VLDL 16 08/15/2018 0632   LDLCALC 69 08/15/2018 1694     RADIOLOGY: No results found.   Additional studies/ records that were reviewed today include:  Reviewed his prior cardiac catheterization  reports and interventions, echo Doppler study from [facility] as well as from the state and subsequent office visit with [name] and Francia Greaves, DO.  ASSESSMENT:    1. Essential hypertension   2. Coronary artery disease involving coronary bypass graft of native heart without angina pectoris   3. Hyperlipidemia with target LDL less than 70   4. Nonrheumatic aortic valve stenosis      PLAN:  Miguel Garcia is a 73-year-old gentleman who has been incarcerated for approximately 12 years.  He has undergone initial CABG revascularization surgery with subsequent interventions.  I  reviewed his most recent catheterization in April with subsequent intervention as well as his most recent 1 performed by me in August 2019.  Since his last intervention he has been without anginal symptoms and he underwent successful cutting balloon/TCA of his distal left main stent as well as insertion of a new distal circumflex stent and PTCA of an OM1 stenosis jailed by his previous proximal to mid stents.  He is currently without anginal symptomatology.  Apparently has a issue in the past with radiation leading to some rare episodes of guaiac positive stools.  He is not on any aspirin therapy but is on Brilinta for antiplatelet therapy.  His blood pressure today is elevated based on new hypertensive guidelines.  I have recommended the addition of losartan 25 mg to his medical regimen which already consists of isosorbide 60 mg and metoprolol tartrate 25 mg twice a day.  He continues to be on atorvastatin for hyperlipidemia and apparently in the past has been his treated long-term with gemfibrozil.  Target LDL is less than 70.  He is diabetic on glipizide.  He has a history of neuropathy for which he takes gabapentin.  I reviewed his echo Doppler study from August which suggested at least moderate to moderately severe aortic stenosis.  Apparently this was not demonstrated on his most recent echo which has been interpreted in [place].  I have suggested that in 6 months he undergo a follow-up echo Doppler study through the [facility] system if at all possible.  I am recommending laboratory be checked in a fasting state consisting of a C met, CBC, TSH and fasting lipid panel.  May ultimately be possible to discontinue gemfibrozil and if triglycerides are elevated change to the Vascepa has improved outcome data.  I will see him in 6 months for reevaluation.  Medication Adjustments/Labs and Tests Ordered: Current medicines are reviewed at length with the patient today.  Concerns regarding medicines are outlined above.   Medication changes, Labs and Tests ordered today are listed in the Patient Instructions below. Patient Instructions  Medication Instructions:  The current medical regimen is effective;  continue present plan and medications.  If you need a refill on your cardiac medications before your next appointment, please call your pharmacy.   Lab work: Fasting labs in a few weeks (CMET, CBC, TSH, LIPID)  If you have labs (blood work) drawn today and your tests are completely normal, you will receive your results only by: Marland Kitchen MyChart Message (if you have MyChart) OR . A paper copy in the mail If you have any lab test that is abnormal or we need to change your treatment, we will call you to review the results.  Testing/Procedures: Echocardiogram (6 months at a [facility] if possible) - Your physician has requested that you have an echocardiogram. Echocardiography is a painless test that uses sound waves to create images of your heart. It  provides your doctor with information about the size and shape of your heart and how well your heart's chambers and valves are working. This procedure takes approximately one hour. There are no restrictions for this procedure. This will be performed at our [facility] location    Follow-Up: At [facility], you and your health needs are our priority.  As part of our continuing mission to provide you with exceptional heart care, we have created designated Provider Care Teams.  These Care Teams include your primary Cardiologist (physician) and Advanced Practice Providers (APPs -  Physician Assistants and Nurse Practitioners) who all work together to provide you with the care you need, when you need it. You will need a follow up appointment in 6 months.  Please call our office 2 months in advance to schedule this appointment.  You may see Dr.Ruqaya Strauss or one of the following Advanced Practice Providers on your designated Care Team: Almyra Deforest, [place] . Fabian Sharp, PA-C        Signed, Shelva Majestic, MD  01/23/2019 2:21 [facility] [address] [place], [place], [place]  [address] Phone: [phone]

## 2019-01-23 ENCOUNTER — Encounter: Payer: Self-pay | Admitting: Cardiovascular Disease

## 2019-02-02 ENCOUNTER — Telehealth: Payer: Self-pay

## 2019-02-02 NOTE — Telephone Encounter (Signed)
New message    Just an FYI. We have made several attempts to contact this patient including sending a letter to schedule or reschedule their echocardiogram. We will be removing the patient from the echo WQ.   Thank you 

## 2019-02-05 NOTE — Telephone Encounter (Signed)
FYI for Isaac Laud to be made aware. He ordered the ECHO back in October and they have not been able to get the patient scheduled.

## 2019-08-12 ENCOUNTER — Telehealth: Payer: Self-pay

## 2019-08-12 NOTE — Telephone Encounter (Signed)
Attempted to contact patient to notify of Virtual visit for his appointment with Dr.Kelly on 08/25. The only number we have on file is not the correct number, patient has no mychart access.

## 2019-08-17 ENCOUNTER — Other Ambulatory Visit: Payer: Self-pay

## 2019-08-17 ENCOUNTER — Ambulatory Visit (INDEPENDENT_AMBULATORY_CARE_PROVIDER_SITE_OTHER): Admitting: Cardiology

## 2019-08-17 ENCOUNTER — Telehealth: Payer: Self-pay | Admitting: Cardiology

## 2019-08-17 ENCOUNTER — Telehealth: Admitting: Cardiovascular Disease

## 2019-08-17 ENCOUNTER — Telehealth: Payer: Self-pay

## 2019-08-17 ENCOUNTER — Encounter: Payer: Self-pay | Admitting: Cardiology

## 2019-08-17 VITALS — BP 142/82 | HR 63 | Temp 97.2°F | Ht 69.0 in | Wt 185.2 lb

## 2019-08-17 DIAGNOSIS — I10 Essential (primary) hypertension: Secondary | ICD-10-CM

## 2019-08-17 DIAGNOSIS — E119 Type 2 diabetes mellitus without complications: Secondary | ICD-10-CM

## 2019-08-17 DIAGNOSIS — E785 Hyperlipidemia, unspecified: Secondary | ICD-10-CM

## 2019-08-17 DIAGNOSIS — Z951 Presence of aortocoronary bypass graft: Secondary | ICD-10-CM

## 2019-08-17 DIAGNOSIS — I2510 Atherosclerotic heart disease of native coronary artery without angina pectoris: Secondary | ICD-10-CM

## 2019-08-17 DIAGNOSIS — I350 Nonrheumatic aortic (valve) stenosis: Secondary | ICD-10-CM | POA: Insufficient documentation

## 2019-08-17 DIAGNOSIS — Z9861 Coronary angioplasty status: Secondary | ICD-10-CM

## 2019-08-17 DIAGNOSIS — D500 Iron deficiency anemia secondary to blood loss (chronic): Secondary | ICD-10-CM

## 2019-08-17 NOTE — Assessment & Plan Note (Signed)
Moderate to severe AS- recent echo done at an outside facility.  He is currently not symptomatic

## 2019-08-17 NOTE — Assessment & Plan Note (Signed)
Symptomatic GI bleeding and anemia on aspirin

## 2019-08-17 NOTE — Progress Notes (Addendum)
Cardiology Office Note:    Date:  08/17/2019   ID:  Miguel Garcia, DOB [birth year], MRN [id number]  PCP:  Patient, No Pcp Per  Cardiologist:  Dr Claiborne Billings Electrophysiologist:  None   Referring MD: No ref. provider found   No chief complaint on file. follow up  History of Present Illness:    Miguel Garcia is a 73 y.o.incarcerated male with a hx of coronary disease, S/P CABG in 2003 followed by LAD and CFX PCI.  Other medical issues include prostate cancer treated with chemotherapy, hypertension, NIDDM, dyslipidemia, diverticular bleeding, and moderate to severe AS.    In April 2019 he underwent catheterization for unstable angina. Cath revealed a patent LIMA-LAD, patent mCFX stent, patent mLAD stent, 100% pLAD, 95% LM, 60% pCFX. The pt underwent PCI with DES covering the LM and proximal pCFX. His EF was 50%.    In August 2019 he had in-stent restenosis treated with PTCA to the LM and CFX.  He had problems with anemia and GI bleeding on aspirin and Brilinta together and eventually the aspirin was stopped.  He has done well on Brilinta alone.    He is here for 59-month follow-up. Echo Aug 2019 showed moderate to severe AS. He had a follow up echocardiogram done at [facility] a week ago, I do not have those results.  He denies any unusual chest pain or shortness of breath.  He is not had syncope.  Past Medical History:  Diagnosis Date  . Anxiety   . Arthritis    "hands, lower back,; L4-5;  neck; C1-2" (03/31/2018)  . 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 Asthma   . CAD (coronary artery disease)    CABG 14 days ago  . CAD S/P percutaneous coronary angioplasty 03/31/2018   LM PCI  . Chronic lower back pain   . Heart murmur   . High cholesterol   . Hypertension   . Myocardial infarction [facility]) 2006; 2007; 2012  . Prostate cancer [facility])    "put 3 beamers in my prostate in 11/2017 & radiation completed in 02/2018" (03/31/2018)  . Type II diabetes mellitus ([place])     Past Surgical  History:  Procedure Laterality Date  . BACK SURGERY    . CORONARY ANGIOPLASTY WITH STENT PLACEMENT  03/31/2018  . CORONARY ARTERY BYPASS GRAFT  2007  . CORONARY BALLOON ANGIOPLASTY N/A 08/17/2018   Procedure: CORONARY BALLOON ANGIOPLASTY;  Surgeon: Troy Sine, MD;  Location: [place] [facility];  Service: Cardiovascular;  Laterality: N/A;  . CORONARY STENT INTERVENTION N/A 03/31/2018   Procedure: CORONARY STENT INTERVENTION;  Surgeon: Troy Sine, MD;  Location: [place] [facility];  Service: Cardiovascular;  Laterality: N/A;  . CORONARY STENT INTERVENTION N/A 08/17/2018   Procedure: CORONARY STENT INTERVENTION;  Surgeon: Troy Sine, MD;  Location: [place] [facility];  Service: Cardiovascular;  Laterality: N/A;  . FOREARM FRACTURE SURGERY Right   . FRACTURE SURGERY    . HEMORRHOID BANDING    . LAPAROSCOPIC CHOLECYSTECTOMY    . LEFT HEART CATH AND CORONARY ANGIOGRAPHY N/A 03/31/2018   Procedure: LEFT HEART CATH AND CORONARY ANGIOGRAPHY;  Surgeon: Troy Sine, MD;  Location: [place] CV LAB;  Service: Cardiovascular;  Laterality: N/A;  . LEFT HEART CATH AND CORS/GRAFTS ANGIOGRAPHY N/A 08/17/2018   Procedure: LEFT HEART CATH AND CORS/GRAFTS ANGIOGRAPHY;  Surgeon: Troy Sine, MD;  Location: [place] CV LAB;  Service: Cardiovascular;  Laterality: N/A;  . ORIF SHOULDER FRACTURE  Left   . POSTERIOR FUSION LUMBAR SPINE     L4-5  . PROSTATE BIOPSY     "put in 3 [employer]ponder Implants in 11/2017"    Current Medications: Current Meds  Medication Sig  . albuterol (PROVENTIL HFA;VENTOLIN HFA) 108 (90 Base) MCG/ACT inhaler Inhale 2 puffs into the lungs 4 (four) times daily as needed for wheezing or shortness of breath.  Marland Kitchen atorvastatin (LIPITOR) 80 MG tablet Take 1 tablet (80 mg total) by mouth daily at 6 PM.  . carvedilol (COREG) 6.25 MG tablet Take 1 tablet by mouth 2 (two) times daily.  . chlorpheniramine (CHLOR-TRIMETON) 4 MG tablet Take 4 mg by mouth 4 (four) times daily  as needed for allergies.  Marland Kitchen docusate sodium (COLACE) 100 MG capsule Take 100 mg by mouth 2 (two) times daily.  . ferrous gluconate (FERGON) 324 MG tablet Take 324 mg by mouth daily with breakfast.  . gabapentin (NEURONTIN) 600 MG tablet Take 600 mg by mouth 3 (three) times daily.   Marland Kitchen glipiZIDE (GLUCOTROL XL) 5 MG 24 hr tablet Take 5 mg by mouth 2 (two) times daily.   Marland Kitchen guaiFENesin (MUCINEX) 600 MG 12 hr tablet Take 600 mg by mouth 2 (two) times daily.  Marland Kitchen HYDROCORTISONE, TOPICAL, 2.5 % SOLN Apply topically.  . isosorbide mononitrate (IMDUR) 60 MG 24 hr tablet Take 1 tablet (60 mg total) by mouth daily.  . nitroGLYCERIN (NITROSTAT) 0.4 MG SL tablet Place 1 tablet (0.4 mg total) under the tongue every 5 (five) minutes as needed for chest pain.  . polyethylene glycol (MIRALAX / GLYCOLAX) packet Take 17 g by mouth daily. Mix 17 grams powder in 4-8 oz of liquid until completely dissolved and drink once daily  . pramoxine-zinc (TRONOLANE) 1-5 % rectal cream Place 1 application rectally 2 (two) times daily.   . simethicone (MYLICON) 80 MG chewable tablet Chew 1 tablet by mouth 4 (four) times daily.  . tamsulosin (FLOMAX) 0.4 MG CAPS capsule Take one capsule in the morning and one capsule in the evening.  . ticagrelor (BRILINTA) 90 MG TABS tablet Take 1 tablet (90 mg total) by mouth 2 (two) times daily.  . [DISCONTINUED] gemfibrozil (LOPID) 600 MG tablet Take 600 mg by mouth 2 (two) times daily.      Allergies:   Lisinopril   Social History   Socioeconomic History  . Marital status: Divorced    Spouse name: Not on file  . Number of children: Not on file  . Years of education: Not on file  . Highest education level: Not on file  Occupational History  . Not on file  Social Needs  . Financial resource strain: Not on file  . Food insecurity    Worry: Not on file    Inability: Not on file  . Transportation needs    Medical: Not on file    Non-medical: Not on file  Tobacco Use  . Smoking  status: Never Smoker  . Smokeless tobacco: Former Systems developer    Types: Chew  Substance and Sexual Activity  . Alcohol use: Not Currently  . Drug use: Not Currently  . Sexual activity: Not Currently  Lifestyle  . Physical activity    Days per week: Not on file    Minutes per session: Not on file  . Stress: Not on file  Relationships  . Social Herbalist on phone: Not on file    Gets together: Not on file    Attends religious service: Not  on file    Active member of club or organization: Not on file    Attends meetings of clubs or organizations: Not on file    Relationship status: Not on file  Other Topics Concern  . Not on file  Social History Narrative  . Not on file     Family History: The patient's family history includes Hypertension in his mother.  ROS:   Please see the history of present illness.     All other systems reviewed and are negative.  EKGs/Labs/Other Studies Reviewed:    The following studies were reviewed today: Echo August 2019  EKG:  EKG is ordered today.  The ekg ordered today demonstrates NSR, 63, LVH with lateral TWI  Recent Labs: 08/19/2018: ALT 15; BUN 23; Creatinine, Ser 1.14; Hemoglobin 10.8; Platelets 144; Potassium 3.4; Sodium 139  Recent Lipid Panel    Component Value Date/Time   CHOL 119 08/15/2018 0632   TRIG 80 08/15/2018 0632   HDL 34 (L) 08/15/2018 0632   CHOLHDL 3.5 08/15/2018 0632   VLDL 16 08/15/2018 0632   LDLCALC 69 08/15/2018 0632    Physical Exam:    VS:  BP (!) 142/82   Pulse 63   Temp (!) 97.2 F (36.2 C)   Ht 5\' 9"  (1.753 m)   Wt 185 lb 3.2 oz (84 kg)   BMI 27.35 kg/m     Wt Readings from Last 3 Encounters:  08/17/19 185 lb 3.2 oz (84 kg)  01/21/19 194 lb (88 kg)  10/15/18 195 lb (88.5 kg)     GEN:  Well nourished, well developed in no acute distress HEENT: Normal, transmitted murmur NECK: No JVD; No carotid bruits LYMPHATICS: No lymphadenopathy CARDIAC: RRR, 2/6 AS murmur with preserved S2 he  also has 2/6 MR murmur RESPIRATORY:  Clear to auscultation without rales, wheezing or rhonchi  ABDOMEN: Soft, non-tender, non-distended MUSCULOSKELETAL:  No edema; No deformity  SKIN: Warm and dry NEUROLOGIC:  Alert and oriented x 3 PSYCHIATRIC:  Normal affect   ASSESSMENT:    CAD S/P percutaneous coronary angioplasty LM and pCFX PCI with DES 03/31/18- ISR Aug - PTCA to LM and p CFX stents  Moderate aortic valve stenosis Moderate to severe AS- recent echo done at an outside facility.  He is currently not symptomatic  Essential hypertension Controlled  HLD- On statin Rx  Non-insulin dependent type 2 diabetes mellitus (HCC) On oral agents  Iron deficiency anemia secondary to blood loss (chronic) Symptomatic GI bleeding and anemia on aspirin  Incarceration .  PLAN:    I will review echo from [facility] and send to Dr Claiborne Billings for further recomendations.  Currently the patient is asymptomatic. F/U 6 months.   Medication Adjustments/Labs and Tests Ordered: Current medicines are reviewed at length with the patient today.  Concerns regarding medicines are outlined above.  No orders of the defined types were placed in this encounter.  No orders of the defined types were placed in this encounter.   Patient Instructions  Medication Instructions:  Your physician recommends that you continue on your current medications as directed. Please refer to the Current Medication list given to you today. If you need a refill on your cardiac medications before your next appointment, please call your pharmacy.   Lab work: None  If you have labs (blood work) drawn today and your tests are completely normal, you will receive your results only by: Marland Kitchen MyChart Message (if you have MyChart) OR . A paper copy in the  mail If you have any lab test that is abnormal or we need to change your treatment, we will call you to review the results.  Testing/Procedures: None   Follow-Up: At [facility], you and your health needs are our priority.  As part of our continuing mission to provide you with exceptional heart care, we have created designated Provider Care Teams.  These Care Teams include your primary Cardiologist (physician) and Advanced Practice Providers (APPs -  Physician Assistants and Nurse Practitioners) who all work together to provide you with the care you need, when you need it. You will need a follow up appointment in 6 months.  Please call our office 2 months in advance to schedule this appointment.  You may see Dr Shelva Majestic or one of the following Advanced Practice Providers on your designated Care Team: [place], [place] . Fabian Sharp, PA-C  Any Other Special Instructions Will Be Listed Below (If Applicable). We will contact [place], [place]  08/17/2019 9:23 AM    [facility]  Addendum:  Echo from [facility] reviewed- patient has normal LVF and moderate AS.  No change in current treatment.  Follow up with Dr Claiborne Billings in 6 months.  We will need to notify the medical department at the prison of these recommendations.   Kerin Ransom PA-C 08/17/2019 3:46 PM

## 2019-08-17 NOTE — Assessment & Plan Note (Signed)
LM and pCFX PCI with DES 03/31/18- ISR Aug - PTCA to LM and p CFX stents

## 2019-08-17 NOTE — Telephone Encounter (Signed)
Requested Echocardiogram from [facility] this morning after patients visit. Received records and Homer reviewed them. He stated the Echo looked fine. Contacted facility to let nurse and patient know that we are not making any changes and for patient to follow up in 6 months. Receptionist took a message and stated she will give the message to the nurse, because the nurse was in the middle of something.

## 2019-08-17 NOTE — Assessment & Plan Note (Signed)
On oral agents 

## 2019-08-17 NOTE — Telephone Encounter (Signed)
Nurse returning your call please send records to fax number [phone] Attn:  Berline Lopes

## 2019-08-17 NOTE — Assessment & Plan Note (Signed)
Controlled.  

## 2019-08-17 NOTE — Patient Instructions (Addendum)
Medication Instructions:  Your physician recommends that you continue on your current medications as directed. Please refer to the Current Medication list given to you today. If you need a refill on your cardiac medications before your next appointment, please call your pharmacy.   Lab work: None  If you have labs (blood work) drawn today and your tests are completely normal, you will receive your results only by: Marland Kitchen MyChart Message (if you have MyChart) OR . A paper copy in the mail If you have any lab test that is abnormal or we need to change your treatment, we will call you to review the results.  Testing/Procedures: None   Follow-Up: At [facility], you and your health needs are our priority.  As part of our continuing mission to provide you with exceptional heart care, we have created designated Provider Care Teams.  These Care Teams include your primary Cardiologist (physician) and Advanced Practice Providers (APPs -  Physician Assistants and Nurse Practitioners) who all work together to provide you with the care you need, when you need it. You will need a follow up appointment in 6 months.  Please call our office 2 months in advance to schedule this appointment.  You may see Dr Shelva Majestic or one of the following Advanced Practice Providers on your designated Care Team: [place], [place] . Fabian Sharp, PA-C  Any Other Special Instructions Will Be Listed Below (If Applicable). We will contact [facility]

## 2019-08-17 NOTE — Progress Notes (Unsigned)
{Choose 1 Note Type (Telehealth Visit or Telephone Visit):[phone]}   Date:  08/17/2019   ID:  Miguel Garcia, DOB 03-15-1946, MRN [id number]  {Patient Location:([phone]::"Home"} {Provider Location:[phone]::"Home"}  PCP:  Patient, No Pcp Per  Cardiologist:  No primary care provider on file. *** Electrophysiologist:  None   Evaluation Performed:  {Choose Visit Type:[phone]::"Follow-Up Visit"}  Chief Complaint:  ***  History of Present Illness:    Miguel Garcia is a 73 y.o. male with ***Miguel Garcia underwent initial CABG revascularization surgery approximately 15 years ago and underwent subsequent stenting to his distal circumflex.  He has been residing at [facility].  2019 he developed symptom complex worrisome for unstable angina.  He was transferred to [facility] where I performed cardiac catheterization which revealed total LAD occlusion with a patent LIMA graft supplying the LAD territory.  He had 95% distal left main calcified stenosis with high-grade proximal stenosis and underwent successful stenting to his left main into the circumflex vessel.  A previously placed mid circumflex stent was patent.  He had done well until August 2019 Velp recurrent chest pain and was noted to have guaiac positive stools.  Repeat catheterization showed 80% in-stent restenosis in the distal left main stent with old total ostial occlusion of the LAD.  He had a patent proximal left circumflex stent with 15% intimal hyperplasia in the mid circumflex stent followed by eccentric 90% stenosis distal to the mid circumflex stent and 85% ostial stenosis in an OM1 vessel which was jailed by stent overlap of the proximal to mid previously placed circumflex stents.  His RCA was dominant with 35% smooth stenosis proximally and 30% stenosis in the midsegment.  He had a patent LIMA graft supplying the mid LAD.  He underwent successful multi lesion intervention with Cutting Balloon/PTCA of his  distal left main stent with the 80% stenosis being reduced to less than 15%, successful stenting of the distal circumflex and a 2.0 x 12 mm Resolute stent was inserted postdilated to 2.25 mm with a 90% stenosis reduced to 0%, and PTCA of the ostium of the OM1 vessel with the 80% 5% stenosis being reduced to 50%.  He has been on medical therapy for his concomitant CAD.  Going to his evaluations he has been seen by [name], NP in the office.  Of note, while he was in the hospital in August an echo Doppler study showed an EF of 60 to 65% with grade 2 diastolic dysfunction.  He had at least moderate aortic stenosis with a mean gradient of 21 and peak gradient of 35.  Aortic valve area was 0.98 cm.  When he was seen by [name] had recommended a subsequent echo which apparently was done by the state and interpreted at [facility] in [place] .  I reviewed this report.  Of note, there was no mention of aortic stenosis.  LV function was normal at 55% there was mild TR, moderate MR and mild aortic insufficiency.  Ells me since his hospitalization he had undergone endoscopy and colonoscopy and his previous mild positive stools were due to her radiation and he did not have any colonic mass or polyps. Presently, Miguel [facility].  He denies recurrent anginal type symptoms.  He presents for evaluation with me in the office today.  The patient {does/does not:200015} have symptoms concerning for COVID-19 infection (fever, chills, cough, or new shortness of breath).    Past Medical History:  Diagnosis Date  .  Anxiety   . Arthritis    "hands, lower back,; L4-5;  neck; C1-2" (03/31/2018)  . 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 Asthma   . CAD (coronary artery disease)    CABG 14 days ago  . CAD S/P percutaneous coronary angioplasty 03/31/2018   LM PCI  . Chronic lower back pain   . Heart murmur   . High cholesterol   . Hypertension   .  Myocardial infarction [facility]) 2006; 2007; 2012  . Prostate cancer [facility])    "put 3 beamers in my prostate in 11/2017 & radiation completed in 02/2018" (03/31/2018)  . Type II diabetes mellitus ([place])    Past Surgical History:  Procedure Laterality Date  . BACK SURGERY    . CORONARY ANGIOPLASTY WITH STENT PLACEMENT  03/31/2018  . CORONARY ARTERY BYPASS GRAFT  2007  . CORONARY BALLOON ANGIOPLASTY N/A 08/17/2018   Procedure: CORONARY BALLOON ANGIOPLASTY;  Surgeon: Troy Sine, MD;  Location: [place] CV [facility];  Service: Cardiovascular;  Laterality: N/A;  . CORONARY STENT INTERVENTION N/A 03/31/2018   Procedure: CORONARY STENT INTERVENTION;  Surgeon: Troy Sine, MD;  Location: [place] CV LAB;  Service: Cardiovascular;  Laterality: N/A;  . CORONARY STENT INTERVENTION N/A 08/17/2018   Procedure: CORONARY STENT INTERVENTION;  Surgeon: Troy Sine, MD;  Location: [place] CV LAB;  Service: Cardiovascular;  Laterality: N/A;  . FOREARM FRACTURE SURGERY Right   . FRACTURE SURGERY    . HEMORRHOID BANDING    . LAPAROSCOPIC CHOLECYSTECTOMY    . LEFT HEART CATH AND CORONARY ANGIOGRAPHY N/A 03/31/2018   Procedure: LEFT HEART CATH AND CORONARY ANGIOGRAPHY;  Surgeon: Troy Sine, MD;  Location: [place] [facility];  Service: Cardiovascular;  Laterality: N/A;  . LEFT HEART CATH AND CORS/GRAFTS ANGIOGRAPHY N/A 08/17/2018   Procedure: LEFT HEART CATH AND CORS/GRAFTS ANGIOGRAPHY;  Surgeon: Troy Sine, MD;  Location: [place] CV LAB;  Service: Cardiovascular;  Laterality: N/A;  . ORIF SHOULDER FRACTURE Left   . POSTERIOR FUSION LUMBAR SPINE     L4-5  . PROSTATE BIOPSY     "put in [employer] Implants in 11/2017"     No outpatient medications have been marked as taking for the 08/17/19 encounter (Appointment) with Troy Sine, MD.     Allergies:   Lisinopril   Social History   Tobacco Use  . Smoking status: Never Smoker  . Smokeless tobacco: Former Systems developer    Types: Chew   Substance Use Topics  . Alcohol use: Not Currently  . Drug use: Not Currently     Family Hx: The patient's family history includes Hypertension in his mother.  ROS:   Please see the history of present illness.    *** All other systems reviewed and are negative.   Prior CV studies:   The following studies were reviewed today:  ***  Labs/Other Tests and Data Reviewed:    EKG:  {EKG/Telemetry Strips Reviewed:[phone]}  Recent Labs: 08/19/2018: ALT 15; BUN 23; Creatinine, Ser 1.14; Hemoglobin 10.8; Platelets 144; Potassium 3.4; Sodium 139   Recent Lipid Panel Lab Results  Component Value Date/Time   CHOL 119 08/15/2018 06:32 AM   TRIG 80 08/15/2018 06:32 AM   HDL 34 (L) 08/15/2018 06:32 AM   CHOLHDL 3.5 08/15/2018 06:32 AM   LDLCALC 69 08/15/2018 06:32 AM    Wt Readings from Last 3 Encounters:  01/21/19 194 lb (88 kg)  10/15/18 195 lb (88.5 kg)  09/15/18  198 lb (89.8 kg)     Objective:    Vital Signs:  There were no vitals taken for this visit.   {HeartCare Virtual Exam (Optional):[phone]::"VITAL SIGNS:  reviewed"}  ASSESSMENT & PLAN:    1. ***  COVID-19 Education: The signs and symptoms of COVID-19 were discussed with the patient and how to seek care for testing (follow up with PCP or arrange E-visit).  ***The importance of social distancing was discussed today.  Time:   Today, I have spent *** minutes with the patient with telehealth technology discussing the above problems.     Medication Adjustments/Labs and Tests Ordered: Current medicines are reviewed at length with the patient today.  Concerns regarding medicines are outlined above.   Tests Ordered: No orders of the defined types were placed in this encounter.   Medication Changes: No orders of the defined types were placed in this encounter.   Follow Up:  {F/U Format:[phone]} {follow up:15908}  Signed, Shelva Majestic, MD  08/17/2019 7:47 AM    [facility]

## 2019-08-17 NOTE — Telephone Encounter (Signed)
I attempted to contact patient, he is in a correctional facility- the number on file is not the correct number; unable to notify of virtual visit, or to prepare for visit this morning.  If patient shows up at office, we will adjust and see if APP could see patient.

## 2019-08-19 ENCOUNTER — Other Ambulatory Visit (INDEPENDENT_AMBULATORY_CARE_PROVIDER_SITE_OTHER)

## 2019-08-19 DIAGNOSIS — I10 Essential (primary) hypertension: Secondary | ICD-10-CM | POA: Diagnosis not present

## 2019-08-19 NOTE — Telephone Encounter (Signed)
Called the [place] and was transfers twice and on hold for ten minutes ill try to call back at a later time.

## 2020-02-18 ENCOUNTER — Ambulatory Visit: Admitting: Cardiovascular Disease

## 2020-03-07 NOTE — Progress Notes (Signed)
Cardiology Office Note   Date:  03/08/2020   ID:  Miguel Garcia, DOB [birth year], MRN [id number]  PCP:  Patient, No Pcp Per  Cardiologist:  Shelva Majestic, MD EP: None  Chief Complaint  Patient presents with  . Follow-up    CAD, AS      History of Present Illness: Miguel Garcia is a 74 y.o. male with a PMH of CAD s/p CABG in 2003 with subsequent PCI/DES to LM-LCx 03/2018 with PTCA for management of ISR of LM stent 07/2018, aortic stenosis, HTN, HLD, DM type 2, prostate cancer s/p chemotherapy, and diverticular bleeding, who presents for routine follow-up.  He was last evaluated by cardiology at an outpatient visit with Kerin Ransom, PA-C 08/17/2019, at which time he had no cardiac complaints. He was reported to have trouble with GI bleeding/anemia with DAPT, so has continued on brilinta alone. He had an echo 07/2019 which showed EF 55-60% with moderate aortic stenosis.   He presents today for routine follow-up. He reports he was recently started on metformin for management of elevated blood sugars in the 400s and has been feeling nightly chest discomfort since that time. It occurs after every dose of 2000mg  of metformin in the evening. He denies any associated diaphoresis, SOB, dizziness, lightheadedness, or syncope. He has not had any exertional chest pain or SOB. He states this is very different from prior angina. Suspect acid reflux may be playing a roll. Given his heart disease history, jardiance should be considered as an alternative, though I do not have any recent BMET to ensure Cr is stable from 2019. In regards to his CAD and AS, do not suspect the chest pain is related. He has no complaints of orthopnea, PND, or LE edema.    Past Medical History:  Diagnosis Date  . Anxiety   . Arthritis    "hands, lower back,; L4-5;  neck; C1-2" (03/31/2018)  . 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 Asthma   . CAD (coronary artery disease)    CABG 14 days ago  . CAD S/P percutaneous coronary  angioplasty 03/31/2018   LM PCI  . Chronic lower back pain   . Heart murmur   . High cholesterol   . Hypertension   . Myocardial infarction [facility]) 2006; 2007; 2012  . Prostate cancer [facility])    "put 3 beamers in my prostate in 11/2017 & radiation completed in 02/2018" (03/31/2018)  . Type II diabetes mellitus ([place])     Past Surgical History:  Procedure Laterality Date  . BACK SURGERY    . CORONARY ANGIOPLASTY WITH STENT PLACEMENT  03/31/2018  . CORONARY ARTERY BYPASS GRAFT  2007  . CORONARY BALLOON ANGIOPLASTY N/A 08/17/2018   Procedure: CORONARY BALLOON ANGIOPLASTY;  Surgeon: Troy Sine, MD;  Location: [place] [facility];  Service: Cardiovascular;  Laterality: N/A;  . CORONARY STENT INTERVENTION N/A 03/31/2018   Procedure: CORONARY STENT INTERVENTION;  Surgeon: Troy Sine, MD;  Location: [place] [facility];  Service: Cardiovascular;  Laterality: N/A;  . CORONARY STENT INTERVENTION N/A 08/17/2018   Procedure: CORONARY STENT INTERVENTION;  Surgeon: Troy Sine, MD;  Location: [place] [facility];  Service: Cardiovascular;  Laterality: N/A;  . FOREARM FRACTURE SURGERY Right   . FRACTURE SURGERY    . HEMORRHOID BANDING    . LAPAROSCOPIC CHOLECYSTECTOMY    . LEFT HEART CATH AND CORONARY ANGIOGRAPHY N/A 03/31/2018   Procedure: LEFT HEART CATH AND CORONARY ANGIOGRAPHY;  Surgeon: Shelva Majestic  A, MD;  Location: [place] [facility];  Service: Cardiovascular;  Laterality: N/A;  . LEFT HEART CATH AND CORS/GRAFTS ANGIOGRAPHY N/A 08/17/2018   Procedure: LEFT HEART CATH AND CORS/GRAFTS ANGIOGRAPHY;  Surgeon: Troy Sine, MD;  Location: [place] [facility];  Service: Cardiovascular;  Laterality: N/A;  . ORIF SHOULDER FRACTURE Left   . POSTERIOR FUSION LUMBAR SPINE     L4-5  . PROSTATE BIOPSY     "put in 3 [employer]ponder Implants in 11/2017"     Current Outpatient Medications  Medication Sig Dispense Refill  . acetaminophen (TYLENOL) 325 MG tablet Take 650 mg by mouth 3 (three) times  daily as needed.    Marland Kitchen albuterol (PROVENTIL HFA;VENTOLIN HFA) 108 (90 Base) MCG/ACT inhaler Inhale 2 puffs into the lungs 4 (four) times daily as needed for wheezing or shortness of breath.    Marland Kitchen atorvastatin (LIPITOR) 80 MG tablet Take 1 tablet (80 mg total) by mouth daily at 6 PM. 30 tablet 0  . carvedilol (COREG) 6.25 MG tablet Take 1 tablet by mouth 2 (two) times daily.    . chlorpheniramine (CHLOR-TRIMETON) 4 MG tablet Take 4 mg by mouth 4 (four) times daily as needed for allergies.    Marland Kitchen docusate sodium (COLACE) 100 MG capsule Take 100 mg by mouth 2 (two) times daily.    . ferrous gluconate (FERGON) 324 MG tablet Take 324 mg by mouth daily with breakfast.    . gabapentin (NEURONTIN) 600 MG tablet Take 600 mg by mouth 3 (three) times daily.     Marland Kitchen glipiZIDE (GLUCOTROL XL) 5 MG 24 hr tablet Take 5 mg by mouth 2 (two) times daily.     Marland Kitchen guaiFENesin (MUCINEX) 600 MG 12 hr tablet Take 600 mg by mouth 2 (two) times daily.    Marland Kitchen HYDROCORTISONE, TOPICAL, 2.5 % SOLN Apply topically.    . isosorbide mononitrate (IMDUR) 60 MG 24 hr tablet Take 1 tablet (60 mg total) by mouth daily. 30 tablet 0  . metFORMIN (GLUMETZA) 500 MG (MOD) 24 hr tablet Take 2,000 mg by mouth every evening.    . nitroGLYCERIN (NITROSTAT) 0.4 MG SL tablet Place 1 tablet (0.4 mg total) under the tongue every 5 (five) minutes as needed for chest pain. 30 tablet 11  . OVER THE COUNTER MEDICATION Place 1 each rectally as needed. Rectal pain    . polyethylene glycol (MIRALAX / GLYCOLAX) packet Take 17 g by mouth daily. Mix 17 grams powder in 4-8 oz of liquid until completely dissolved and drink once daily    . pramoxine-zinc (TRONOLANE) 1-5 % rectal cream Place 1 application rectally 2 (two) times daily.     . simethicone (MYLICON) 80 MG chewable tablet Chew 1 tablet by mouth 4 (four) times daily.    . tamsulosin (FLOMAX) 0.4 MG CAPS capsule Take one capsule in the morning and one capsule in the evening.    . ticagrelor (BRILINTA) 90 MG  TABS tablet Take 1 tablet (90 mg total) by mouth 2 (two) times daily. 60 tablet 0  . Vitamin D, Ergocalciferol, (DRISDOL) 1.25 MG (50000 UNIT) CAPS capsule Take 50,000 Units by mouth every 14 (fourteen) days.    . pantoprazole (PROTONIX) 20 MG tablet Take 1 tablet (20 mg total) by mouth 2 (two) times daily. 180 tablet 3   No current facility-administered medications for this visit.    Allergies:   Lisinopril    Social History:  The patient  reports that he has never smoked. He has  quit using smokeless tobacco.  His smokeless tobacco use included chew. He reports previous alcohol use. He reports previous drug use.   Family History:  The patient's family history includes Hypertension in his mother.    ROS:  Please see the history of present illness.   Otherwise, review of systems are positive for none.   All other systems are reviewed and negative.    PHYSICAL EXAM: VS:  BP 128/60   Pulse 78   Temp (!) 97.3 F (36.3 C) (Temporal)   Resp 15   Ht 5\' 8"  (1.727 m)   Wt 186 lb 12.8 oz (84.7 kg)   SpO2 96%   BMI 28.40 kg/m  , BMI Body mass index is 28.4 kg/m. GEN: Well nourished, well developed, in no acute distress HEENT: normal Neck: no JVD, carotid bruits, or masses Cardiac: RRR; +murmur, no rubs or gallops, 1+ edema  Respiratory:  clear to auscultation bilaterally, normal work of breathing GI: soft, nontender, nondistended, + BS MS: no deformity or atrophy Skin: warm and dry, no rash Neuro:  Strength and sensation are intact Psych: euthymic mood, full affect   EKG:  EKG is not ordered today.   Recent Labs: No results found for requested labs within last 8760 hours.    Lipid Panel    Component Value Date/Time   CHOL 119 08/15/2018 0632   TRIG 80 08/15/2018 0632   HDL 34 (L) 08/15/2018 0632   CHOLHDL 3.5 08/15/2018 0632   VLDL 16 08/15/2018 0632   LDLCALC 69 08/15/2018 0632      Wt Readings from Last 3 Encounters:  03/08/20 186 lb 12.8 oz (84.7 kg)  08/17/19  185 lb 3.2 oz (84 kg)  01/21/19 194 lb (88 kg)      Other studies Reviewed: Additional studies/ records that were reviewed today include:   Left heart catheterization 07/2018:  Ost LAD to Prox LAD lesion is 100% stenosed.  1st Diag lesion is 50% stenosed.  Prox LAD to Mid LAD lesion is 10% stenosed.  Ost 3rd Mrg lesion is 40% stenosed.  Previously placed Ost Cx to Prox Cx stent (unknown type) is widely patent.  Previously placed Prox Cx stent (unknown type) is widely patent.  Prox RCA lesion is 35% stenosed.  Mid RCA lesion is 30% stenosed.  Mid LM to Dist LM lesion is 80% stenosed.  Prox Cx to Mid Cx lesion is 15% stenosed.  A stent was successfully placed.  Post intervention, there is a 0% residual stenosis.  Mid Cx to Dist Cx lesion is 90% stenosed.  Post intervention, there is a 0% residual stenosis.  Ost 2nd Mrg lesion is 85% stenosed.  Post intervention, there is a 10% residual stenosis.  Post intervention, there is a 50% residual stenosis.   Significant of coronary obstructive disease with 80% in-stent restenosis in the distal left main stent.  Old total ostial occlusion of the LAD.  Patent proximal left circumflex stent with 15% intimal hyperplasia in the mid circumflex stent followed by eccentric 90% stenosis distal to the mid circumflex stent and 85% ostial stenosis in the OM1 vessel which is jailed by stent overlap of the proximal and mid  previously placed stents.  Dominant RCA with 35% smooth stenosis proximally and 30% stenosis in the mid segment.  Right and left internal mammary artery supplying the mid LAD with 50% narrowing in the diagonal vessel and patent proximal LAD stent.  Successful multilesion intervention with Cutting Balloon/PTCA of the distal left main stent with  the 80% stenosis being reduced to less than 15%; successful stenting of the distal circumflex with insertion of a 2.0 x 12 mm Resolute DES stent postdilated to 2.25 mm with  a 90% stenosis being reduced to 0%, and PTCA of the ostium of the OM1 vessel eccentric 85% stenosis reduced  to 50%.  RECOMMENDATION: Medical therapy for concomitant CAD.  High potency statin therapy with target LDL less than 70.  Optimal blood pressure control.  Recommend follow-up 2D echo Doppler study to reassess LV function. Recommend dual antiplatelet therapy with ASA 81 mg and Ticagrelor 90mg  twice daily long-term (beyond 12 months) because of multiple stents and left main involvement in this patient status post prior CABG revascularization.    ASSESSMENT AND PLAN:  1. Chest pain: c/w acid reflux due to recently prescribed metformin. He would like to stop this medication, however with blood sugars typically in the 200s, he will likely need an alternative therapy. - If Cr is stable, would recommend starting jardiance in lieu of metformin. This will need to be coordinated at his facility as he would need a follow-up BMET for surveillance. Will send my note with the patient today so recommendations can be passed on to his facility.  - Recommend increasing pantoprazole to 20mg  BID to help with reflux.   2. CAD s/p PCI to LM/OM: no anginal complaints. Not on aspirin given prior GI bleed  - Continue brilinta - Continue statin - Continue imdur and carvedilol  3. Aortic stenosis: moderate on echo 07/2019. No complaints to suggest progression of disease - Plan for repeat echo 07/2020. Rx given to patient to take back to his facility  4. HTN: BP stable  - Continue carvedilol and imdur  5. HLD: no recent lipids on file; goal LDL <70 -  Continue atorvastatin  6. DM type 2: no recent A1C but patient reports BS's have been poorly controlled. Recently started on metformin but not tolerating (see#1) - Continue glipizide - Recommend transition from metformin to jardiance if Cr stable - will defer to facility provider to coordinate.    Current medicines are reviewed at length with the patient  today.  The patient does not have concerns regarding medicines.  The following changes have been made:  As above  Labs/ tests ordered today include:   Orders Placed This Encounter  Procedures  . ECHOCARDIOGRAM COMPLETE     Disposition:   FU with Dr. Claiborne Billings in 6 months  Signed, Abigail Butts, PA-C  03/08/2020 3:48 PM

## 2020-03-08 ENCOUNTER — Other Ambulatory Visit: Payer: Self-pay | Admitting: *Deleted

## 2020-03-08 ENCOUNTER — Encounter: Payer: Self-pay | Admitting: Medical

## 2020-03-08 ENCOUNTER — Other Ambulatory Visit: Payer: Self-pay

## 2020-03-08 ENCOUNTER — Ambulatory Visit (INDEPENDENT_AMBULATORY_CARE_PROVIDER_SITE_OTHER): Admitting: Medical

## 2020-03-08 VITALS — BP 128/60 | HR 78 | Temp 97.3°F | Resp 15 | Ht 68.0 in | Wt 186.8 lb

## 2020-03-08 DIAGNOSIS — I350 Nonrheumatic aortic (valve) stenosis: Secondary | ICD-10-CM | POA: Diagnosis not present

## 2020-03-08 DIAGNOSIS — K219 Gastro-esophageal reflux disease without esophagitis: Secondary | ICD-10-CM

## 2020-03-08 DIAGNOSIS — R079 Chest pain, unspecified: Secondary | ICD-10-CM

## 2020-03-08 DIAGNOSIS — I10 Essential (primary) hypertension: Secondary | ICD-10-CM | POA: Diagnosis not present

## 2020-03-08 DIAGNOSIS — Z951 Presence of aortocoronary bypass graft: Secondary | ICD-10-CM

## 2020-03-08 DIAGNOSIS — I2510 Atherosclerotic heart disease of native coronary artery without angina pectoris: Secondary | ICD-10-CM | POA: Diagnosis not present

## 2020-03-08 DIAGNOSIS — Z9861 Coronary angioplasty status: Secondary | ICD-10-CM

## 2020-03-08 DIAGNOSIS — E785 Hyperlipidemia, unspecified: Secondary | ICD-10-CM

## 2020-03-08 MED ORDER — PANTOPRAZOLE SODIUM 20 MG PO TBEC: 20.0000 mg | DELAYED_RELEASE_TABLET | Freq: Two times a day (BID) | ORAL | 3 refills | Status: AC

## 2020-03-08 NOTE — Patient Instructions (Addendum)
Medication Instructions:  Your physician has recommended you make the following change in your medication:  1.  INCREASE the Protonix to 20 twice a day   *If you need a refill on your cardiac medications before your next appointment, please call your pharmacy*   Lab Work: None ordered  If you have labs (blood work) drawn today and your tests are completely normal, you will receive your results only by: Marland Kitchen MyChart Message (if you have MyChart) OR . A paper copy in the mail If you have any lab test that is abnormal or we need to change your treatment, we will call you to review the results.   Testing/Procedures: Your physician has requested that you have an echocardiogram DUE IN AUGUST 2021. Echocardiography is a painless test that uses sound waves to create images of your heart. It provides your doctor with information about the size and shape of your heart and how well your heart's chambers and valves are working. This procedure takes approximately one hour. There are no restrictions for this procedure.     Follow-Up: At [facility], you and your health needs are our priority.  As part of our continuing mission to provide you with exceptional heart care, we have created designated Provider Care Teams.  These Care Teams include your primary Cardiologist (physician) and Advanced Practice Providers (APPs -  Physician Assistants and Nurse Practitioners) who all work together to provide you with the care you need, when you need it.  We recommend signing up for the patient portal called "MyChart".  Sign up information is provided on this After Visit Summary.  MyChart is used to connect with patients for Virtual Visits (Telemedicine).  Patients are able to view lab/test results, encounter notes, upcoming appointments, etc.  Non-urgent messages can be sent to your provider as well.   To learn more about what you can do with MyChart, go to [email or web address].    Your next appointment:   6  month(s)  The format for your next appointment:   In Person  Provider:   Dr. Shelva Majestic or his care team

## 2020-08-22 NOTE — Progress Notes (Signed)
Cardiology Office Note:    Date:  09/04/2020   ID:  Miguel Garcia, DOB 1946/12/23, MRN [id number]  PCP:  Patient, No Pcp Per  Cardiologist:  Shelva Majestic, MD  Electrophysiologist:  None   Referring MD: No ref. provider found   Chief Complaint: follow-up for CAD and aortic stenosis  History of Present Illness:    Miguel Garcia is a 74 y.o. male with a history of CAD s/p single vessel CABG in 2003 with subsequent PCI to LM-LCX in 03/2018 and PTCA of in-stent restenosis of LM in 07/2018, moderate aortic stenosis on Echo in 07/2019, hypertension, hyperlipidemia, type 2 diabetes mellitus, diverticular bleeding, and prostate cancer s/p chemotherapy who presents today for routine follow-up.  Patient has a history of CAD with remote CABG in 2003. He then had subsequent  PCI with DES to LM-LX in 03/2018 after presenting with unstable angina and being found to have an abnormal stress test. Patient was admitted again in 07/2018 with chest pain. Repeat cardiac catheterization showed 80% in-stent restenosis of LM which was treated with PTCA. Last Echo in 07/2019 showed LVEF of 55-60% with moderate aortic stenosis. At office visit in 07/2019, patient noted trouble with GI bleeding and anemia with DAPT so he was continued on Brilinta alone.   Patient was last seen in the office by [name], PA-C, in 02/2020 at which time he reported recently being started on Metformin and reported nightly chest discomfort after taking evening dose of Metformin. He denied any exertional chest pain or shortness of breath and stated it felt very different from prior angina. Felt to be reflux. Protonix was increased to 20mg  twice daily and it was recommended that Jardiance be started in lieu of Metformin. Repeat Echo was ordered for 07/2020 for routine monitoring of aortic stenosis.   Patient presents today for follow-up. Patient here today with a Medical illustrator from the prison. He is doing well from a cardiac standpoint. No  chest pain, shortness of breath, orthopnea, PND, lower extremity edema. He notes occasional dizziness from his vertigo but no syncope. No palpitations. His only complaint today was some coughing up fresh blood for 5 days a couple of weeks ago. He has not had any hemoptysis in the last 1.5 weeks. No other abnormal bleeding in urine or stools. He already has a follow-up with GI scheduled soon.    Past Medical History:  Diagnosis Date  . Anxiety   . Arthritis    "hands, lower back,; L4-5;  neck; C1-2" (03/31/2018)  . 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 Asthma   . CAD (coronary artery disease)    CABG 14 days ago  . CAD S/P percutaneous coronary angioplasty 03/31/2018   LM PCI  . Chronic lower back pain   . Heart murmur   . High cholesterol   . Hypertension   . Myocardial infarction [facility]) 2006; 2007; 2012  . Prostate cancer [name])    "put 3 beamers in my prostate in 11/2017 & radiation completed in 02/2018" (03/31/2018)  . Type II diabetes mellitus ([place])     Past Surgical History:  Procedure Laterality Date  . BACK SURGERY    . CORONARY ANGIOPLASTY WITH STENT PLACEMENT  03/31/2018  . CORONARY ARTERY BYPASS GRAFT  2007  . CORONARY BALLOON ANGIOPLASTY N/A 08/17/2018   Procedure: CORONARY BALLOON ANGIOPLASTY;  Surgeon: Troy Sine, MD;  Location: [place] [facility];  Service: Cardiovascular;  Laterality: N/A;  . CORONARY STENT INTERVENTION N/A 03/31/2018  Procedure: CORONARY STENT INTERVENTION;  Surgeon: Troy Sine, MD;  Location: [place] [facility];  Service: Cardiovascular;  Laterality: N/A;  . CORONARY STENT INTERVENTION N/A 08/17/2018   Procedure: CORONARY STENT INTERVENTION;  Surgeon: Troy Sine, MD;  Location: [place] [facility];  Service: Cardiovascular;  Laterality: N/A;  . FOREARM FRACTURE SURGERY Right   . FRACTURE SURGERY    . HEMORRHOID BANDING    . LAPAROSCOPIC CHOLECYSTECTOMY    . LEFT HEART CATH AND CORONARY ANGIOGRAPHY N/A 03/31/2018   Procedure: LEFT HEART CATH  AND CORONARY ANGIOGRAPHY;  Surgeon: Troy Sine, MD;  Location: [place] [facility];  Service: Cardiovascular;  Laterality: N/A;  . LEFT HEART CATH AND CORS/GRAFTS ANGIOGRAPHY N/A 08/17/2018   Procedure: LEFT HEART CATH AND CORS/GRAFTS ANGIOGRAPHY;  Surgeon: Troy Sine, MD;  Location: [place] [facility];  Service: Cardiovascular;  Laterality: N/A;  . ORIF SHOULDER FRACTURE Left   . POSTERIOR FUSION LUMBAR SPINE     L4-5  . PROSTATE BIOPSY     "put in 3 [employer] Transponder Implants in 11/2017"    Current Medications: Current Meds  Medication Sig  . acetaminophen (TYLENOL) 325 MG tablet Take 650 mg by mouth 3 (three) times daily as needed.  Marland Kitchen albuterol (PROVENTIL HFA;VENTOLIN HFA) 108 (90 Base) MCG/ACT inhaler Inhale 2 puffs into the lungs 4 (four) times daily as needed for wheezing or shortness of breath.  Marland Kitchen atorvastatin (LIPITOR) 80 MG tablet Take 1 tablet (80 mg total) by mouth daily at 6 PM.  . carvedilol (COREG) 6.25 MG tablet Take 1 tablet by mouth 2 (two) times daily.  . chlorpheniramine (CHLOR-TRIMETON) 4 MG tablet Take 4 mg by mouth 4 (four) times daily as needed for allergies.  Marland Kitchen docusate sodium (COLACE) 100 MG capsule Take 100 mg by mouth 2 (two) times daily.  . ferrous gluconate (FERGON) 324 MG tablet Take 324 mg by mouth daily with breakfast.  . gabapentin (NEURONTIN) 600 MG tablet Take 600 mg by mouth 3 (three) times daily.   Marland Kitchen glipiZIDE (GLUCOTROL XL) 5 MG 24 hr tablet Take 5 mg by mouth 2 (two) times daily.   Marland Kitchen guaiFENesin (MUCINEX) 600 MG 12 hr tablet Take 600 mg by mouth 2 (two) times daily.  Marland Kitchen HYDROCORTISONE, TOPICAL, 2.5 % SOLN Apply topically.  . isosorbide mononitrate (IMDUR) 60 MG 24 hr tablet Take 1 tablet (60 mg total) by mouth daily.  . metFORMIN (GLUMETZA) 500 MG (MOD) 24 hr tablet Take 2,000 mg by mouth every evening.  . nitroGLYCERIN (NITROSTAT) 0.4 MG SL tablet Place 1 tablet (0.4 mg total) under the tongue every 5 (five) minutes as needed for chest pain.    Marland Kitchen OVER THE COUNTER MEDICATION Place 1 each rectally as needed. Rectal pain  . pantoprazole (PROTONIX) 20 MG tablet Take 1 tablet (20 mg total) by mouth 2 (two) times daily.  . pioglitazone (ACTOS) 15 MG tablet Take 15 mg by mouth daily.  . polyethylene glycol (MIRALAX / GLYCOLAX) packet Take 17 g by mouth daily. Mix 17 grams powder in 4-8 oz of liquid until completely dissolved and drink once daily  . pramoxine-zinc (TRONOLANE) 1-5 % rectal cream Place 1 application rectally 2 (two) times daily.   . simethicone (MYLICON) 80 MG chewable tablet Chew 1 tablet by mouth 4 (four) times daily.  . tamsulosin (FLOMAX) 0.4 MG CAPS capsule Take one capsule in the morning and one capsule in the evening.  . ticagrelor (BRILINTA) 90 MG TABS tablet Take 1  tablet (90 mg total) by mouth 2 (two) times daily.  . Vitamin D, Ergocalciferol, (DRISDOL) 1.25 MG (50000 UNIT) CAPS capsule Take 50,000 Units by mouth every 14 (fourteen) days.     Allergies:   Lisinopril   Social History   Socioeconomic History  . Marital status: Divorced    Spouse name: Not on file  . Number of children: Not on file  . Years of education: Not on file  . Highest education level: Not on file  Occupational History  . Not on file  Tobacco Use  . Smoking status: Never Smoker  . Smokeless tobacco: Former Systems developer    Types: Secondary school teacher  . Vaping Use: Never used  Substance and Sexual Activity  . Alcohol use: Not Currently  . Drug use: Not Currently  . Sexual activity: Not Currently  Other Topics Concern  . Not on file  Social History Narrative  . Not on file   Social Determinants of Health   Financial Resource Strain:   . Difficulty of Paying Living Expenses: Not on file  Food Insecurity:   . Worried About Charity fundraiser in the Last Year: Not on file  . Ran Out of Food in the Last Year: Not on file  Transportation Needs:   . Lack of Transportation (Medical): Not on file  . Lack of Transportation (Non-Medical): Not  on file  Physical Activity:   . Days of Exercise per Week: Not on file  . Minutes of Exercise per Session: Not on file  Stress:   . Feeling of Stress : Not on file  Social Connections:   . Frequency of Communication with Friends and Family: Not on file  . Frequency of Social Gatherings with Friends and Family: Not on file  . Attends Religious Services: Not on file  . Active Member of Clubs or Organizations: Not on file  . Attends Archivist Meetings: Not on file  . Marital Status: Not on file     Family History: The patient's family history includes Hypertension in his mother.  ROS:   Please see the history of present illness.     EKGs/Labs/Other Studies Reviewed:    The following studies were reviewed today:  Left Heart Catheterization 08/17/2018:  Ost LAD to Prox LAD lesion is 100% stenosed.  1st Diag lesion is 50% stenosed.  Prox LAD to Mid LAD lesion is 10% stenosed.  Ost 3rd Mrg lesion is 40% stenosed.  Previously placed Ost Cx to Prox Cx stent (unknown type) is widely patent.  Previously placed Prox Cx stent (unknown type) is widely patent.  Prox RCA lesion is 35% stenosed.  Mid RCA lesion is 30% stenosed.  Mid LM to Dist LM lesion is 80% stenosed.  Prox Cx to Mid Cx lesion is 15% stenosed.  A stent was successfully placed.  Post intervention, there is a 0% residual stenosis.  Mid Cx to Dist Cx lesion is 90% stenosed.  Post intervention, there is a 0% residual stenosis.  Ost 2nd Mrg lesion is 85% stenosed.  Post intervention, there is a 10% residual stenosis.  Post intervention, there is a 50% residual stenosis.   Significant of coronary obstructive disease with 80% in-stent restenosis in the distal left main stent.  Old total ostial occlusion of the LAD.  Patent proximal left circumflex stent with 15% intimal hyperplasia in the mid circumflex stent followed by eccentric 90% stenosis distal to the mid circumflex stent and 85% ostial  stenosis in the OM1  vessel which is jailed by stent overlap of the proximal and mid  previously placed stents.  Dominant RCA with 35% smooth stenosis proximally and 30% stenosis in the mid segment.  Right and left internal mammary artery supplying the mid LAD with 50% narrowing in the diagonal vessel and patent proximal LAD stent.  Successful multilesion intervention with Cutting Balloon/PTCA of the distal left main stent with the 80% stenosis being reduced to less than 15%; successful stenting of the distal circumflex with insertion of a 2.0 x 12 mm Resolute DES stent postdilated to 2.25 mm with a 90% stenosis being reduced to 0%, and PTCA of the ostium of the OM1 vessel eccentric 85% stenosis reduced  to 50%.  Recommendations: Medical therapy for concomitant CAD.  High potency statin therapy with target LDL less than 70.  Optimal blood pressure control.  Recommend follow-up 2D echo Doppler study to reassess LV function. Recommend dual antiplatelet therapy with ASA 81 mg and Ticagrelor 90mg  twice daily long-term (beyond 12 months) because of multiple stents and left main involvement in this patient status post prior CABG revascularization.   EKG:  EKG ordered today. EKG personally reviewed and demonstrates normal sinus rhythm, 80 bpm, with inferior Q waves and T wave inversion in lateral leads. Normal axis. Normal PR and QRS interval. QTc 449 ms. No acute changes from prior tracings.  Recent Labs: No results found for requested labs within last 8760 hours.  Recent Lipid Panel    Component Value Date/Time   CHOL 119 08/15/2018 0632   TRIG 80 08/15/2018 0632   HDL 34 (L) 08/15/2018 0632   CHOLHDL 3.5 08/15/2018 0632   VLDL 16 08/15/2018 0632   LDLCALC 69 08/15/2018 0632    Physical Exam:    Vital Signs: BP 120/70   Pulse 80   Ht 5\' 8"  (1.727 m)   Wt 190 lb (86.2 kg)   SpO2 95%   BMI 28.89 kg/m     Wt Readings from Last 3 Encounters:  09/04/20 190 lb (86.2 kg)  03/08/20 186  lb 12.8 oz (84.7 kg)  08/17/19 185 lb 3.2 oz (84 kg)     General: 74 y.o. male in no acute distress. HEENT: Normocephalic and atraumatic. Sclera clear. EOMs intact. Neck: Supple. No carotid bruits. No JVD. Heart: RRR. Distinct S1 and S2. III/VI systolic murmur. No gallops or rubs. Radial pulses 2+ and equal bilaterally. Lungs: No increased work of breathing. Clear to ausculation bilaterally. No wheezes, rhonchi, or rales.  Abdomen: Soft, non-distended, and non-tender to palpation. Bowel sounds present. Extremities: No lower extremity edema.    Skin: Warm and dry. Neuro: Alert and oriented x3. No focal deficits. Psych: Normal affect. Responds appropriately.  Assessment:    1. Coronary artery disease involving native coronary artery of native heart without angina pectoris   2. Moderate aortic valve stenosis   3. Hemoptysis   4. Essential hypertension   5. Hyperlipidemia, unspecified hyperlipidemia type   6. Type 2 diabetes mellitus with complication, without long-term current use of insulin (HCC)   7. Medication management     Plan:    CAD  - History of ingle vessel CABG in 2003 with subsequent PCI to LM-LCX in 03/2018 and PTCA of in-stent restenosis of LM in 07/2018. - No angina.  - Continue Brilinta 90mg  twice daily. No aspirin due to history of GI bleeds.  - Continue Coreg 6.25mg  twice daily, Imdur 60mg  daily, and Lipitor 80mg  daily.  Aortic Stenosis - Moderate on Echo in 07/2019.  -  Patient states he had Echo done recently at [facility]. Will ask this to be faxed to [place].  Hemoptysis - Patient reports 5 days of hemoptysis a couple of weeks ago. Nothing over the last 1.5 weeks. He does have a history of diverticular bleeding. - Patient states he already has a visit with GI scheduled soon.  - Will continue Brilinta for now. - Will check CBC today.  Hypertension - BP well controlled. - Continue Coreg and Imdur.   Hyperlipidemia - Last lipid panel from 07/2018 in our  system: Total Cholesterol 119, Triglycerides 80, HDL 34, LDL 69. - Continue Lipitor 80mg  daily. - Patient states his cholesterol was recently checked at [facility]. Will ask for recent labs to be faxed to [place].  Type 2 Diabetes Mellitus - On Glipizide, Metformin, and Actos at home. - Managed by PCP.  Disposition: Follow up in 6 months with Dr. Claiborne Billings.   Medication Adjustments/Labs and Tests Ordered: Current medicines are reviewed at length with the patient today.  Concerns regarding medicines are outlined above.  Orders Placed This Encounter  Procedures  . CBC  . EKG 12-Lead   No orders of the defined types were placed in this encounter.   Patient Instructions  Medication Instructions:  Continue current medications  *If you need a refill on your cardiac medications before your next appointment, please call your pharmacy*   Lab Work: CBC Today  If you have labs (blood work) drawn today and your tests are completely normal, you will receive your results only by: Marland Kitchen MyChart Message (if you have MyChart) OR . A paper copy in the mail If you have any lab test that is abnormal or we need to change your treatment, we will call you to review the results.   Testing/Procedures: None Ordered   Follow-Up: At [facility], you and your health needs are our priority.  As part of our continuing mission to provide you with exceptional heart care, we have created designated Provider Care Teams.  These Care Teams include your primary Cardiologist (physician) and Advanced Practice Providers (APPs -  Physician Assistants and Nurse Practitioners) who all work together to provide you with the care you need, when you need it.  We recommend signing up for the patient portal called "MyChart".  Sign up information is provided on this After Visit Summary.  MyChart is used to connect with patients for Virtual Visits (Telemedicine).  Patients are able to view lab/test results, encounter notes,  upcoming appointments, etc.  Non-urgent messages can be sent to your provider as well.   To learn more about what you can do with MyChart, go to [email or web address].    Your next appointment:   6 month(s)  The format for your next appointment:   In Person  Provider:   You may see Shelva Majestic, MD or one of the following Advanced Practice Providers on your designated Care Team:    Almyra Deforest, PA-C  Fabian Sharp, [place] or   [name], [place]    Other Instructions  Please faxed copy to Echocardiogram and recent blood work done at facility to [phone]     Signed, Darreld Mclean, PA-C  09/04/2020 9:01 AM    [place]

## 2020-09-04 ENCOUNTER — Encounter: Payer: Self-pay | Admitting: Student

## 2020-09-04 ENCOUNTER — Ambulatory Visit (INDEPENDENT_AMBULATORY_CARE_PROVIDER_SITE_OTHER): Admitting: Student

## 2020-09-04 VITALS — BP 120/70 | HR 80 | Ht 68.0 in | Wt 190.0 lb

## 2020-09-04 DIAGNOSIS — Z79899 Other long term (current) drug therapy: Secondary | ICD-10-CM

## 2020-09-04 DIAGNOSIS — R042 Hemoptysis: Secondary | ICD-10-CM | POA: Diagnosis not present

## 2020-09-04 DIAGNOSIS — I2510 Atherosclerotic heart disease of native coronary artery without angina pectoris: Secondary | ICD-10-CM | POA: Diagnosis not present

## 2020-09-04 DIAGNOSIS — I10 Essential (primary) hypertension: Secondary | ICD-10-CM

## 2020-09-04 DIAGNOSIS — I350 Nonrheumatic aortic (valve) stenosis: Secondary | ICD-10-CM | POA: Diagnosis not present

## 2020-09-04 DIAGNOSIS — E118 Type 2 diabetes mellitus with unspecified complications: Secondary | ICD-10-CM

## 2020-09-04 DIAGNOSIS — E785 Hyperlipidemia, unspecified: Secondary | ICD-10-CM

## 2020-09-04 LAB — CBC
Hematocrit: 45.9 % (ref 37.5–51.0)
Hemoglobin: 15.8 g/dL (ref 13.0–17.7)
MCH: 32 pg (ref 26.6–33.0)
MCHC: 34.4 g/dL (ref 31.5–35.7)
MCV: 93 fL (ref 79–97)
Platelets: 166 10*3/uL (ref 150–450)
RBC: 4.94 x10E6/uL (ref 4.14–5.80)
RDW: 12.6 % (ref 11.6–15.4)
WBC: 6.1 10*3/uL (ref 3.4–10.8)

## 2020-09-04 NOTE — Patient Instructions (Signed)
Medication Instructions:  Continue current medications  *If you need a refill on your cardiac medications before your next appointment, please call your pharmacy*   Lab Work: CBC Today  If you have labs (blood work) drawn today and your tests are completely normal, you will receive your results only by:  [place] (if you have MyChart) OR  A paper copy in the mail If you have any lab test that is abnormal or we need to change your treatment, we will call you to review the results.   Testing/Procedures: None Ordered   Follow-Up: At [facility], you and your health needs are our priority.  As part of our continuing mission to provide you with exceptional heart care, we have created designated Provider Care Teams.  These Care Teams include your primary Cardiologist (physician) and Advanced Practice Providers (APPs -  Physician Assistants and Nurse Practitioners) who all work together to provide you with the care you need, when you need it.  We recommend signing up for the patient portal called "MyChart".  Sign up information is provided on this After Visit Summary.  MyChart is used to connect with patients for Virtual Visits (Telemedicine).  Patients are able to view lab/test results, encounter notes, upcoming appointments, etc.  Non-urgent messages can be sent to your provider as well.   To learn more about what you can do with MyChart, go to [email or web address].    Your next appointment:   6 month(s)  The format for your next appointment:   In Person  Provider:   You may see Shelva Majestic, MD or one of the following Advanced Practice Providers on your designated Care Team:    Almyra Deforest, PA-C  Fabian Sharp, [place] or   [name], [place]    Other Instructions  Please faxed copy to Echocardiogram and recent blood work done at facility to (F) [phone]

## 2020-10-12 ENCOUNTER — Telehealth: Payer: Self-pay | Admitting: Cardiovascular Disease

## 2020-10-12 NOTE — Telephone Encounter (Signed)
   [facility] Pre-operative Risk Assessment    Request for surgical clearance:  1. What type of surgery is being performed? colonoscopy   2. When is this surgery scheduled? TBD   3. What type of clearance is required (medical clearance vs. Pharmacy clearance to hold med vs. Both)? Both   4. Are there any medications that need to be held prior to surgery and how long? Brilinta - 3 days   5. Practice name and name of physician performing surgery? [facility]   6. What is the office phone number? [phone]   7.   What is the office fax number? [phone]  8.   Anesthesia type (None, local, MAC, general) ? Not specified    Fidel Levy 10/12/2020, 2:41 PM  _________________________________________________________________   (provider comments below)

## 2020-10-12 NOTE — Telephone Encounter (Signed)
Dr. Claiborne Billings, This patient has a history of CAD s/p CABG x 1 2003, PCI to LM-LCx 03/2018 and PTCA of in-stent restenosis of LM 07/2018. He is still on brilinta 90 mg BID. Can he hold this for colonoscopy? Also, when he restarts, should he be transitioned to 60 mg BID?

## 2020-10-16 NOTE — Telephone Encounter (Signed)
2nd request faxed over 10/25.

## 2020-10-17 NOTE — Telephone Encounter (Signed)
Hi Dr. Claiborne Billings,  Can you please comment on how long patient can hold Brilinta for upcoming colonoscopy? Please see Angie's note below for more information.  Please route response back to [facility] PREOP.  Thank you! Aaryav Hopfensperger

## 2020-10-23 NOTE — Telephone Encounter (Signed)
Brilinta should be held for at least 5 days prior;  can transition to 60 mg bid rather than 90mg  when ok to resume

## 2020-10-23 NOTE — Telephone Encounter (Signed)
Lanette Hampshire from [employer] is calling to see if the patient can hold his brilinta. She states his colonoscopy is scheduled for 11/03 and she had him hold him today. Please advise.   Fax number [phone]

## 2020-10-23 NOTE — Telephone Encounter (Signed)
I called the patient and speak to Miguel Garcia of [facility] [phone]), I appears the colonoscopy was ordered by oncology service due to concerning features seen on PET scan. However 3 day hold may not be long enough. I will speak with the patient first around 8:30AM tomorrow (scheduled with Medical Nurse Nyoka Cowden) and call [facility] to coordinate the care.

## 2020-10-24 NOTE — Telephone Encounter (Signed)
   Primary Cardiologist: Shelva Majestic, MD  Chart reviewed as part of pre-operative protocol coverage. Patient was contacted 10/24/2020 in reference to pre-operative risk assessment for pending surgery as outlined below.  Miguel Garcia was last seen on 09/04/2020 by [name] PA-C.  Since that day, Warner Laduca has done well without chest pain or shortness of breath.  Therefore, based on ACC/AHA guidelines, the patient would be at acceptable risk for the planned procedure without further cardiovascular testing.   The patient was advised that if he develops new symptoms prior to surgery to contact our office to arrange for a follow-up visit, and he verbalized understanding.  I will route this recommendation to the requesting party via Epic fax function and remove from pre-op pool. Please call with questions.  I spoke with [facility] Endoscopy department, the 3 day holding time for Brilinta is based off on their facility guideline and cleared by their performing physician. [facility] endoscopy service is aware that we typically hold Brilinta for 5 days prior to such procedure due to bleeding risk.   [place], [place] 10/24/2020, 9:44 AM

## 2021-03-19 NOTE — Progress Notes (Signed)
Cardiology Office Note   Date:  03/21/2021   ID:  Miguel Garcia, DOB 1946-04-16, MRN [id number]  PCP:  Patient, No Pcp Per (Inactive)  Cardiologist:  Shelva Majestic, MD EP: None  Chief Complaint  Patient presents with  . Follow-up    CAD, AS      History of Present Illness: Miguel Garcia is a 75 y.o. male with a PMH of CAD s/p single vessel CABG in 2003 with subsequent PCI to LM-LCX in 03/2018 and PTCA of in-stent restenosis of LM in 07/2018, moderate aortic stenosis on Echo in 07/2019, hypertension, hyperlipidemia, type 2 diabetes mellitus, diverticular bleeding, and recurrent prostate cancer currently on hormone therapy,  who presents today for routine follow-up.  Patient has a history of CAD with remote CABG in 2003. He then had subsequent  PCI with DES to LM-LX in 03/2018 after presenting with unstable angina and being found to have an abnormal stress test. Patient was admitted again in 07/2018 with chest pain. Repeat cardiac catheterization showed 80% in-stent restenosis of LM which was treated with PTCA. Last Echo in 09/2020 showed LVEF of 55-60%, G1DD, mild concentric LVH, severe aortic stenosis (previously moderate 07/2019), moderate AI, and mild TR/MR . At office visit in 07/2019, patient noted trouble with GI bleeding and anemia with DAPT so he was continued on Brilinta alone.   He was last evaluated by cardiology at an outpatient visit with [name], PA-C 08/2020 at which time he had no anginal complaints but did have recent hemoptysis 1.5 weeks prior though no melena, hematochezia, or hematemesis. CBC obtained at that visit for close monitoring of hemoglobin, otherwise no medication changes occurred at that visit and he was recommended to follow-up in 6 months.   Since his last visit he was found to have recurrent prostate cancer on PET scan. He was initially recommended to start relugolix hormone therapy given history of CAD and reduced risk for CV events compared to  leuprolide, however coverage was denied and he was subsequently started on Xtandi hormone therapy and leuprolide injections. He had complaints of hematochezia at that time and underwent a colonoscopy 10/2020 which showed multiple colonic angioectasias with bleeding which were managed with APC. It appears his brilinta was transitioned to plavix and atorvastatin to rosuvastatin due to potential drug interactions with his hormone therapy. 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 He reports doing okay from a cardiology perspective since his last visit. He has noticed increased fatigue since starting his treatment for prostate cancer. He is still trying to stay active, walking 1/2-3/4 miles per day, though notices weakness in the legs limits him. He has chronic DOE with strenuous activity which he attributes to his asthma and is unchanged in recent months. He has occasional dizziness 2/2 his vertigo which is managed with meclizine. He has no complaints of chest pain or syncope. He denies orthopnea, PND, or changes in his mild LE edema. Only other complaints today are joint pains for which he was advised to avoid NSAIDS.    Past Medical History:  Diagnosis Date  . Anxiety   . Arthritis    "hands, lower back,; L4-5;  neck; C1-2" (03/31/2018)  . 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 Asthma   . CAD (coronary artery disease)    CABG 14 days ago  . CAD S/P percutaneous coronary angioplasty 03/31/2018   LM PCI  . Chronic lower back pain   . Heart murmur   . High cholesterol   .  Hypertension   . Myocardial infarction [facility]) 2006; 2007; 2012  . Prostate cancer [facility])    "put 3 beamers in my prostate in 11/2017 & radiation completed in 02/2018" (03/31/2018)  . Type II diabetes mellitus ([place])     Past Surgical History:  Procedure Laterality Date  . BACK SURGERY    . CORONARY ANGIOPLASTY WITH STENT PLACEMENT  03/31/2018  . CORONARY ARTERY BYPASS GRAFT  2007  . CORONARY BALLOON ANGIOPLASTY N/A  08/17/2018   Procedure: CORONARY BALLOON ANGIOPLASTY;  Surgeon: Troy Sine, MD;  Location: [place] [facility];  Service: Cardiovascular;  Laterality: N/A;  . CORONARY STENT INTERVENTION N/A 03/31/2018   Procedure: CORONARY STENT INTERVENTION;  Surgeon: Troy Sine, MD;  Location: [place] [facility];  Service: Cardiovascular;  Laterality: N/A;  . CORONARY STENT INTERVENTION N/A 08/17/2018   Procedure: CORONARY STENT INTERVENTION;  Surgeon: Troy Sine, MD;  Location: [place] [facility];  Service: Cardiovascular;  Laterality: N/A;  . FOREARM FRACTURE SURGERY Right   . FRACTURE SURGERY    . HEMORRHOID BANDING    . LAPAROSCOPIC CHOLECYSTECTOMY    . LEFT HEART CATH AND CORONARY ANGIOGRAPHY N/A 03/31/2018   Procedure: LEFT HEART CATH AND CORONARY ANGIOGRAPHY;  Surgeon: Troy Sine, MD;  Location: [place] [facility];  Service: Cardiovascular;  Laterality: N/A;  . LEFT HEART CATH AND CORS/GRAFTS ANGIOGRAPHY N/A 08/17/2018   Procedure: LEFT HEART CATH AND CORS/GRAFTS ANGIOGRAPHY;  Surgeon: Troy Sine, MD;  Location: [place] [facility];  Service: Cardiovascular;  Laterality: N/A;  . ORIF SHOULDER FRACTURE Left   . POSTERIOR FUSION LUMBAR SPINE     L4-5  . PROSTATE BIOPSY     "put in 3 [employer] Transponder Implants in 11/2017"     Current Outpatient Medications  Medication Sig Dispense Refill  . acetaminophen (TYLENOL) 325 MG tablet Take 650 mg by mouth 3 (three) times daily as needed.    Marland Kitchen albuterol (PROVENTIL HFA;VENTOLIN HFA) 108 (90 Base) MCG/ACT inhaler Inhale 2 puffs into the lungs 4 (four) times daily as needed for wheezing or shortness of breath.    . carvedilol (COREG) 6.25 MG tablet Take 1 tablet by mouth 2 (two) times daily.    . chlorpheniramine (CHLOR-TRIMETON) 4 MG tablet Take 4 mg by mouth 4 (four) times daily as needed for allergies.    Marland Kitchen clopidogrel (PLAVIX) 75 MG tablet Take 1 tablet by mouth daily.    Marland Kitchen docusate sodium (COLACE) 100 MG capsule Take 100 mg by mouth 2  (two) times daily.    . DULoxetine (CYMBALTA) 20 MG capsule Take 20 mg by mouth daily.    . enzalutamide (XTANDI) 40 MG capsule Take 160 mg by mouth daily.    . ferrous gluconate (FERGON) 324 MG tablet Take 324 mg by mouth daily with breakfast.    . gabapentin (NEURONTIN) 600 MG tablet Take 600 mg by mouth 3 (three) times daily.     Marland Kitchen glipiZIDE (GLUCOTROL XL) 10 MG 24 hr tablet Take 10 mg by mouth 2 (two) times daily.    Marland Kitchen HYDROCORTISONE, TOPICAL, 2.5 % SOLN Apply topically.    . isosorbide mononitrate (IMDUR) 60 MG 24 hr tablet Take 1 tablet (60 mg total) by mouth daily. 30 tablet 0  . meclizine (ANTIVERT) 25 MG tablet Take 25 mg by mouth 3 (three) times daily as needed for dizziness.    . nitroGLYCERIN (NITROSTAT) 0.4 MG SL tablet Place 1 tablet (0.4 mg total) under the tongue every 5 (  five) minutes as needed for chest pain. 30 tablet 11  . OVER THE COUNTER MEDICATION Place 1 each rectally as needed. Rectal pain    . pantoprazole (PROTONIX) 20 MG tablet Take 1 tablet (20 mg total) by mouth 2 (two) times daily. 180 tablet 3  . pioglitazone (ACTOS) 15 MG tablet Take 15 mg by mouth daily.    . polyethylene glycol (MIRALAX / GLYCOLAX) packet Take 17 g by mouth daily. Mix 17 grams powder in 4-8 oz of liquid until completely dissolved and drink once daily    . pramoxine-zinc (TRONOLANE) 1-5 % rectal cream Place 1 application rectally 2 (two) times daily.    . rosuvastatin (CRESTOR) 20 MG tablet Take 1 tablet by mouth daily.    . tamsulosin (FLOMAX) 0.4 MG CAPS capsule Take one capsule in the morning and one capsule in the evening.    . Vitamin D, Ergocalciferol, (DRISDOL) 1.25 MG (50000 UNIT) CAPS capsule Take 50,000 Units by mouth every 14 (fourteen) days.     No current facility-administered medications for this visit.    Allergies:   Lisinopril    Social History:  The patient  reports that he has never smoked. He has quit using smokeless tobacco.  His smokeless tobacco use included chew. He  reports previous alcohol use. He reports previous drug use.   Family History:  The patient's family history includes Hypertension in his mother.    ROS:  Please see the history of present illness.   Otherwise, review of systems are positive for none.   All other systems are reviewed and negative.    PHYSICAL EXAM: VS:  BP 118/68   Pulse 80   Ht 5\' 8"  (1.727 m)   Wt 197 lb (89.4 kg)   BMI 29.95 kg/m  , BMI Body mass index is 29.95 kg/m. GEN: Well nourished, well developed, in no acute distress HEENT: sclera anciteric Neck: no JVD, carotid bruits, or masses Cardiac: RRR; +murmur, no rubs or gallops, trace-1+ LE edema  Respiratory:  clear to auscultation bilaterally, normal work of breathing GI: soft, nontender, nondistended, + BS MS: no deformity or atrophy Skin: warm and dry, no rash Neuro:  Strength and sensation are intact Psych: euthymic mood, full affect   EKG:  EKG is ordered today. The ekg ordered today demonstrates sinus rhythm, rate 80 bpm, non-specific T wave abnormalities, no STE/D; no change from previous.    Recent Labs: 09/04/2020: Hemoglobin 15.8; Platelets 166    Lipid Panel    Component Value Date/Time   CHOL 119 08/15/2018 0632   TRIG 80 08/15/2018 0632   HDL 34 (L) 08/15/2018 0632   CHOLHDL 3.5 08/15/2018 0632   VLDL 16 08/15/2018 0632   LDLCALC 69 08/15/2018 0632      Wt Readings from Last 3 Encounters:  03/21/21 197 lb (89.4 kg)  09/04/20 190 lb (86.2 kg)  03/08/20 186 lb 12.8 oz (84.7 kg)      Other studies Reviewed: Additional studies/ records that were reviewed today include:   Left Heart Catheterization 08/17/2018:  Ost LAD to Prox LAD lesion is 100% stenosed.  1st Diag lesion is 50% stenosed.  Prox LAD to Mid LAD lesion is 10% stenosed.  Ost 3rd Mrg lesion is 40% stenosed.  Previously placed Ost Cx to Prox Cx stent (unknown type) is widely patent.  Previously placed Prox Cx stent (unknown type) is widely patent.  Prox RCA  lesion is 35% stenosed.  Mid RCA lesion is 30% stenosed.  Mid LM  to Dist LM lesion is 80% stenosed.  Prox Cx to Mid Cx lesion is 15% stenosed.  A stent was successfully placed.  Post intervention, there is a 0% residual stenosis.  Mid Cx to Dist Cx lesion is 90% stenosed.  Post intervention, there is a 0% residual stenosis.  Ost 2nd Mrg lesion is 85% stenosed.  Post intervention, there is a 10% residual stenosis.  Post intervention, there is a 50% residual stenosis.  Significant of coronary obstructive disease with 80% in-stent restenosis in the distal left main stent.  Old total ostial occlusion of the LAD.  Patent proximal left circumflex stent with 15% intimal hyperplasia in the mid circumflex stent followed by eccentric 90% stenosis distal to the mid circumflex stent and 85% ostial stenosis in the OM1 vessel which is jailed by stent overlap of the proximal and mid previously placed stents.  Dominant RCA with 35% smooth stenosis proximally and 30% stenosis in the mid segment.  Right and left internal mammary artery supplying the mid LAD with 50% narrowing in the diagonal vessel and patent proximal LAD stent.  Successful multilesion intervention with Cutting Balloon/PTCA of the distal left main stent with the 80% stenosis being reduced to less than 15%; successful stenting of the distal circumflex with insertion of a 2.0 x 12 mm Resolute DES stent postdilated to 2.25 mm with a 90% stenosis being reduced to 0%, and PTCA of the ostium of the OM1 vessel eccentric 85% stenosis reduced to 50%.  Recommendations: Medical therapy for concomitant CAD. High potency statin therapy with target LDL less than 70. Optimal blood pressure control. Recommend follow-up 2D echo Doppler study to reassess LV function. Recommend dual antiplatelet therapy with ASA 81 mg and Ticagrelor 90mg  twice daily long-term (beyond 12 months) because of multiple stents and left main involvement in this  patient status post prior CABG revascularization.    ASSESSMENT AND PLAN:  1. CAD s/p CABG in 2003 with subsequent PCI/DES to LM/OM in 2019 followed by PTCA to LM later that year: no anginal complaints. Not on aspirin given prior GI bleed. Recently transitioned from brilinta to plavix due to potential drug interaction with hormone therapy for prostate cancer. - Continue plavix - Continue statin - Continue imdur and carvedilol  2. Aortic stenosis: progressed from moderate (76283) to severe on last echo 09/2020. He has no complaints of chest pain, DOE, or syncope - Plan for repeat echo for biannual monitoring given progression of disease.  - Will touch base with structural heart team given progression to severe AS. Doubt he would be a candidate for traditional valve replacement given prior CABG and now metastatic prostate cancer.   3. HTN: BP 118/68 today - Continue carvedilol and imdur  4. HLD: no recent lipids on file; goal LDL <70 -  Continue crestor  5. DM type 2: no recent A1C but patient reports BS's have been poorly controlled. Currently on metformin, actos, and glipizide - Managed by PCP though would consider discontinuation of actos given CV disease and consider starting jardiance/farxiga.   6. Recurrent prostate cancer: noted on PET scan 08/2020. Followed by oncology at [facility] and started on Xtandi and leuprolinde for hormone therapy.  - Continue close follow-up with oncology    Current medicines are reviewed at length with the patient today.  The patient does not have concerns regarding medicines.  The following changes have been made:  As above  Labs/ tests ordered today include:   Orders Placed This Encounter  Procedures  . ECHOCARDIOGRAM  COMPLETE     Disposition:   FU with Dr. Claiborne Billings in 4 months  Signed, Abigail Butts, PA-C  03/21/2021 9:28 AM

## 2021-03-21 ENCOUNTER — Ambulatory Visit (INDEPENDENT_AMBULATORY_CARE_PROVIDER_SITE_OTHER): Admitting: Medical

## 2021-03-21 ENCOUNTER — Other Ambulatory Visit: Payer: Self-pay

## 2021-03-21 ENCOUNTER — Encounter: Payer: Self-pay | Admitting: Medical

## 2021-03-21 VITALS — BP 118/68 | HR 80 | Ht 68.0 in | Wt 197.0 lb

## 2021-03-21 DIAGNOSIS — I2510 Atherosclerotic heart disease of native coronary artery without angina pectoris: Secondary | ICD-10-CM | POA: Diagnosis not present

## 2021-03-21 DIAGNOSIS — E785 Hyperlipidemia, unspecified: Secondary | ICD-10-CM

## 2021-03-21 DIAGNOSIS — E118 Type 2 diabetes mellitus with unspecified complications: Secondary | ICD-10-CM

## 2021-03-21 DIAGNOSIS — I350 Nonrheumatic aortic (valve) stenosis: Secondary | ICD-10-CM | POA: Diagnosis not present

## 2021-03-21 DIAGNOSIS — I10 Essential (primary) hypertension: Secondary | ICD-10-CM | POA: Diagnosis not present

## 2021-03-21 DIAGNOSIS — C61 Malignant neoplasm of prostate: Secondary | ICD-10-CM

## 2021-03-21 NOTE — Patient Instructions (Signed)
Medication Instructions:  Continue current medication  *If you need a refill on your cardiac medications before your next appointment, please call your pharmacy*   Lab Work: None Ordered   Testing/Procedures: Your physician has requested that you have an echocardiogram. Echocardiography is a painless test that uses sound waves to create images of your heart. It provides your doctor with information about the size and shape of your heart and how well your heart's chambers and valves are working. This procedure takes approximately one hour. There are no restrictions for this procedure.   Follow-Up: At [facility], you and your health needs are our priority.  As part of our continuing mission to provide you with exceptional heart care, we have created designated Provider Care Teams.  These Care Teams include your primary Cardiologist (physician) and Advanced Practice Providers (APPs -  Physician Assistants and Nurse Practitioners) who all work together to provide you with the care you need, when you need it.  We recommend signing up for the patient portal called "MyChart".  Sign up information is provided on this After Visit Summary.  MyChart is used to connect with patients for Virtual Visits (Telemedicine).  Patients are able to view lab/test results, encounter notes, upcoming appointments, etc.  Non-urgent messages can be sent to your provider as well.   To learn more about what you can do with MyChart, go to [email or web address].    Your next appointment:   4 month(s)  The format for your next appointment:   In Person  Provider:   You may see Shelva Majestic, MD or one of the following Advanced Practice Providers on your designated Care Team:    Almyra Deforest, PA-C  Fabian Sharp, PA-C or   [name], [place]

## 2021-03-26 NOTE — Addendum Note (Signed)
Addended by: Vennie Homans on: 03/26/2021 03:17 PM   Modules accepted: Orders

## 2021-04-18 NOTE — Addendum Note (Signed)
Addended by: [name] on: 04/18/2021 04:00 PM   Modules accepted: Orders

## 2021-04-20 ENCOUNTER — Ambulatory Visit (HOSPITAL_COMMUNITY): Attending: Cardiovascular Disease

## 2021-04-20 ENCOUNTER — Other Ambulatory Visit: Payer: Self-pay

## 2021-04-20 DIAGNOSIS — I350 Nonrheumatic aortic (valve) stenosis: Secondary | ICD-10-CM | POA: Diagnosis present

## 2021-04-20 LAB — ECHOCARDIOGRAM COMPLETE
AR max vel: 0.76 cm2
AV Area VTI: 0.92 cm2
AV Area mean vel: 0.78 cm2
AV Mean grad: 26 mmHg
AV Peak grad: 44.6 mmHg
Ao pk vel: 3.34 m/s
Area-P 1/2: 3.89 cm2
P 1/2 time: 514 msec
S' Lateral: 3 cm

## 2021-07-25 ENCOUNTER — Ambulatory Visit (INDEPENDENT_AMBULATORY_CARE_PROVIDER_SITE_OTHER): Admitting: Cardiovascular Disease

## 2021-07-25 ENCOUNTER — Other Ambulatory Visit: Payer: Self-pay

## 2021-07-25 DIAGNOSIS — I739 Peripheral vascular disease, unspecified: Secondary | ICD-10-CM | POA: Diagnosis not present

## 2021-07-25 DIAGNOSIS — Z01818 Encounter for other preprocedural examination: Secondary | ICD-10-CM

## 2021-07-25 DIAGNOSIS — I2510 Atherosclerotic heart disease of native coronary artery without angina pectoris: Secondary | ICD-10-CM | POA: Diagnosis not present

## 2021-07-25 DIAGNOSIS — Z9861 Coronary angioplasty status: Secondary | ICD-10-CM

## 2021-07-25 DIAGNOSIS — Z01812 Encounter for preprocedural laboratory examination: Secondary | ICD-10-CM

## 2021-07-25 DIAGNOSIS — R600 Localized edema: Secondary | ICD-10-CM

## 2021-07-25 DIAGNOSIS — Z951 Presence of aortocoronary bypass graft: Secondary | ICD-10-CM

## 2021-07-25 DIAGNOSIS — I340 Nonrheumatic mitral (valve) insufficiency: Secondary | ICD-10-CM | POA: Diagnosis not present

## 2021-07-25 DIAGNOSIS — I350 Nonrheumatic aortic (valve) stenosis: Secondary | ICD-10-CM

## 2021-07-25 DIAGNOSIS — C61 Malignant neoplasm of prostate: Secondary | ICD-10-CM

## 2021-07-25 DIAGNOSIS — E118 Type 2 diabetes mellitus with unspecified complications: Secondary | ICD-10-CM

## 2021-07-25 MED ORDER — FUROSEMIDE 20 MG PO TABS: 20.0000 mg | ORAL_TABLET | Freq: Every day | ORAL | 3 refills | Status: DC

## 2021-07-25 NOTE — H&P (View-Only) (Signed)
Cardiology Office Note    Date:  07/31/2021   ID:  Miguel Garcia, DOB [birth year], MRN [id number]  PCP:  Patient, No Pcp Per (Inactive)  Cardiologist:  Miguel Majestic, MD   No chief complaint on file.  F/u office visit following hospitalization in April and August 2019  History of Present Illness:  Miguel Garcia is a 75 y.o. male who is incarcerated who I saw initially in the office on January 21, 2019 in follow-up of his previous cardiac catheterizations in April and August 2019.  He presents for 1/2-year follow-up evaluation.  Miguel Garcia underwent initial CABG revascularization surgery approximately 15 years ago and underwent subsequent stenting to his distal circumflex.  He has been residing at [facility].  2019 he developed symptom complex worrisome for unstable angina.  He was transferred to [facility] where I performed cardiac catheterization which revealed total LAD occlusion with a patent LIMA graft supplying the LAD territory.  He had 95% distal left main calcified stenosis with high-grade proximal stenosis and underwent successful stenting to his left main into the circumflex vessel.  A previously placed mid circumflex stent was patent.  He had done well until August 2019 Velp recurrent chest pain and was noted to have guaiac positive stools.  Repeat catheterization showed 80% in-stent restenosis in the distal left main stent with old total ostial occlusion of the LAD.  He had a patent proximal left circumflex stent with 15% intimal hyperplasia in the mid circumflex stent followed by eccentric 90% stenosis distal to the mid circumflex stent and 85% ostial stenosis in an OM1 vessel which was jailed by stent overlap of the proximal to mid previously placed circumflex stents.  His RCA was dominant with 35% smooth stenosis proximally and 30% stenosis in the midsegment.  He had a patent LIMA graft supplying the mid LAD.  He underwent successful multi lesion intervention  with Cutting Balloon/PTCA of his distal left main stent with the 80% stenosis being reduced to less than 15%, successful stenting of the distal circumflex and a 2.0 x 12 mm Resolute stent was inserted postdilated to 2.25 mm with a 90% stenosis reduced to 0%, and PTCA of the ostium of the OM1 vessel with the 80% 5% stenosis being reduced to 50%.  He has been on medical therapy for his concomitant CAD.  Going to his evaluations he has been seen by Miguel Garcia in the office.  Of note, while he was in the hospital in August an echo Doppler study showed an EF of 60 to 65% with grade 2 diastolic dysfunction.  He had at least moderate aortic stenosis with a mean gradient of 21 and peak gradient of 35.  Aortic valve area was 0.98 cm.  When he was seen by Miguel Garcia she had recommended a subsequent echo which apparently was done by the state and interpreted at [facility] in [place] .  I reviewed this report.  Of note, there was no mention of aortic stenosis.  LV function was normal at 55% there was mild TR, moderate MR and mild aortic insufficiency.  Following his hospitalization he had undergone endoscopy and colonoscopy and his previous mild positive stools were due to her radiation and he did not have any colonic mass or polyps.  When I saw him in January 2020 he was still residing at the correction center and denied any recurrent anginal symptoms.  He presented to the office for evaluation Miguel Garcia has been residing at [facility]  center.  He denied  recurrent anginal type symptoms.  During his initial office evaluation with me I reviewed his echo Doppler study and suggested he undergo a follow-up echo Doppler in at least 6 months for reevaluation of his aortic stenosis.  Since I saw him, he underwent an evaluation in September 2021 with Miguel Rives, PA-C at which time he had no anginal complaints but had experienced recent hemoptysis.  CBC was obtained at that visit for  close monitoring.    He subsequently was found to have recurrent prostate cancer on PET scanning and although he was initially recommended to start hormonal therapy, coverage was denied and he subsequently was started on Xtandi hormone therapy and leuprolide injections..  He was seen by Miguel Lofts, PA-C on March 21, 2021 with a Designer, industrial/product.  Since his prior evaluation he had noticed some increased fatigue since starting treatment for his prostate CA.  He will try to stay active and was walking 1/2-3/4 of a mile per day.  He had experienced chronic dyspnea on exertion with strenuous activity she felt was attributed to his asthma and and had not changed in recent months.  He did have occasional vertigo and denied complaints of chest pain or syncope.  He underwent a follow-up echo Doppler study on April 20, 2021 which showed an EF of 55 to 60% with grade 2 diastolic dysfunction.  Was mild left ventricular hypertrophy the basal septal segment.  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  In addition his aortic valve was severely calcified and there was evidence for mild regurgitation with moderate aortic valve stenosis.  The mean gradient was 26 mm with a peak gradient of 44.70mHg.  Aortic valve area by VTI measure was 0.92 cm.  Mr. Miguel Moloneyis here in the office today with his correctional officer for further Cardiologic evaluation.  He admits that his legs get tired with walking.  He does admit to exertional shortness of breath with some leg swelling.  He denies chest tightness, presyncope or syncope.  [facility]  He is on carvedilol 6.25 mg twice a day in addition to isosorbide 60 mg daily and continues to be on Plavix 75 mg daily.  He is on pantoprazole for GERD.  He is diabetic on glipizide and pioglitazone.  He is on Flomax for his urinary issues.  He continues to be on rosuvastatin 20 mg  for hyperlipidemia.  He presents for Cardiologic evaluation.   Past Medical History:  Diagnosis Date   Anxiety    Arthritis    "hands, lower back,; L4-5;  neck; C1-2" (03/31/2018)   [place]    "of [place]"   Asthma    CAD (coronary artery disease)    CABG 14 days ago   CAD S/P percutaneous coronary angioplasty 03/31/2018   LM PCI   Chronic lower back pain    Heart murmur    High cholesterol    Hypertension    [facility]ocardial [facility] 2006; 2007; 2012   Prostate cancer ([facility]    "put 3 beamers in my prostate in 11/2017 & radiation completed in 02/2018" (03/31/2018)   [place] II diabetes mellitus [place]     Past Surgical History:  Procedure Laterality Date   BACK SURGERY     CORONARY ANGIOPLASTY WITH STENT PLACEMENT  03/31/2018   CORONARY ARTERY BYPASS GRAFT  2007   CORONARY BALLOON ANGIOPLASTY N/A 08/17/2018   Procedure:  CORONARY BALLOON ANGIOPLASTY;  Surgeon: Troy Sine, MD;  Location: [place] [facility];  Service: Cardiovascular;  Laterality: N/A;   CORONARY STENT INTERVENTION N/A 03/31/2018   Procedure: CORONARY STENT INTERVENTION;  Surgeon: Troy Sine, MD;  Location: [place] [facility];  Service: Cardiovascular;  Laterality: N/A;   CORONARY STENT INTERVENTION N/A 08/17/2018   Procedure: CORONARY STENT INTERVENTION;  Surgeon: Troy Sine, MD;  Location: [place] [facility];  Service: Cardiovascular;  Laterality: N/A;   FOREARM FRACTURE SURGERY Right    FRACTURE SURGERY     HEMORRHOID BANDING     LAPAROSCOPIC CHOLECYSTECTOMY     LEFT HEART CATH AND CORONARY ANGIOGRAPHY N/A 03/31/2018   Procedure: LEFT HEART CATH AND CORONARY ANGIOGRAPHY;  Surgeon: Troy Sine, MD;  Location: [place] [facility];  Service: Cardiovascular;  Laterality: N/A;   LEFT HEART CATH AND CORS/GRAFTS ANGIOGRAPHY N/A 08/17/2018   Procedure: LEFT HEART CATH AND CORS/GRAFTS ANGIOGRAPHY;  Surgeon: Troy Sine, MD;  Location: [place] [facility];  Service: Cardiovascular;  Laterality: N/A;    ORIF SHOULDER FRACTURE Left    POSTERIOR FUSION LUMBAR SPINE     L4-5   PROSTATE BIOPSY     "put in 3 [employer] Transponder Implants in 11/2017"    Current Medications: Outpatient Medications Prior to Visit  Medication Sig Dispense Refill   acetaminophen (TYLENOL) 325 MG tablet Take 650 mg by mouth 3 (three) times daily as needed.     albuterol (PROVENTIL HFA;VENTOLIN HFA) 108 (90 Base) MCG/ACT inhaler Inhale 2 puffs into the lungs 4 (four) times daily as needed for wheezing or shortness of breath.     carvedilol (COREG) 6.25 MG tablet Take 1 tablet by mouth 2 (two) times daily.     chlorpheniramine (CHLOR-TRIMETON) 4 MG tablet Take 4 mg by mouth 4 (four) times daily as needed for allergies.     clopidogrel (PLAVIX) 75 MG tablet Take 1 tablet by mouth daily.     docusate sodium (COLACE) 100 MG capsule Take 100 mg by mouth 2 (two) times daily.     DULoxetine (CYMBALTA) 20 MG capsule Take 20 mg by mouth daily.     enzalutamide (XTANDI) 40 MG capsule Take 160 mg by mouth daily.     ferrous gluconate (FERGON) 324 MG tablet Take 324 mg by mouth daily with breakfast.     gabapentin (NEURONTIN) 600 MG tablet Take 600 mg by mouth 3 (three) times daily.      glipiZIDE (GLUCOTROL XL) 10 MG 24 hr tablet Take 10 mg by mouth 2 (two) times daily.     HYDROCORTISONE, TOPICAL, 2.5 % SOLN Apply topically.     isosorbide mononitrate (IMDUR) 60 MG 24 hr tablet Take 1 tablet (60 mg total) by mouth daily. 30 tablet 0   meclizine (ANTIVERT) 25 MG tablet Take 25 mg by mouth 3 (three) times daily as needed for dizziness.     nitroGLYCERIN (NITROSTAT) 0.4 MG SL tablet Place 1 tablet (0.4 mg total) under the tongue every 5 (five) minutes as needed for chest pain. 30 tablet 11   OVER THE COUNTER MEDICATION Place 1 each rectally as needed. Rectal pain     pantoprazole (PROTONIX) 20 MG tablet Take 1 tablet (20 mg total) by mouth 2 (two) times daily. 180 tablet 3   pioglitazone (ACTOS) 15 MG tablet Take 15 mg by mouth  daily.     polyethylene glycol (MIRALAX / GLYCOLAX) packet Take 17 g by mouth daily. Mix 17 grams  powder in 4-8 oz of liquid until completely dissolved and drink once daily     pramoxine-zinc (TRONOLANE) 1-5 % rectal cream Place 1 application rectally 2 (two) times daily.     rosuvastatin (CRESTOR) 20 MG tablet Take 1 tablet by mouth daily.     tamsulosin (FLOMAX) 0.4 MG CAPS capsule Take one capsule in the morning and one capsule in the evening.     Vitamin D, Ergocalciferol, (DRISDOL) 1.25 MG (50000 UNIT) CAPS capsule Take 50,000 Units by mouth every 14 (fourteen) days.     No facility-administered medications prior to visit.     Allergies:   Lisinopril   Social History   Socioeconomic History   Marital status: Divorced    Spouse name: Not on file   Number of children: Not on file   Years of education: Not on file   Highest education level: Not on file  Occupational History   Not on file  Tobacco Use   Smoking status: Never   Smokeless tobacco: Former    Types: Nurse, children's Use: Never used  Substance and Sexual Activity   Alcohol use: Not Currently   Drug use: Not Currently   Sexual activity: Not Currently  Other Topics Concern   Not on file  Social History Narrative   Not on file   Social Determinants of Health   Financial Resource Strain: Not on file  Food Insecurity: Not on file  Transportation Needs: Not on file  Physical Activity: Not on file  Stress: Not on file  Social Connections: Not on file   Family History:  The patient's family history includes Hypertension in his mother.   ROS General: Negative; No fevers, chills, or night sweats;  HEENT: Negative; No changes in vision or hearing, sinus congestion, difficulty swallowing Pulmonary: Negative; No cough, wheezing, shortness of breath, hemoptysis Cardiovascular: See HPI GI: Negative; No nausea, vomiting, diarrhea, or abdominal pain GU: Negative; No dysuria, hematuria, or difficulty  voiding Musculoskeletal: Negative; no myalgias, joint pain, or weakness Hematologic/Oncology: Prostate CA Endocrine: Negative; no heat/cold intolerance; no diabetes Neuro: Negative; no changes in balance, headaches Skin: Negative; No rashes or skin lesions Psychiatric: Negative; No behavioral problems, depression Sleep: Negative; No snoring, daytime sleepiness, hypersomnolence, bruxism, restless legs, hypnogognic hallucinations, no cataplexy Other comprehensive 14 point system review is negative.   PHYSICAL EXAM:   VS:  BP 122/78   Pulse 73   Ht _0  (1.727 m)   Wt 195 lb 9.6 oz (88.7 kg)   SpO2 99%   BMI 29.74 kg/m     Repeat blood pressure by me was 146/76  Wt Readings from Last 3 Encounters:  07/25/21 195 lb 9.6 oz (88.7 kg)  03/21/21 197 lb (89.4 kg)  09/04/20 190 lb (86.2 kg)     General: Alert, oriented, no distress.  Skin: normal turgor, no rashes, warm and dry HEENT: Normocephalic, atraumatic. Pupils equal round and reactive to light; sclera anicteric; extraocular muscles intact;  Nose without nasal septal hypertrophy Mouth/Parynx benign; Mallinpatti scale 3 Neck: No JVD, no carotid bruits; normal carotid upstroke Lungs: clear to ausculatation and percussion; no wheezing or rales Chest wall: without tenderness to palpitation Heart: PMI not displaced, RRR, s1 s2 normal, 2/6 harsh systolic murmur, in the aortic area and 3/6 systolic murmur in the mitral apical area; no diastolic murmur, no rubs, gallops, thrills, or heaves Abdomen: soft, nontender; no hepatosplenomehaly, BS+; abdominal aorta nontender and not dilated by palpation. Back: no CVA tenderness Pulses  2+ Musculoskeletal: full range of motion, normal strength, no joint deformities Extremities: no clubbing cyanosis or edema, Homan's sign negative  Neurologic: grossly nonfocal; Cranial nerves grossly wnl Psychologic: Normal mood and affect    Studies/Labs Reviewed:   EKG:  EKG is ordered today.  ECG  (independently read by me): NSR at 73, LAE, nonspecific T wave abnormality; QTc 462 msec    January 2020 ECG (independently read by me): Normal sinus rhythm at 72 bpm.  Inferior Q waves in lead III.  T wave abnormality in leads I and aVL.  Normal intervals.  Recent Labs: BMP Latest Ref Rng & Units 08/19/2018 08/18/2018 08/17/2018  Glucose 70 - 99 mg/dL 122(H) 114(H) 121(H)  BUN 8 - 23 mg/dL _0 Creatinine 0.61 - 1.24 mg/dL 1.14 0.96 1.04  Sodium 135 - 145 mmol/L 139 139 139  Potassium 3.5 - 5.1 mmol/L 3.4(L) 3.8 3.8  Chloride 98 - 111 mmol/L 107 108 107  CO2 22 - 32 mmol/L _1 Calcium 8.9 - 10.3 mg/dL 9.4 9.5 9.5     Hepatic Function Latest Ref Rng & Units 08/19/2018 04/01/2018  Total Protein 6.5 - 8.1 g/dL 6.3(L) 5.6(L)  Albumin 3.5 - 5.0 g/dL 3.5 3.0(L)  AST 15 - 41 U/L 20 30  ALT 0 - 44 U/L 15 28  Alk Phosphatase 38 - 126 U/L 66 62  Total Bilirubin 0.3 - 1.2 mg/dL 0.8 0.5  Bilirubin, Direct 0.1 - 0.5 mg/dL - 0.1    CBC Latest Ref Rng & Units 09/04/2020 08/19/2018 08/18/2018  WBC 3.4 - 10.8 x10E3/uL 6.1 5.0 5.5  Hemoglobin 13.0 - 17.7 g/dL 15.8 10.8(L) 12.9(L)  Hematocrit 37.5 - 51.0 % 45.9 31.3(L) 38.4(L)  Platelets 150 - 450 x10E3/uL 166 144(L) 179   Lab Results  Component Value Date   MCV 93 09/04/2020   MCV 93.2 08/19/2018   MCV 93.9 08/18/2018   No results found for: TSH Lab Results  Component Value Date   HGBA1C 6.1 (H) 08/15/2018     BNP No results found for: BNP  ProBNP No results found for: PROBNP   Lipid Panel     Component Value Date/Time   CHOL 119 08/15/2018 0632   TRIG 80 08/15/2018 0632   HDL 34 (L) 08/15/2018 0632   CHOLHDL 3.5 08/15/2018 0632   VLDL 16 08/15/2018 0632   LDLCALC 69 08/15/2018 2248     RADIOLOGY: No results found.   Additional studies/ records that were reviewed today include:  Reviewed his prior cardiac catheterization reports and interventions, echo Doppler study from [facility] as well as from the state  and subsequent office visit with Miguel Garcia and Francia Greaves, DO.  Office visits from [place], PA-C and Miguel Lofts, PA-C were reviewed.  ECHO: 04/20/2021 IMPRESSIONS   1. Left ventricular ejection fraction, by estimation, is 55 to 60%. The  left ventricle has normal function. The left ventricle demonstrates  regional wall motion abnormalities (see scoring diagram/findings for  description). There is mild left ventricular   hypertrophy of the basal-septal segment. Left ventricular diastolic  parameters are consistent with Grade II diastolic dysfunction  (pseudonormalization). Elevated left atrial pressure. There is moderate  hypokinesis of the left ventricular, basal-mid  inferolateral wall.   2. Right ventricular systolic function is normal. The right ventricular  size is normal. There is normal pulmonary artery systolic pressure.   3. Left atrial size was severely dilated.   4. The mitral valve is myxomatous. Moderate to severe  mitral valve  regurgitation. No evidence of mitral stenosis. There is mild holosystolic  prolapse of the middle scallop of the posterior leaflet of the mitral  valve.   5. The aortic valve is tricuspid. There is severe calcification of the  aortic valve. There is severe thickening of the aortic valve. Aortic valve  regurgitation is mild. Moderate aortic valve stenosis. The aortic valve  area and the dimensionless index  overestimate the severity of aortic stenosis due to erroneous placement of  the LVOT pulsed Doppler sample volume.   6. The inferior vena cava is normal in size with greater than 50%  respiratory variability, suggesting right atrial pressure of 3 mmHg.   Comparison(s): Prior images reviewed side by side. The left ventricular  function is unchanged. The left ventricular wall motion abnormality is  unchanged. Aortic stenosis has worsened. Mitral insufficiency was probably  underestimated on the previous echo,   but has also  probably worsened.   ASSESSMENT:    1. Moderately severe aortic stenosis   2. Nonrheumatic moderately severe mitral valve regurgitation   3. Claudication in peripheral vascular disease ([place])   4. CAD S/P percutaneous coronary angioplasty   5. Hx of CABG   6. Lower extremity edema   7. Type 2 diabetes mellitus with complication, without long-term current use of insulin ([place])   8. Recurrent prostate cancer ([place])   9. Pre-procedure lab exam     PLAN:  Miguel Garcia is a 88 -year-old gentleman who has been incarcerated for approximately 15 years.  He has undergone initial CABG revascularization surgery with subsequent interventions.  I reviewed his catheterization in April  2019 with subsequent intervention as well as his last performed by me in August 2019.  Since his last intervention he has been without anginal symptoms and he underwent successful cutting balloon/PTCA of his distal left main stent as well as insertion of a new distal circumflex stent and PTCA of an OM1 stenosis jailed by his previous proximal to mid stents.  He is currently without anginal symptomatology.  Apparently has a issue in the past with radiation leading to some rare episodes of guaiac positive stools.  When I last saw him he was not having any bleeding off aspirin but continued to be on Brilinta.  He presently is on clopidogrel 75 mg daily instead of Brilinta.  He has noticed some increase in progressive shortness of breath with activity.  His legs tire with walking and cramp suggesting of possible claudication as well.  I reviewed his most recent echo Doppler study which has shown progressive valvular disease with now at least moderately severe aortic valve stenosis with a mean gradient of 26 mmHg and peak gradient of 44.6.  Aortic valve area by VTI measurement was 0.92.  There was evidence for mild aortic regurgitation.  However, he also has progressive mitral regurgitation which is now moderate to severe.  He has  noticed leg swelling for the past 4 to 5 months.  His blood pressure today on recheck by me was mildly increased at 146/76.  Since the patient is incarcerated, he has to get approval for any procedures which at times may take at least a month for the approval process.  Presently, I am recommending he initiate furosemide 20 mg daily.  Helpful for his leg edema and shortness of breath.  I am recommending he undergo lower extremity arterial Doppler studies as well as a transesophageal echocardiographic evaluation.  To allow for time for approval, we will schedule  him for this TEE to be done on September 1 with Dr.Acharya.  He continues to be on rosuvastatin for hyperlipidemia with target LDL less than 70.  He is diabetic on glipizide and pioglitazone.  I will see him in 2 to 3 months for follow-up evaluation or sooner as needed.    Medication Adjustments/Labs and Tests Ordered: Current medicines are reviewed at length with the patient today.  Concerns regarding medicines are outlined above.  Medication changes, Labs and Tests ordered today are listed in the Patient Instructions below. Patient Instructions  Medication Instructions:  BEGIN furosemide (Lasix) 82m (1 tablet) daily, if swelling continues increase to 432m(2 tablets) daily.   *If you need a refill on your cardiac medications before your next appointment, please call your pharmacy*   Lab Work: BMET, CBC within 1 week of your TEE procedure.   If you have labs (blood work) drawn today and your tests are completely normal, you will receive your results only by: MyRockwoodif you have MyChart) OR A paper copy in the mail If you have any lab test that is abnormal or we need to change your treatment, we will call you to review the results.   Testing/Procedures:  Your physician has requested that you have a lower or upper extremity arterial duplex. This test is an ultrasound of the arteries in the legs or arms. It looks at arterial  blood flow in the legs and arms. Allow one hour for Lower and Upper Arterial scans. There are no restrictions or special instructions   You are scheduled for a TEE on September 1 with Dr. AcMargaretann Loveless [facility] [place] [id number](1 hour prior to procedure unless lab work is needed; if lab work is needed arrive 1.5 hours ahead)  DIET: Nothing to eat or drink after midnight except a sip of water with medications.  FYI: For your safety, and to allow usKoreao monitor your vital signs accurately during the surgery/procedure we request that   if you have artificial nails, gel coating, SNS etc. Please have those removed prior to your surgery/procedure. Not having the nail coverings /polish removed may result in cancellation or delay of your surgery/procedure.   You must have a responsible person to drive you home and stay in the waiting area during your procedure. Failure to do so could result in cancellation.  Bring your insurance cards.  *Special Note: Every effort is made to have your procedure done on time. Occasionally there are emergencies that occur at the hospital that may cause delays. Please be patient if a delay does occur.     Follow-Up: [facility] and [facility] needs are our priority.  As part of our continuing mission to provide you with exceptional heart care, we have created designated Provider Care Teams.  These Care Teams include your primary Cardiologist (physician) and Advanced Practice Providers (APPs -  Physician Assistants and Nurse Practitioners) who all work together to provide you with the care you need, when you need it.  We recommend signing up for the patient portal called "MyChart".  Sign up information is provided on this After Visit Summary.  MyChart is used to connect with patients for Virtual Visits (Telemedicine).  Patients are able to view lab/test results,  encounter notes, upcoming appointments, etc.  Non-urgent messages can be sent to your provider as well.   To learn more  about what you can do with MyChart, go to [email or web address].    Your next appointment:   2 -3 month(s)  The format for your next appointment:   In Person  Provider:   Shelva Majestic, MD      Signed, Miguel Majestic, MD  07/31/2021 5:18 PM    [place] [address] [place], [place], [place]  [address] Phone: [phone]

## 2021-07-25 NOTE — Progress Notes (Signed)
Cardiology Office Note    Date:  07/31/2021   ID:  Miguel Garcia, DOB [birth year], MRN [id number]  PCP:  Patient, No Pcp Per (Inactive)  Cardiologist:  Shelva Majestic, MD   No chief complaint on file.  F/u office visit following hospitalization in April and August 2019  History of Present Illness:  Miguel Garcia is a 75 y.o. male who is incarcerated who I saw initially in the office on January 21, 2019 in follow-up of his previous cardiac catheterizations in April and August 2019.  He presents for 1/2-year follow-up evaluation.  Miguel Garcia underwent initial CABG revascularization surgery approximately 15 years ago and underwent subsequent stenting to his distal circumflex.  He has been residing at [facility].  2019 he developed symptom complex worrisome for unstable angina.  He was transferred to [facility] where I performed cardiac catheterization which revealed total LAD occlusion with a patent LIMA graft supplying the LAD territory.  He had 95% distal left main calcified stenosis with high-grade proximal stenosis and underwent successful stenting to his left main into the circumflex vessel.  A previously placed mid circumflex stent was patent.  He had done well until August 2019 Velp recurrent chest pain and was noted to have guaiac positive stools.  Repeat catheterization showed 80% in-stent restenosis in the distal left main stent with old total ostial occlusion of the LAD.  He had a patent proximal left circumflex stent with 15% intimal hyperplasia in the mid circumflex stent followed by eccentric 90% stenosis distal to the mid circumflex stent and 85% ostial stenosis in an OM1 vessel which was jailed by stent overlap of the proximal to mid previously placed circumflex stents.  His RCA was dominant with 35% smooth stenosis proximally and 30% stenosis in the midsegment.  He had a patent LIMA graft supplying the mid LAD.  He underwent successful multi lesion intervention  with Cutting Balloon/PTCA of his distal left main stent with the 80% stenosis being reduced to less than 15%, successful stenting of the distal circumflex and a 2.0 x 12 mm Resolute stent was inserted postdilated to 2.25 mm with a 90% stenosis reduced to 0%, and PTCA of the ostium of the OM1 vessel with the 80% 5% stenosis being reduced to 50%.  He has been on medical therapy for his concomitant CAD.  Going to his evaluations he has been seen by Miguel Sims, NP in the office.  Of note, while he was in the hospital in August an echo Doppler study showed an EF of 60 to 65% with grade 2 diastolic dysfunction.  He had at least moderate aortic stenosis with a mean gradient of 21 and peak gradient of 35.  Aortic valve area was 0.98 cm.  When he was seen by Miguel Garcia she had recommended a subsequent echo which apparently was done by the state and interpreted at [facility] in [place] .  I reviewed this report.  Of note, there was no mention of aortic stenosis.  LV function was normal at 55% there was mild TR, moderate MR and mild aortic insufficiency.  Following his hospitalization he had undergone endoscopy and colonoscopy and his previous mild positive stools were due to her radiation and he did not have any colonic mass or polyps.  When I saw him in January 2020 he was still residing at the correction center and denied any recurrent anginal symptoms.  He presented to the office for evaluation Miguel Garcia has been residing at [facility]  center.  He denied  recurrent anginal type symptoms.  During his initial office evaluation with me I reviewed his echo Doppler study and suggested he undergo a follow-up echo Doppler in at least 6 months for reevaluation of his aortic stenosis.  Since I saw him, he underwent an evaluation in September 2021 with Miguel Rives, PA-C at which time he had no anginal complaints but had experienced recent hemoptysis.  CBC was obtained at that visit for  close monitoring.    He subsequently was found to have recurrent prostate cancer on PET scanning and although he was initially recommended to start hormonal therapy, coverage was denied and he subsequently was started on Xtandi hormone therapy and leuprolide injections..  He was seen by Miguel Lofts, PA-C on March 21, 2021 with a Designer, industrial/product.  Since his prior evaluation he had noticed some increased fatigue since starting treatment for his prostate CA.  He will try to stay active and was walking 1/2-3/4 of a mile per day.  He had experienced chronic dyspnea on exertion with strenuous activity she felt was attributed to his asthma and and had not changed in recent months.  He did have occasional vertigo and denied complaints of chest pain or syncope.  He underwent a follow-up echo Doppler study on April 20, 2021 which showed an EF of 55 to 60% with grade 2 diastolic dysfunction.  Was mild left ventricular hypertrophy the basal septal segment.  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  In addition his aortic valve was severely calcified and there was evidence for mild regurgitation with moderate aortic valve stenosis.  The mean gradient was 26 mm with a peak gradient of 44.70mHg.  Aortic valve area by VTI measure was 0.92 cm.  Mr. Miguel Moloneyis here in the office today with his correctional officer for further Cardiologic evaluation.  He admits that his legs get tired with walking.  He does admit to exertional shortness of breath with some leg swelling.  He denies chest tightness, presyncope or syncope.  [facility]  He is on carvedilol 6.25 mg twice a day in addition to isosorbide 60 mg daily and continues to be on Plavix 75 mg daily.  He is on pantoprazole for GERD.  He is diabetic on glipizide and pioglitazone.  He is on Flomax for his urinary issues.  He continues to be on rosuvastatin 20 mg  for hyperlipidemia.  He presents for Cardiologic evaluation.   Past Medical History:  Diagnosis Date   Anxiety    Arthritis    "hands, lower back,; L4-5;  neck; C1-2" (03/31/2018)   [place]    "of [place]"   Asthma    CAD (coronary artery disease)    CABG 14 days ago   CAD S/P percutaneous coronary angioplasty 03/31/2018   LM PCI   Chronic lower back pain    Heart murmur    High cholesterol    Hypertension    [facility]ocardial [facility] 2006; 2007; 2012   Prostate cancer ([facility]    "put 3 beamers in my prostate in 11/2017 & radiation completed in 02/2018" (03/31/2018)   [place] II diabetes mellitus [place]     Past Surgical History:  Procedure Laterality Date   BACK SURGERY     CORONARY ANGIOPLASTY WITH STENT PLACEMENT  03/31/2018   CORONARY ARTERY BYPASS GRAFT  2007   CORONARY BALLOON ANGIOPLASTY N/A 08/17/2018   Procedure:  CORONARY BALLOON ANGIOPLASTY;  Surgeon: Troy Sine, MD;  Location: [place] [facility];  Service: Cardiovascular;  Laterality: N/A;   CORONARY STENT INTERVENTION N/A 03/31/2018   Procedure: CORONARY STENT INTERVENTION;  Surgeon: Troy Sine, MD;  Location: [place] [facility];  Service: Cardiovascular;  Laterality: N/A;   CORONARY STENT INTERVENTION N/A 08/17/2018   Procedure: CORONARY STENT INTERVENTION;  Surgeon: Troy Sine, MD;  Location: [place] [facility];  Service: Cardiovascular;  Laterality: N/A;   FOREARM FRACTURE SURGERY Right    FRACTURE SURGERY     HEMORRHOID BANDING     LAPAROSCOPIC CHOLECYSTECTOMY     LEFT HEART CATH AND CORONARY ANGIOGRAPHY N/A 03/31/2018   Procedure: LEFT HEART CATH AND CORONARY ANGIOGRAPHY;  Surgeon: Troy Sine, MD;  Location: [place] [facility];  Service: Cardiovascular;  Laterality: N/A;   LEFT HEART CATH AND CORS/GRAFTS ANGIOGRAPHY N/A 08/17/2018   Procedure: LEFT HEART CATH AND CORS/GRAFTS ANGIOGRAPHY;  Surgeon: Troy Sine, MD;  Location: [place] [facility];  Service: Cardiovascular;  Laterality: N/A;    ORIF SHOULDER FRACTURE Left    POSTERIOR FUSION LUMBAR SPINE     L4-5   PROSTATE BIOPSY     "put in 3 [employer] Transponder Implants in 11/2017"    Current Medications: Outpatient Medications Prior to Visit  Medication Sig Dispense Refill   acetaminophen (TYLENOL) 325 MG tablet Take 650 mg by mouth 3 (three) times daily as needed.     albuterol (PROVENTIL HFA;VENTOLIN HFA) 108 (90 Base) MCG/ACT inhaler Inhale 2 puffs into the lungs 4 (four) times daily as needed for wheezing or shortness of breath.     carvedilol (COREG) 6.25 MG tablet Take 1 tablet by mouth 2 (two) times daily.     chlorpheniramine (CHLOR-TRIMETON) 4 MG tablet Take 4 mg by mouth 4 (four) times daily as needed for allergies.     clopidogrel (PLAVIX) 75 MG tablet Take 1 tablet by mouth daily.     docusate sodium (COLACE) 100 MG capsule Take 100 mg by mouth 2 (two) times daily.     DULoxetine (CYMBALTA) 20 MG capsule Take 20 mg by mouth daily.     enzalutamide (XTANDI) 40 MG capsule Take 160 mg by mouth daily.     ferrous gluconate (FERGON) 324 MG tablet Take 324 mg by mouth daily with breakfast.     gabapentin (NEURONTIN) 600 MG tablet Take 600 mg by mouth 3 (three) times daily.      glipiZIDE (GLUCOTROL XL) 10 MG 24 hr tablet Take 10 mg by mouth 2 (two) times daily.     HYDROCORTISONE, TOPICAL, 2.5 % SOLN Apply topically.     isosorbide mononitrate (IMDUR) 60 MG 24 hr tablet Take 1 tablet (60 mg total) by mouth daily. 30 tablet 0   meclizine (ANTIVERT) 25 MG tablet Take 25 mg by mouth 3 (three) times daily as needed for dizziness.     nitroGLYCERIN (NITROSTAT) 0.4 MG SL tablet Place 1 tablet (0.4 mg total) under the tongue every 5 (five) minutes as needed for chest pain. 30 tablet 11   OVER THE COUNTER MEDICATION Place 1 each rectally as needed. Rectal pain     pantoprazole (PROTONIX) 20 MG tablet Take 1 tablet (20 mg total) by mouth 2 (two) times daily. 180 tablet 3   pioglitazone (ACTOS) 15 MG tablet Take 15 mg by mouth  daily.     polyethylene glycol (MIRALAX / GLYCOLAX) packet Take 17 g by mouth daily. Mix 17 grams  powder in 4-8 oz of liquid until completely dissolved and drink once daily     pramoxine-zinc (TRONOLANE) 1-5 % rectal cream Place 1 application rectally 2 (two) times daily.     rosuvastatin (CRESTOR) 20 MG tablet Take 1 tablet by mouth daily.     tamsulosin (FLOMAX) 0.4 MG CAPS capsule Take one capsule in the morning and one capsule in the evening.     Vitamin D, Ergocalciferol, (DRISDOL) 1.25 MG (50000 UNIT) CAPS capsule Take 50,000 Units by mouth every 14 (fourteen) days.     No facility-administered medications prior to visit.     Allergies:   Lisinopril   Social History   Socioeconomic History   Marital status: Divorced    Spouse name: Not on file   Number of children: Not on file   Years of education: Not on file   Highest education level: Not on file  Occupational History   Not on file  Tobacco Use   Smoking status: Never   Smokeless tobacco: Former    Types: Nurse, children's Use: Never used  Substance and Sexual Activity   Alcohol use: Not Currently   Drug use: Not Currently   Sexual activity: Not Currently  Other Topics Concern   Not on file  Social History Narrative   Not on file   Social Determinants of Health   Financial Resource Strain: Not on file  Food Insecurity: Not on file  Transportation Needs: Not on file  Physical Activity: Not on file  Stress: Not on file  Social Connections: Not on file   Family History:  The patient's family history includes Hypertension in his mother.   ROS General: Negative; No fevers, chills, or night sweats;  HEENT: Negative; No changes in vision or hearing, sinus congestion, difficulty swallowing Pulmonary: Negative; No cough, wheezing, shortness of breath, hemoptysis Cardiovascular: See HPI GI: Negative; No nausea, vomiting, diarrhea, or abdominal pain GU: Negative; No dysuria, hematuria, or difficulty  voiding Musculoskeletal: Negative; no myalgias, joint pain, or weakness Hematologic/Oncology: Prostate CA Endocrine: Negative; no heat/cold intolerance; no diabetes Neuro: Negative; no changes in balance, headaches Skin: Negative; No rashes or skin lesions Psychiatric: Negative; No behavioral problems, depression Sleep: Negative; No snoring, daytime sleepiness, hypersomnolence, bruxism, restless legs, hypnogognic hallucinations, no cataplexy Other comprehensive 14 point system review is negative.   PHYSICAL EXAM:   VS:  BP 122/78   Pulse 73   Ht _0  (1.727 m)   Wt 195 lb 9.6 oz (88.7 kg)   SpO2 99%   BMI 29.74 kg/m     Repeat blood pressure by me was 146/76  Wt Readings from Last 3 Encounters:  07/25/21 195 lb 9.6 oz (88.7 kg)  03/21/21 197 lb (89.4 kg)  09/04/20 190 lb (86.2 kg)     General: Alert, oriented, no distress.  Skin: normal turgor, no rashes, warm and dry HEENT: Normocephalic, atraumatic. Pupils equal round and reactive to light; sclera anicteric; extraocular muscles intact;  Nose without nasal septal hypertrophy Mouth/Parynx benign; Mallinpatti scale 3 Neck: No JVD, no carotid bruits; normal carotid upstroke Lungs: clear to ausculatation and percussion; no wheezing or rales Chest wall: without tenderness to palpitation Heart: PMI not displaced, RRR, s1 s2 normal, 2/6 harsh systolic murmur, in the aortic area and 3/6 systolic murmur in the mitral apical area; no diastolic murmur, no rubs, gallops, thrills, or heaves Abdomen: soft, nontender; no hepatosplenomehaly, BS+; abdominal aorta nontender and not dilated by palpation. Back: no CVA tenderness Pulses  2+ Musculoskeletal: full range of motion, normal strength, no joint deformities Extremities: no clubbing cyanosis or edema, Homan's sign negative  Neurologic: grossly nonfocal; Cranial nerves grossly wnl Psychologic: Normal mood and affect    Studies/Labs Reviewed:   EKG:  EKG is ordered today.  ECG  (independently read by me): NSR at 73, LAE, nonspecific T wave abnormality; QTc 462 msec    January 2020 ECG (independently read by me): Normal sinus rhythm at 72 bpm.  Inferior Q waves in lead III.  T wave abnormality in leads I and aVL.  Normal intervals.  Recent Labs: BMP Latest Ref Rng & Units 08/19/2018 08/18/2018 08/17/2018  Glucose 70 - 99 mg/dL 122(H) 114(H) 121(H)  BUN 8 - 23 mg/dL _0 Creatinine 0.61 - 1.24 mg/dL 1.14 0.96 1.04  Sodium 135 - 145 mmol/L 139 139 139  Potassium 3.5 - 5.1 mmol/L 3.4(L) 3.8 3.8  Chloride 98 - 111 mmol/L 107 108 107  CO2 22 - 32 mmol/L _1 Calcium 8.9 - 10.3 mg/dL 9.4 9.5 9.5     Hepatic Function Latest Ref Rng & Units 08/19/2018 04/01/2018  Total Protein 6.5 - 8.1 g/dL 6.3(L) 5.6(L)  Albumin 3.5 - 5.0 g/dL 3.5 3.0(L)  AST 15 - 41 U/L 20 30  ALT 0 - 44 U/L 15 28  Alk Phosphatase 38 - 126 U/L 66 62  Total Bilirubin 0.3 - 1.2 mg/dL 0.8 0.5  Bilirubin, Direct 0.1 - 0.5 mg/dL - 0.1    CBC Latest Ref Rng & Units 09/04/2020 08/19/2018 08/18/2018  WBC 3.4 - 10.8 x10E3/uL 6.1 5.0 5.5  Hemoglobin 13.0 - 17.7 g/dL 15.8 10.8(L) 12.9(L)  Hematocrit 37.5 - 51.0 % 45.9 31.3(L) 38.4(L)  Platelets 150 - 450 x10E3/uL 166 144(L) 179   Lab Results  Component Value Date   MCV 93 09/04/2020   MCV 93.2 08/19/2018   MCV 93.9 08/18/2018   No results found for: TSH Lab Results  Component Value Date   HGBA1C 6.1 (H) 08/15/2018     BNP No results found for: BNP  ProBNP No results found for: PROBNP   Lipid Panel     Component Value Date/Time   CHOL 119 08/15/2018 0632   TRIG 80 08/15/2018 0632   HDL 34 (L) 08/15/2018 0632   CHOLHDL 3.5 08/15/2018 0632   VLDL 16 08/15/2018 0632   LDLCALC 69 08/15/2018 2248     RADIOLOGY: No results found.   Additional studies/ records that were reviewed today include:  Reviewed his prior cardiac catheterization reports and interventions, echo Doppler study from [facility] as well as from the state  and subsequent office visit with Miguel Garcia and Francia Greaves, DO.  Office visits from [place], PA-C and Miguel Lofts, PA-C were reviewed.  ECHO: 04/20/2021 IMPRESSIONS   1. Left ventricular ejection fraction, by estimation, is 55 to 60%. The  left ventricle has normal function. The left ventricle demonstrates  regional wall motion abnormalities (see scoring diagram/findings for  description). There is mild left ventricular   hypertrophy of the basal-septal segment. Left ventricular diastolic  parameters are consistent with Grade II diastolic dysfunction  (pseudonormalization). Elevated left atrial pressure. There is moderate  hypokinesis of the left ventricular, basal-mid  inferolateral wall.   2. Right ventricular systolic function is normal. The right ventricular  size is normal. There is normal pulmonary artery systolic pressure.   3. Left atrial size was severely dilated.   4. The mitral valve is myxomatous. Moderate to severe  mitral valve  regurgitation. No evidence of mitral stenosis. There is mild holosystolic  prolapse of the middle scallop of the posterior leaflet of the mitral  valve.   5. The aortic valve is tricuspid. There is severe calcification of the  aortic valve. There is severe thickening of the aortic valve. Aortic valve  regurgitation is mild. Moderate aortic valve stenosis. The aortic valve  area and the dimensionless index  overestimate the severity of aortic stenosis due to erroneous placement of  the LVOT pulsed Doppler sample volume.   6. The inferior vena cava is normal in size with greater than 50%  respiratory variability, suggesting right atrial pressure of 3 mmHg.   Comparison(s): Prior images reviewed side by side. The left ventricular  function is unchanged. The left ventricular wall motion abnormality is  unchanged. Aortic stenosis has worsened. Mitral insufficiency was probably  underestimated on the previous echo,   but has also  probably worsened.   ASSESSMENT:    1. Moderately severe aortic stenosis   2. Nonrheumatic moderately severe mitral valve regurgitation   3. Claudication in peripheral vascular disease ([place])   4. CAD S/P percutaneous coronary angioplasty   5. Hx of CABG   6. Lower extremity edema   7. Type 2 diabetes mellitus with complication, without long-term current use of insulin ([place])   8. Recurrent prostate cancer ([place])   9. Pre-procedure lab exam     PLAN:  Miguel Garcia is a 88 -year-old gentleman who has been incarcerated for approximately 15 years.  He has undergone initial CABG revascularization surgery with subsequent interventions.  I reviewed his catheterization in April  2019 with subsequent intervention as well as his last performed by me in August 2019.  Since his last intervention he has been without anginal symptoms and he underwent successful cutting balloon/PTCA of his distal left main stent as well as insertion of a new distal circumflex stent and PTCA of an OM1 stenosis jailed by his previous proximal to mid stents.  He is currently without anginal symptomatology.  Apparently has a issue in the past with radiation leading to some rare episodes of guaiac positive stools.  When I last saw him he was not having any bleeding off aspirin but continued to be on Brilinta.  He presently is on clopidogrel 75 mg daily instead of Brilinta.  He has noticed some increase in progressive shortness of breath with activity.  His legs tire with walking and cramp suggesting of possible claudication as well.  I reviewed his most recent echo Doppler study which has shown progressive valvular disease with now at least moderately severe aortic valve stenosis with a mean gradient of 26 mmHg and peak gradient of 44.6.  Aortic valve area by VTI measurement was 0.92.  There was evidence for mild aortic regurgitation.  However, he also has progressive mitral regurgitation which is now moderate to severe.  He has  noticed leg swelling for the past 4 to 5 months.  His blood pressure today on recheck by me was mildly increased at 146/76.  Since the patient is incarcerated, he has to get approval for any procedures which at times may take at least a month for the approval process.  Presently, I am recommending he initiate furosemide 20 mg daily.  Helpful for his leg edema and shortness of breath.  I am recommending he undergo lower extremity arterial Doppler studies as well as a transesophageal echocardiographic evaluation.  To allow for time for approval, we will schedule  him for this TEE to be done on September 1 with Dr.Acharya.  He continues to be on rosuvastatin for hyperlipidemia with target LDL less than 70.  He is diabetic on glipizide and pioglitazone.  I will see him in 2 to 3 months for follow-up evaluation or sooner as needed.    Medication Adjustments/Labs and Tests Ordered: Current medicines are reviewed at length with the patient today.  Concerns regarding medicines are outlined above.  Medication changes, Labs and Tests ordered today are listed in the Patient Instructions below. Patient Instructions  Medication Instructions:  BEGIN furosemide (Lasix) 82m (1 tablet) daily, if swelling continues increase to 432m(2 tablets) daily.   *If you need a refill on your cardiac medications before your next appointment, please call your pharmacy*   Lab Work: BMET, CBC within 1 week of your TEE procedure.   If you have labs (blood work) drawn today and your tests are completely normal, you will receive your results only by: MyRockwoodif you have MyChart) OR A paper copy in the mail If you have any lab test that is abnormal or we need to change your treatment, we will call you to review the results.   Testing/Procedures:  Your physician has requested that you have a lower or upper extremity arterial duplex. This test is an ultrasound of the arteries in the legs or arms. It looks at arterial  blood flow in the legs and arms. Allow one hour for Lower and Upper Arterial scans. There are no restrictions or special instructions   You are scheduled for a TEE on September 1 with Dr. AcMargaretann Loveless [facility] [place] [id number](1 hour prior to procedure unless lab work is needed; if lab work is needed arrive 1.5 hours ahead)  DIET: Nothing to eat or drink after midnight except a sip of water with medications.  FYI: For your safety, and to allow usKoreao monitor your vital signs accurately during the surgery/procedure we request that   if you have artificial nails, gel coating, SNS etc. Please have those removed prior to your surgery/procedure. Not having the nail coverings /polish removed may result in cancellation or delay of your surgery/procedure.   You must have a responsible person to drive you home and stay in the waiting area during your procedure. Failure to do so could result in cancellation.  Bring your insurance cards.  *Special Note: Every effort is made to have your procedure done on time. Occasionally there are emergencies that occur at the hospital that may cause delays. Please be patient if a delay does occur.     Follow-Up: [facility] and [facility] needs are our priority.  As part of our continuing mission to provide you with exceptional heart care, we have created designated Provider Care Teams.  These Care Teams include your primary Cardiologist (physician) and Advanced Practice Providers (APPs -  Physician Assistants and Nurse Practitioners) who all work together to provide you with the care you need, when you need it.  We recommend signing up for the patient portal called "MyChart".  Sign up information is provided on this After Visit Summary.  MyChart is used to connect with patients for Virtual Visits (Telemedicine).  Patients are able to view lab/test results,  encounter notes, upcoming appointments, etc.  Non-urgent messages can be sent to your provider as well.   To learn more  about what you can do with MyChart, go to [email or web address].    Your next appointment:   2 -3 month(s)  The format for your next appointment:   In Person  Provider:   Shelva Majestic, MD      Signed, Shelva Majestic, MD  07/31/2021 5:18 PM    [place] [address] [place], [place], [place]  [address] Phone: [phone]

## 2021-07-25 NOTE — Patient Instructions (Addendum)
Medication Instructions:  BEGIN furosemide (Lasix) '20mg'$  (1 tablet) daily, if swelling continues increase to '40mg'$  (2 tablets) daily.   *If you need a refill on your cardiac medications before your next appointment, please call your pharmacy*   Lab Work: BMET, CBC within 1 week of your TEE procedure.   If you have labs (blood work) drawn today and your tests are completely normal, you will receive your results only by: [place] (if you have MyChart) OR A paper copy in the mail If you have any lab test that is abnormal or we need to change your treatment, we will call you to review the results.   Testing/Procedures:  Your physician has requested that you have a lower or upper extremity arterial duplex. This test is an ultrasound of the arteries in the legs or arms. It looks at arterial blood flow in the legs and arms. Allow one hour for Lower and Upper Arterial scans. There are no restrictions or special instructions   You are scheduled for a TEE on September 1 with Dr. Margaretann Loveless.  Please arrive at the [facility] (Main Entrance A) at [facility]: [address] [place], [place] [address] at [place]. (1 hour prior to procedure unless lab work is needed; if lab work is needed arrive 1.5 hours ahead)  DIET: Nothing to eat or drink after midnight except a sip of water with medications.  FYI: For your safety, and to allow [place] to monitor your vital signs accurately during the surgery/procedure we request that   if you have artificial nails, gel coating, SNS etc. Please have those removed prior to your surgery/procedure. Not having the nail coverings /polish removed may result in cancellation or delay of your surgery/procedure.   You must have a responsible person to drive you home and stay in the waiting area during your procedure. Failure to do so could result in cancellation.  Bring your insurance cards.  *Special Note: Every effort is made to have your procedure done on time.  Occasionally there are emergencies that occur at the hospital that may cause delays. Please be patient if a delay does occur.     Follow-Up: At [facility], you and your health needs are our priority.  As part of our continuing mission to provide you with exceptional heart care, we have created designated Provider Care Teams.  These Care Teams include your primary Cardiologist (physician) and Advanced Practice Providers (APPs -  Physician Assistants and Nurse Practitioners) who all work together to provide you with the care you need, when you need it.  We recommend signing up for the patient portal called "MyChart".  Sign up information is provided on this After Visit Summary.  MyChart is used to connect with patients for Virtual Visits (Telemedicine).  Patients are able to view lab/test results, encounter notes, upcoming appointments, etc.  Non-urgent messages can be sent to your provider as well.   To learn more about what you can do with MyChart, go to [email or web address].    Your next appointment:   2 -3 month(s)  The format for your next appointment:   In Person  Provider:   Shelva Majestic, MD

## 2021-07-31 ENCOUNTER — Encounter: Payer: Self-pay | Admitting: Cardiovascular Disease

## 2021-08-03 ENCOUNTER — Other Ambulatory Visit (HOSPITAL_COMMUNITY): Payer: Self-pay | Admitting: Cardiovascular Disease

## 2021-08-03 DIAGNOSIS — I739 Peripheral vascular disease, unspecified: Secondary | ICD-10-CM

## 2021-08-08 ENCOUNTER — Ambulatory Visit (HOSPITAL_COMMUNITY)
Admission: RE | Admit: 2021-08-08 | Discharge: 2021-08-08 | Disposition: A | Discharge status: Home / Self Care | Source: Ambulatory Visit | Attending: Cardiology | Admitting: Cardiology

## 2021-08-08 ENCOUNTER — Other Ambulatory Visit: Payer: Self-pay

## 2021-08-08 DIAGNOSIS — I739 Peripheral vascular disease, unspecified: Secondary | ICD-10-CM | POA: Insufficient documentation

## 2021-08-22 ENCOUNTER — Telehealth: Payer: Self-pay | Admitting: Cardiovascular Disease

## 2021-08-22 NOTE — Telephone Encounter (Signed)
Attempted to call back. Was connected to the extension but there was no answer nor voicemail available. The patient will need to arrive by 8:30 to have labs done prior since these were not completed.  You are scheduled for a TEE on September 1 with Dr. Margaretann Loveless. Please arrive at the [facility] (Main Entrance A) at [facility]: [address] [place], [place] [address] at [place]. (1 hour prior to procedure unless lab work is needed; if lab work is needed arrive 1.5 hours ahead)  DIET: Nothing to eat or drink after midnight except a sip of water with medications.  FYI: For your safety, and to allow [place] to monitor your vital signs accurately during the surgery/procedure we request that  if you have artificial nails, gel coating, SNS etc. Please have those removed prior to your surgery/procedure. Not having the nail coverings /polish removed may result in cancellation or delay of your surgery/procedure.  You must have a responsible person to drive you home and stay in the waiting area during your procedure. Failure to do so could result in cancellation.

## 2021-08-22 NOTE — Telephone Encounter (Signed)
Patient is scheduled for a TEE with Dr. Margaretann Loveless on 08/23/21. A representative with [employer] called to go over the information regarding this procedure. Specifically, she would like to know the exact location and what time the patient needs to arrive. She states she needs to know ASAP to confirm with transportation.  Phone #: [phone] (ext#: [phone])

## 2021-08-23 ENCOUNTER — Ambulatory Visit (HOSPITAL_COMMUNITY): Admitting: Anesthesiology

## 2021-08-23 ENCOUNTER — Ambulatory Visit (HOSPITAL_COMMUNITY)
Admission: RE | Admit: 2021-08-23 | Discharge: 2021-08-23 | Disposition: A | Discharge status: Home / Self Care | Source: Ambulatory Visit | Attending: Internal Medicine | Admitting: Internal Medicine

## 2021-08-23 ENCOUNTER — Other Ambulatory Visit (HOSPITAL_COMMUNITY)

## 2021-08-23 ENCOUNTER — Encounter (HOSPITAL_COMMUNITY)
Admission: RE | Disposition: A | Discharge status: Home / Self Care | Payer: Self-pay | Source: Ambulatory Visit | Attending: Internal Medicine

## 2021-08-23 ENCOUNTER — Other Ambulatory Visit: Payer: Self-pay

## 2021-08-23 ENCOUNTER — Encounter (HOSPITAL_COMMUNITY): Payer: Self-pay | Admitting: Internal Medicine

## 2021-08-23 ENCOUNTER — Ambulatory Visit (HOSPITAL_BASED_OUTPATIENT_CLINIC_OR_DEPARTMENT_OTHER)
Admission: RE | Admit: 2021-08-23 | Discharge: 2021-08-23 | Disposition: A | Discharge status: Home / Self Care | Source: Ambulatory Visit | Attending: Internal Medicine | Admitting: Internal Medicine

## 2021-08-23 DIAGNOSIS — R600 Localized edema: Secondary | ICD-10-CM | POA: Insufficient documentation

## 2021-08-23 DIAGNOSIS — Z7984 Long term (current) use of oral hypoglycemic drugs: Secondary | ICD-10-CM | POA: Diagnosis not present

## 2021-08-23 DIAGNOSIS — Z888 Allergy status to other drugs, medicaments and biological substances status: Secondary | ICD-10-CM | POA: Diagnosis not present

## 2021-08-23 DIAGNOSIS — I080 Rheumatic disorders of both mitral and aortic valves: Secondary | ICD-10-CM | POA: Insufficient documentation

## 2021-08-23 DIAGNOSIS — I2510 Atherosclerotic heart disease of native coronary artery without angina pectoris: Secondary | ICD-10-CM | POA: Insufficient documentation

## 2021-08-23 DIAGNOSIS — Z87891 Personal history of nicotine dependence: Secondary | ICD-10-CM | POA: Insufficient documentation

## 2021-08-23 DIAGNOSIS — I340 Nonrheumatic mitral (valve) insufficiency: Secondary | ICD-10-CM

## 2021-08-23 DIAGNOSIS — Z951 Presence of aortocoronary bypass graft: Secondary | ICD-10-CM | POA: Diagnosis not present

## 2021-08-23 DIAGNOSIS — Z7902 Long term (current) use of antithrombotics/antiplatelets: Secondary | ICD-10-CM | POA: Diagnosis not present

## 2021-08-23 DIAGNOSIS — I350 Nonrheumatic aortic (valve) stenosis: Secondary | ICD-10-CM | POA: Diagnosis not present

## 2021-08-23 DIAGNOSIS — Z79899 Other long term (current) drug therapy: Secondary | ICD-10-CM | POA: Insufficient documentation

## 2021-08-23 DIAGNOSIS — E1151 Type 2 diabetes mellitus with diabetic peripheral angiopathy without gangrene: Secondary | ICD-10-CM | POA: Diagnosis not present

## 2021-08-23 DIAGNOSIS — I351 Nonrheumatic aortic (valve) insufficiency: Secondary | ICD-10-CM | POA: Diagnosis not present

## 2021-08-23 DIAGNOSIS — Z955 Presence of coronary angioplasty implant and graft: Secondary | ICD-10-CM | POA: Diagnosis not present

## 2021-08-23 DIAGNOSIS — C61 Malignant neoplasm of prostate: Secondary | ICD-10-CM | POA: Insufficient documentation

## 2021-08-23 HISTORY — PX: BUBBLE STUDY: SHX6837

## 2021-08-23 HISTORY — PX: TEE WITHOUT CARDIOVERSION: SHX5443

## 2021-08-23 SURGERY — ECHOCARDIOGRAM, TRANSESOPHAGEAL
Anesthesia: Monitor Anesthesia Care

## 2021-08-23 MED ORDER — PROPOFOL 10 MG/ML IV BOLUS
INTRAVENOUS | Status: DC | PRN
Start: 2021-08-23 — End: 2021-08-23
  Administered 2021-08-23 (×3): 20 mg via INTRAVENOUS

## 2021-08-23 MED ORDER — LACTATED RINGERS IV SOLN: INTRAVENOUS | Status: DC | PRN

## 2021-08-23 MED ORDER — SODIUM CHLORIDE 0.9 % IV SOLN: INTRAVENOUS | Status: DC

## 2021-08-23 MED ORDER — PROPOFOL 500 MG/50ML IV EMUL
INTRAVENOUS | Status: DC | PRN
  Administered 2021-08-23: 100 ug/kg/min via INTRAVENOUS

## 2021-08-23 MED ORDER — BUTAMBEN-TETRACAINE-BENZOCAINE 2-2-14 % EX AERO
INHALATION_SPRAY | CUTANEOUS | Status: DC | PRN
  Administered 2021-08-23: 2 via TOPICAL

## 2021-08-23 NOTE — Anesthesia Preprocedure Evaluation (Addendum)
Anesthesia Evaluation  Patient identified by MRN, date of birth, ID band Patient awake    Reviewed: Allergy & Precautions, NPO status , Patient's Chart, lab work & pertinent test results  Airway Mallampati: II  TM Distance: >3 FB Neck ROM: Full    Dental  (+) Poor Dentition, Missing, Dental Advisory Given   Pulmonary asthma ,  asbestosis   Pulmonary exam normal breath sounds clear to auscultation       Cardiovascular hypertension, + angina + CAD, + Past MI and + Cardiac Stents  Normal cardiovascular exam Rhythm:Regular Rate:Normal     Neuro/Psych Anxiety negative neurological ROS     GI/Hepatic negative GI ROS, Neg liver ROS,   Endo/Other  diabetes, Type 2  Renal/GU      Musculoskeletal  (+) Arthritis , Osteoarthritis,    Abdominal   Peds negative pediatric ROS (+)  Hematology  (+) anemia ,   Anesthesia Other Findings Prostate cancer   Reproductive/Obstetrics                            Anesthesia Physical Anesthesia Plan  ASA: 4  Anesthesia Plan: MAC   Post-op Pain Management:    Induction: Intravenous  PONV Risk Score and Plan: 1 and Treatment may vary due to age or medical condition, Ondansetron, Propofol infusion and TIVA  Airway Management Planned: Natural Airway and Nasal Cannula  Additional Equipment: None  Intra-op Plan:   Post-operative Plan:   Informed Consent: I have reviewed the patients History and Physical, chart, labs and discussed the procedure including the risks, benefits and alternatives for the proposed anesthesia with the patient or authorized representative who has indicated his/her understanding and acceptance.       Plan Discussed with: Anesthesiologist and CRNA  Anesthesia Plan Comments:         Anesthesia Quick Evaluation

## 2021-08-23 NOTE — Interval H&P Note (Signed)
History and Physical Interval Note:  08/23/2021 7:56 AM  Miguel Garcia  has presented today for surgery, with the diagnosis of MITRAL REGURGITATION, AORTIC STENOSIS.  The various methods of treatment have been discussed with the patient and family. After consideration of risks, benefits and other options for treatment, the patient has consented to  Procedure(s): TRANSESOPHAGEAL ECHOCARDIOGRAM (TEE) (N/A) as a surgical intervention.  The patient's history has been reviewed, patient examined, no change in status, stable for surgery.  I have reviewed the patient's chart and labs.  Questions were answered to the patient's satisfaction.     [name]

## 2021-08-23 NOTE — CV Procedure (Signed)
INDICATIONS: AS, MR  PROCEDURE:   Informed consent was obtained prior to the procedure. The risks, benefits and alternatives for the procedure were discussed and the patient comprehended these risks.  Risks include, but are not limited to, cough, sore throat, vomiting, nausea, somnolence, esophageal and stomach trauma or perforation, bleeding, low blood pressure, aspiration, pneumonia, infection, trauma to the teeth and death.    After a procedural time-out, the oropharynx was anesthetized with 20% benzocaine spray.   During this procedure the patient was administered propofol per anesthesia.  The patient's heart rate, blood pressure, and oxygen saturation were monitored continuously during the procedure. The period of conscious sedation was 40 minutes, of which I was present face-to-face 100% of this time.  The transesophageal probe was inserted in the esophagus and stomach without difficulty and multiple views were obtained.  The patient was kept under observation until the patient left the procedure room.  The patient left the procedure room in stable condition.   Agitated microbubble saline contrast was administered.  COMPLICATIONS:    There were no immediate complications.  FINDINGS:   FORMAL ECHOCARDIOGRAM REPORT PENDING - Normal biventricular function. - Severe, eccentric mitral valve regurgitation. There is a flail segment on the posterior mitral leaflet on the P2 scallop with degenerative change/calcification. There is also a small independently mobile lesion on the atrial surface of the mitral valve. 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 Severe and eccentric anteriorly direct jet of mitral valve regurgitation. PV blunting and systolic reversal noted.  - Moderate-severe aortic valve stenosis. The valve is severely calcified. Mean gradient 31 mmHg. AVA by TVI 0.7 cm2, Dimensionless index 0.2. SVI is 26. Mild AR.  - Normal aorta dimensions. - No PFO - No LA  appendage thrombus.   RECOMMENDATIONS:   Dismissal with guard when alert.   Time Spent Directly with the Patient:  60 minutes   Miguel Garcia A Miguel Garcia 08/23/2021, 10:50 AM

## 2021-08-23 NOTE — Progress Notes (Signed)
  Echocardiogram Echocardiogram Transesophageal has been performed.  Miguel Garcia 08/23/2021, 11:03 AM

## 2021-08-23 NOTE — Transfer of Care (Signed)
Immediate Anesthesia Transfer of Care Note  Patient: Miguel Garcia  Procedure(s) Performed: TRANSESOPHAGEAL ECHOCARDIOGRAM (TEE) BUBBLE STUDY  Patient Location: Endoscopy Unit  Anesthesia Type:General  Level of Consciousness: oriented, drowsy and patient cooperative  Airway & Oxygen Therapy: Patient Spontanous Breathing and Patient connected to nasal cannula oxygen  Post-op Assessment: Report given to RN and Post -op Vital signs reviewed and stable  Post vital signs: Reviewed  Last Vitals:  Vitals Value Taken Time  BP 151/78 08/23/21 1102  Temp 36.4 C 08/23/21 1042  Pulse 75 08/23/21 1103  Resp 12 08/23/21 1103  SpO2 98 % 08/23/21 1103  Vitals shown include unvalidated device data.  Last Pain:  Vitals:   08/23/21 1102  TempSrc:   PainSc: 0-No pain         Complications: No notable events documented.

## 2021-08-23 NOTE — Anesthesia Procedure Notes (Signed)
Procedure Name: MAC Date/Time: 08/23/2021 9:50 AM Performed by: Jenne Campus, CRNA Pre-anesthesia Checklist: Patient identified, Emergency Drugs available, Suction available and Patient being monitored Oxygen Delivery Method: Nasal cannula

## 2021-08-23 NOTE — Discharge Instructions (Signed)

## 2021-08-24 ENCOUNTER — Encounter (HOSPITAL_COMMUNITY): Payer: Self-pay | Admitting: Internal Medicine

## 2021-08-24 LAB — ECHO TEE
AR max vel: 0.73 cm2
AV Area VTI: 0.7 cm2
AV Area mean vel: 0.69 cm2
AV Mean grad: 30.5 mmHg
AV Peak grad: 50.4 mmHg
Ao pk vel: 3.55 m/s
MV M vel: 6.21 m/s
MV Peak grad: 154.4 mmHg
Radius: 1.2 cm

## 2021-08-24 NOTE — Anesthesia Postprocedure Evaluation (Signed)
Anesthesia Post Note  Patient: Miguel Garcia  Procedure(s) Performed: TRANSESOPHAGEAL ECHOCARDIOGRAM (TEE) BUBBLE STUDY     Patient location during evaluation: PACU Anesthesia Type: General Level of consciousness: awake Pain management: pain level controlled Vital Signs Assessment: post-procedure vital signs reviewed and stable Respiratory status: spontaneous breathing and respiratory function stable Cardiovascular status: stable Postop Assessment: no apparent nausea or vomiting Anesthetic complications: no   No notable events documented.  Last Vitals:  Vitals:   08/23/21 1052 08/23/21 1102  BP: 135/73 (!) 151/78  Pulse: 71 76  Resp: 13 16  Temp:    SpO2: 98% 97%    Last Pain:  Vitals:   08/23/21 1102  TempSrc:   PainSc: 0-No pain   Pain Goal:                   Merlinda Frederick

## 2021-08-29 NOTE — Telephone Encounter (Signed)
Patient had TEE completed.  Will remove from triage.

## 2021-11-05 ENCOUNTER — Ambulatory Visit (INDEPENDENT_AMBULATORY_CARE_PROVIDER_SITE_OTHER): Admitting: Cardiovascular Disease

## 2021-11-05 ENCOUNTER — Encounter: Payer: Self-pay | Admitting: Cardiovascular Disease

## 2021-11-05 ENCOUNTER — Other Ambulatory Visit: Payer: Self-pay

## 2021-11-05 DIAGNOSIS — I10 Essential (primary) hypertension: Secondary | ICD-10-CM

## 2021-11-05 DIAGNOSIS — E785 Hyperlipidemia, unspecified: Secondary | ICD-10-CM

## 2021-11-05 DIAGNOSIS — Z794 Long term (current) use of insulin: Secondary | ICD-10-CM

## 2021-11-05 DIAGNOSIS — Z9861 Coronary angioplasty status: Secondary | ICD-10-CM

## 2021-11-05 DIAGNOSIS — I350 Nonrheumatic aortic (valve) stenosis: Secondary | ICD-10-CM

## 2021-11-05 DIAGNOSIS — I340 Nonrheumatic mitral (valve) insufficiency: Secondary | ICD-10-CM

## 2021-11-05 DIAGNOSIS — E118 Type 2 diabetes mellitus with unspecified complications: Secondary | ICD-10-CM

## 2021-11-05 DIAGNOSIS — I2510 Atherosclerotic heart disease of native coronary artery without angina pectoris: Secondary | ICD-10-CM | POA: Diagnosis not present

## 2021-11-05 DIAGNOSIS — I951 Orthostatic hypotension: Secondary | ICD-10-CM

## 2021-11-05 DIAGNOSIS — Z951 Presence of aortocoronary bypass graft: Secondary | ICD-10-CM | POA: Diagnosis not present

## 2021-11-05 NOTE — Progress Notes (Signed)
Cardiology Office Note    Date:  11/12/2021   ID:  Miguel Garcia, DOB 1946-10-27, MRN [id number]  PCP:  Patient, No Pcp Per (Inactive)  Cardiologist:  Shelva Majestic, MD   No chief complaint on file.  3 month F/u office visit   History of Present Illness:  Miguel Garcia is a 75 y.o. male who is incarcerated who I saw initially in the office on January 21, 2019 in follow-up of his previous cardiac catheterizations in April and August 2019.  He presents for 3 month follow-up evaluation following his TEE.   Miguel Garcia underwent initial CABG revascularization surgery approximately 15 years ago and underwent subsequent stenting to his distal circumflex.  He has been residing at [facility].  2019 he developed symptom complex worrisome for unstable angina.  He was transferred to [facility] where I performed cardiac catheterization which revealed total LAD occlusion with a patent LIMA graft supplying the LAD territory.  He had 95% distal left main calcified stenosis with high-grade proximal stenosis and underwent successful stenting to his left main into the circumflex vessel.  A previously placed mid circumflex stent was patent.  He had done well until August 2019 Velp recurrent chest pain and was noted to have guaiac positive stools.  Repeat catheterization showed 80% in-stent restenosis in the distal left main stent with old total ostial occlusion of the LAD.  He had a patent proximal left circumflex stent with 15% intimal hyperplasia in the mid circumflex stent followed by eccentric 90% stenosis distal to the mid circumflex stent and 85% ostial stenosis in an OM1 vessel which was jailed by stent overlap of the proximal to mid previously placed circumflex stents.  His RCA was dominant with 35% smooth stenosis proximally and 30% stenosis in the midsegment.  He had a patent LIMA graft supplying the mid LAD.  He underwent successful multi lesion intervention with Cutting  Balloon/PTCA of his distal left main stent with the 80% stenosis being reduced to less than 15%, successful stenting of the distal circumflex and a 2.0 x 12 mm Resolute stent was inserted postdilated to 2.25 mm with a 90% stenosis reduced to 0%, and PTCA of the ostium of the OM1 vessel with the 80% 5% stenosis being reduced to 50%.  He has been on medical therapy for his concomitant CAD.  Going to his evaluations he has been seen by [name], NP in the office.  Of note, while he was in the hospital in August an echo Doppler study showed an EF of 60 to 65% with grade 2 diastolic dysfunction.  He had at least moderate aortic stenosis with a mean gradient of 21 and peak gradient of 35.  Aortic valve area was 0.98 cm.  When he was seen by [name] had recommended a subsequent echo which apparently was done by the state and interpreted at [facility] in [place] .  I reviewed this report.  Of note, there was no mention of aortic stenosis.  LV function was normal at 55% there was mild TR, moderate MR and mild aortic insufficiency.  Following his hospitalization he had undergone endoscopy and colonoscopy and his previous mild positive stools were due to her radiation and he did not have any colonic mass or polyps.  When I saw him in January 2020 he was still residing at the correction center and denied any recurrent anginal symptoms.  He presented to the office for evaluation Miguel Garcia has been residing at the  correctional center.  He denied  recurrent anginal type symptoms.  During his initial office evaluation with me I reviewed his echo Doppler study and suggested he undergo a follow-up echo Doppler in at least 6 months for reevaluation of his aortic stenosis.  Since I saw him, he underwent an evaluation in September 2021 with [name], PA-C at which time he had no anginal complaints but had experienced recent hemoptysis.  CBC was obtained at that visit for close  monitoring.    He subsequently was found to have recurrent prostate cancer on PET scanning and although he was initially recommended to start hormonal therapy, coverage was denied and he subsequently was started on Xtandi hormone therapy and leuprolide injections..  He was seen by [name], PA-C on March 21, 2021 with a Designer, industrial/product.  Since his prior evaluation he had noticed some increased fatigue since starting treatment for his prostate CA.  He will try to stay active and was walking 1/2-3/4 of a mile per day.  He had experienced chronic dyspnea on exertion with strenuous activity she felt was attributed to his asthma and and had not changed in recent months.  He did have occasional vertigo and denied complaints of chest pain or syncope.  He underwent a follow-up echo Doppler study on April 20, 2021 which showed an EF of 55 to 60% with grade 2 diastolic dysfunction.  Was mild left ventricular hypertrophy the basal septal segment.  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  In addition his aortic valve was severely calcified and there was evidence for mild regurgitation with moderate aortic valve stenosis.  The mean gradient was 26 mm with a peak gradient of 44.34mHg.  Aortic valve area by VTI measure was 0.92 cm.  I last saw him in July 25, 2021 when he was in the office with his cDesigner, industrial/productfor further cardiologic evaluation.  [facility]  [facility]  He admits that his legs get tired with walking.  He does admit to exertional shortness of breath with some leg swelling.  He denies chest tightness, presyncope or syncope.  [facility]  He is on carvedilol 6.25 mg twice a day in addition to isosorbide 60 mg daily and continues to be on Plavix 75 mg daily.  He is on  pantoprazole for GERD.  He is diabetic on glipizide and pioglitazone.  He is on Flomax for his urinary issues.  He continues to be on rosuvastatin 20 mg for hyperlipidemia.  During that evaluation, I reviewed his most recent echo Doppler study which now showed at least moderately severe aortic valve stenosis with a mean gradient of 26 and peak gradient of 46.6 mmHg.  However he also had progressive mitral regurgitation which was now moderately severe.  At times he admitted to leg swelling for the past 4 to 5 months and I recommended initiation of furosemide 20 mg daily.  I recommended he undergo lower extremity arterial Doppler studies as well as transesophageal echocardiographic evaluation which would need to be approved by the correctional Institute.  On August 23, 2021 he underwent his TEE by Dr. AMargaretann Loveless  This revealed normal biventricular function.  He had severe eccentric mitral valve regurgitation with a flail segment on the posterior mitral leaflet on the P2 scallop with degenerative change/calcification.  There also was a  small independently mobile lesion on the atrial surface of the mitral valve.  There was some severe and eccentric anteriorly directed jet of MR, PV blunting and systolic reversal noted.  Study also confirmed moderate to severe aortic valve stenosis with a severely calcified aortic valve, mean gradient 31, AVA by TVI at 0.7 cm and dimensionless index 0.2, SVI of 26 and mild aortic regurgitation.  He is here in the office today with his Designer, industrial/product.  At times he admits to dizziness but denies chest pain.  He had shortness of breath with walking.  He typically walks 10 yards 5 times a day.  He has been on carvedilol 6.25 mg twice a day, furosemide 20 mg daily, isosorbide 60 mg, in addition to carvedilol 75 mg daily, glipizide insulin and Actos for his diabetes mellitus.  He is on rosuvastatin 20 mg for hyperlipidemia and pantoprazole for GERD.  He presents for  evaluation.  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"   Asthma    CAD (coronary artery disease)    CABG 14 days ago   CAD S/P percutaneous coronary angioplasty 03/31/2018   LM PCI   Chronic lower back pain    Heart murmur    High cholesterol    Hypertension    Myocardial infarction ([place]) 2006; 2007; 2012   Prostate cancer [facility])    "put 3 beamers in my prostate in 11/2017 & radiation completed in 02/2018" (03/31/2018)   Type II diabetes mellitus ([place])     Past Surgical History:  Procedure Laterality Date   BACK SURGERY     BUBBLE STUDY  08/23/2021   Procedure: BUBBLE STUDY;  Surgeon: [name], MD;  Location: [facility] ENDOSCOPY;  Service: Cardiology;;   CORONARY ANGIOPLASTY WITH STENT PLACEMENT  03/31/2018   CORONARY ARTERY BYPASS GRAFT  2007   CORONARY BALLOON ANGIOPLASTY N/A 08/17/2018   Procedure: CORONARY BALLOON ANGIOPLASTY;  Surgeon: Troy Sine, MD;  Location: [place] CV LAB;  Service: Cardiovascular;  Laterality: N/A;   CORONARY STENT INTERVENTION N/A 03/31/2018   Procedure: CORONARY STENT INTERVENTION;  Surgeon: Troy Sine, MD;  Location: [place] CV LAB;  Service: Cardiovascular;  Laterality: N/A;   CORONARY STENT INTERVENTION N/A 08/17/2018   Procedure: CORONARY STENT INTERVENTION;  Surgeon: Troy Sine, MD;  Location: [place] CV LAB;  Service: Cardiovascular;  Laterality: N/A;   FOREARM FRACTURE SURGERY Right    FRACTURE SURGERY     HEMORRHOID BANDING     LAPAROSCOPIC CHOLECYSTECTOMY     LEFT HEART CATH AND CORONARY ANGIOGRAPHY N/A 03/31/2018   Procedure: LEFT HEART CATH AND CORONARY ANGIOGRAPHY;  Surgeon: Troy Sine, MD;  Location: [place] CV LAB;  Service: Cardiovascular;  Laterality: N/A;   LEFT HEART CATH AND CORS/GRAFTS ANGIOGRAPHY N/A 08/17/2018   Procedure: LEFT HEART CATH AND CORS/GRAFTS ANGIOGRAPHY;  Surgeon: Troy Sine, MD;  Location: [place] CV LAB;  Service: Cardiovascular;  Laterality: N/A;   ORIF SHOULDER FRACTURE Left    POSTERIOR FUSION LUMBAR SPINE     L4-5   PROSTATE BIOPSY     "put in 3 [employer] Transponder Implants in 11/2017"   TEE WITHOUT CARDIOVERSION N/A 08/23/2021   Procedure: TRANSESOPHAGEAL ECHOCARDIOGRAM (TEE);  Surgeon: [name], MD;  Location: [place];  Service: Cardiology;  Laterality: N/A;    Current Medications:  Outpatient Medications Prior to Visit  Medication Sig Dispense Refill   acetaminophen (TYLENOL) 325 MG tablet Take 650 mg by mouth 3 (three) times daily as needed for moderate pain.     albuterol (PROVENTIL HFA;VENTOLIN HFA) 108 (90 Base) MCG/ACT inhaler Inhale 2 puffs into the lungs 4 (four) times daily as needed for wheezing or shortness of breath.     Calcium Citrate-Vitamin D 315-250 MG-UNIT TABS Take 2 tablets by mouth in the morning and at bedtime.     carvedilol (COREG) 6.25 MG tablet Take 6.25 mg by mouth 2 (two) times daily.     clopidogrel (PLAVIX) 75 MG tablet Take 75 mg by mouth daily.     Dextran 70-Hypromellose (ARTIFICIAL TEARS) 0.1-0.3 % SOLN Place 1 drop into both eyes 4 (four) times daily as needed (burning).     docusate sodium (COLACE) 100 MG capsule Take 100 mg by mouth 2 (two) times daily.     DULoxetine (CYMBALTA) 20 MG capsule Take 20 mg by mouth at bedtime.     enzalutamide (XTANDI) 40 MG capsule Take 160 mg by mouth daily.     furosemide (LASIX) 20 MG tablet Take 20 mg every other day 90 tablet 3   gabapentin (NEURONTIN) 300 MG capsule Take 600 mg by mouth 3 (three) times daily.      insulin glargine (LANTUS) 100 UNIT/ML injection Inject into the skin.     isosorbide mononitrate (IMDUR) 30 MG 24 hr tablet Take 1 tablet (30 mg total) by mouth daily. 90 tablet 3   meclizine (ANTIVERT) 25 MG tablet Take 25 mg by mouth 3 (three) times daily as needed for dizziness.     nitroGLYCERIN (NITROSTAT) 0.4 MG SL tablet Place 1 tablet  (0.4 mg total) under the tongue every 5 (five) minutes as needed for chest pain. 30 tablet 11   OVER THE COUNTER MEDICATION Take 1 capsule by mouth in the morning and at bedtime. FE/B12/C/FA/SA     pantoprazole (PROTONIX) 20 MG tablet Take 1 tablet (20 mg total) by mouth 2 (two) times daily. 180 tablet 3   pioglitazone (ACTOS) 15 MG tablet Take 15 mg by mouth daily.     rosuvastatin (CRESTOR) 20 MG tablet Take 20 mg by mouth daily.     tamsulosin (FLOMAX) 0.4 MG CAPS capsule Take 0.4 mg by mouth 2 (two) times daily.     Vitamin D, Ergocalciferol, (DRISDOL) 1.25 MG (50000 UNIT) CAPS capsule Take 50,000 Units by mouth every 14 (fourteen) days.     furosemide (LASIX) 20 MG tablet Take 1 tablet (20 mg total) by mouth daily. If swelling persists, increase to 2 tablets (57m) daily. 90 tablet 3   isosorbide mononitrate (IMDUR) 60 MG 24 hr tablet Take 1 tablet (60 mg total) by mouth daily. 30 tablet 0   albuterol (PROVENTIL) (2.5 MG/3ML) 0.083% nebulizer solution Take 2.5 mg by nebulization every 6 (six) hours as needed for wheezing or shortness of breath. (Patient not taking: Reported on 11/05/2021)     glipiZIDE (GLUCOTROL XL) 10 MG 24 hr tablet Take 10 mg by mouth 2 (two) times daily. (Patient not taking: Reported on 11/05/2021)     No facility-administered medications prior to visit.     Allergies:   Lisinopril   Social History   Socioeconomic History   Marital status: Divorced    Spouse name: Not on file   Number of children: Not on file   Years of education: Not on file   Highest education level: Not on  file  Occupational History   Not on file  Tobacco Use   Smoking status: Never   Smokeless tobacco: Former    Types: Nurse, children's Use: Never used  Substance and Sexual Activity   Alcohol use: Not Currently   Drug use: Not Currently   Sexual activity: Not Currently  Other Topics Concern   Not on file  Social History Narrative   Not on file   Social Determinants of  Health   Financial Resource Strain: Not on file  Food Insecurity: Not on file  Transportation Needs: Not on file  Physical Activity: Not on file  Stress: Not on file  Social Connections: Not on file   Family History:  The patient's family history includes Hypertension in his mother.   ROS General: Negative; No fevers, chills, or night sweats;  HEENT: Negative; No changes in vision or hearing, sinus congestion, difficulty swallowing Pulmonary: Negative; No cough, wheezing, shortness of breath, hemoptysis Cardiovascular: See HPI GI: Negative; No nausea, vomiting, diarrhea, or abdominal pain GU: History of prostate CA Musculoskeletal: Negative; no myalgias, joint pain, or weakness Hematologic/Oncology: Prostate CA Endocrine: Negative; no heat/cold intolerance; no diabetes Neuro: Mild dizziness Skin: Negative; No rashes or skin lesions Psychiatric: Negative; No behavioral problems, depression Sleep: Negative; No snoring, daytime sleepiness, hypersomnolence, bruxism, restless legs, hypnogognic hallucinations, no cataplexy Other comprehensive 14 point system review is negative.   PHYSICAL EXAM:   VS:  BP 98/66   Pulse 76   Ht _0  (1.727 m)   Wt 194 lb 9.6 oz (88.3 kg)   SpO2 97%   BMI 29.59 kg/m     Repeat blood pressure by me was 104/64 supine and his blood pressure decreased to 86/60 standing.  Wt Readings from Last 3 Encounters:  11/05/21 194 lb 9.6 oz (88.3 kg)  08/23/21 193 lb (87.5 kg)  07/25/21 195 lb 9.6 oz (88.7 kg)    General: Alert, oriented, no distress.  Skin: normal turgor, no rashes, warm and dry HEENT: Normocephalic, atraumatic. Pupils equal round and reactive to light; sclera anicteric; extraocular muscles intact;  Nose without nasal septal hypertrophy Mouth/Parynx benign; Mallinpatti scale 3 Neck: No JVD, no carotid bruits; normal carotid upstroke Lungs: clear to ausculatation and percussion; no wheezing or rales Chest wall: without tenderness to  palpitation Heart: PMI not displaced, RRR, s1 s2 normal, 2/6 harsh systolic murmur in the aortic area and 3/6 murmur at the apex radiating to his axilla, no diastolic murmur, no rubs, gallops, thrills, or heaves Abdomen: soft, nontender; no hepatosplenomehaly, BS+; abdominal aorta nontender and not dilated by palpation. Back: no CVA tenderness Pulses 2+ Musculoskeletal: full range of motion, normal strength, no joint deformities Extremities: Trace edema no clubbing cyanosis, Homan's sign negative  Neurologic: grossly nonfocal; Cranial nerves grossly wnl Psychologic: Normal mood and affect   Studies/Labs Reviewed:   November 04, 2021   ECG (independently read by me): NSR at 76, T wave abnormality; QTc 481 msec  July 25, 2021 ECG (independently read by me): NSR at 73, LAE, nonspecific T wave abnormality; QTc 462 msec    January 2020 ECG (independently read by me): Normal sinus rhythm at 72 bpm.  Inferior Q waves in lead III.  T wave abnormality in leads I and aVL.  Normal intervals.  Recent Labs: BMP Latest Ref Rng & Units 08/19/2018 08/18/2018 08/17/2018  Glucose 70 - 99 mg/dL 122(H) 114(H) 121(H)  BUN 8 - 23 mg/dL _1 Creatinine 0.61 - 1.24  mg/dL 1.14 0.96 1.04  Sodium 135 - 145 mmol/L 139 139 139  Potassium 3.5 - 5.1 mmol/L 3.4(L) 3.8 3.8  Chloride 98 - 111 mmol/L 107 108 107  CO2 22 - 32 mmol/L _0 Calcium 8.9 - 10.3 mg/dL 9.4 9.5 9.5     Hepatic Function Latest Ref Rng & Units 08/19/2018 04/01/2018  Total Protein 6.5 - 8.1 g/dL 6.3(L) 5.6(L)  Albumin 3.5 - 5.0 g/dL 3.5 3.0(L)  AST 15 - 41 U/L 20 30  ALT 0 - 44 U/L 15 28  Alk Phosphatase 38 - 126 U/L 66 62  Total Bilirubin 0.3 - 1.2 mg/dL 0.8 0.5  Bilirubin, Direct 0.1 - 0.5 mg/dL - 0.1    CBC Latest Ref Rng & Units 09/04/2020 08/19/2018 08/18/2018  WBC 3.4 - 10.8 x10E3/uL 6.1 5.0 5.5  Hemoglobin 13.0 - 17.7 g/dL 15.8 10.8(L) 12.9(L)  Hematocrit 37.5 - 51.0 % 45.9 31.3(L) 38.4(L)  Platelets 150 - 450 x10E3/uL 166  144(L) 179   Lab Results  Component Value Date   MCV 93 09/04/2020   MCV 93.2 08/19/2018   MCV 93.9 08/18/2018   No results found for: TSH Lab Results  Component Value Date   HGBA1C 6.1 (H) 08/15/2018     BNP No results found for: BNP  ProBNP No results found for: PROBNP   Lipid Panel     Component Value Date/Time   CHOL 119 08/15/2018 0632   TRIG 80 08/15/2018 0632   HDL 34 (L) 08/15/2018 0632   CHOLHDL 3.5 08/15/2018 0632   VLDL 16 08/15/2018 0632   LDLCALC 69 08/15/2018 8309     RADIOLOGY: No results found.   Additional studies/ records that were reviewed today include:  Reviewed his prior cardiac catheterization reports and interventions, echo Doppler study from [facility] as well as from the state and subsequent office visit with [name] and Francia Greaves, DO.  Office visits from [place], PA-C and [name], PA-C were reviewed.  ECHO: 04/20/2021 IMPRESSIONS   1. Left ventricular ejection fraction, by estimation, is 55 to 60%. The  left ventricle has normal function. The left ventricle demonstrates  regional wall motion abnormalities (see scoring diagram/findings for  description). There is mild left ventricular   hypertrophy of the basal-septal segment. Left ventricular diastolic  parameters are consistent with Grade II diastolic dysfunction  (pseudonormalization). Elevated left atrial pressure. There is moderate  hypokinesis of the left ventricular, basal-mid  inferolateral wall.   2. Right ventricular systolic function is normal. The right ventricular  size is normal. There is normal pulmonary artery systolic pressure.   3. Left atrial size was severely dilated.   4. The mitral valve is myxomatous. Moderate to severe mitral valve  regurgitation. No evidence of mitral stenosis. There is mild holosystolic  prolapse of the middle scallop of the posterior leaflet of the mitral  valve.   5. The aortic valve is tricuspid. There is  severe calcification of the  aortic valve. There is severe thickening of the aortic valve. Aortic valve  regurgitation is mild. Moderate aortic valve stenosis. The aortic valve  area and the dimensionless index  overestimate the severity of aortic stenosis due to erroneous placement of  the LVOT pulsed Doppler sample volume.   6. The inferior vena cava is normal in size with greater than 50%  respiratory variability, suggesting right atrial pressure of 3 mmHg.   Comparison(s): Prior images reviewed side by side. The left ventricular  function is unchanged. The  left ventricular wall motion abnormality is  unchanged. Aortic stenosis has worsened. Mitral insufficiency was probably  underestimated on the previous echo,   but has also probably worsened.    TEE: 08/23/2021 IMPRESSIONS   1. Left ventricular ejection fraction, by estimation, is 55 to 60%. The  left ventricle has normal function. The left ventricle demonstrates  regional wall motion abnormalities (see scoring diagram/findings for  description).   2. Right ventricular systolic function is normal. The right ventricular  size is normal.   3. Left atrial size was severely dilated. No left atrial/left atrial  appendage thrombus was detected. The LAA emptying velocity was 61 cm/s.   4. Right atrial size was mildly dilated.   5. Severe, eccentric mitral valve regurgitation. There is a flail segment  on the posterior mitral leaflet on the P2 scallop with degenerative  change/calcification. There is also a small independently mobile lesion on  the atrial surface of the mitral  valve. Cannot exclude degenerative change from prior endocarditis (best  seen in clip 68). Severe and eccentric anteriorly direct jet of mitral  valve regurgitation. PV blunting and systolic reversal noted. The mitral  valve is abnormal. Severe mitral valve   regurgitation. No evidence of mitral stenosis.   6. The aortic valve is abnormal. There is severe  calcifcation of the  aortic valve. Aortic valve regurgitation is mild. Moderate to severe  aortic valve stenosis. Aortic valve area, by VTI measures 0.70 cm. Aortic  valve mean gradient measures 30.5 mmHg.  Aortic valve Vmax measures 3.55 m/s.   7. Cannot exclude small PFO with left to right shunt by color flow..  Agitated saline contrast bubble study was negative, with no evidence of  right to left shunt.   8. There is Moderate (Grade III) atheroma plaque involving the transverse  and descending aorta.   Conclusion(s)/Recommendation(s): Consider inflammatory markers and blood  cultures to exclude endocarditis.   FINDINGS   Left Ventricle: Left ventricular ejection fraction, by estimation, is 55  to 60%. The left ventricle has normal function. The left ventricle  demonstrates regional wall motion abnormalities. The left ventricular  internal cavity size was normal in size.      LV Wall Scoring:  The antero-lateral wall is hypokinetic.   Right Ventricle: The right ventricular size is normal. No increase in  right ventricular wall thickness. Right ventricular systolic function is  normal.   Left Atrium: Left atrial size was severely dilated. No left atrial/left  atrial appendage thrombus was detected. The LAA emptying velocity was 61  cm/s.   Right Atrium: Right atrial size was mildly dilated.   Pericardium: There is no evidence of pericardial effusion.   Mitral Valve: Severe, eccentric mitral valve regurgitation. There is a  flail segment on the posterior mitral leaflet on the P2 scallop with  degenerative change/calcification. There is also a small independently  mobile lesion on the atrial surface of the  mitral valve. Cannot exclude degenerative change from prior endocarditis  (best seen in clip 68). Severe and eccentric anteriorly direct jet of  mitral valve regurgitation. PV blunting and systolic reversal noted. The  mitral valve is abnormal. Severe  mitral valve  regurgitation. No evidence of mitral valve stenosis.   Tricuspid Valve: The tricuspid valve is grossly normal. Tricuspid valve  regurgitation is mild.   Aortic Valve: The aortic valve is abnormal. There is severe calcifcation  of the aortic valve. Aortic valve regurgitation is mild. Moderate to  severe aortic stenosis is present. Aortic valve  mean gradient measures  30.5 mmHg. Aortic valve peak gradient  measures 50.4 mmHg. Aortic valve area, by VTI measures 0.70 cm.   Pulmonic Valve: The pulmonic valve was normal in structure. Pulmonic valve  regurgitation is trivial.   Aorta: The aortic root and ascending aorta are structurally normal, with  no evidence of dilitation. There is moderate (Grade III) atheroma plaque  involving the transverse and descending aorta.   IAS/Shunts: Cannot exclude small PFO with left to right shunt by color  flow. Agitated saline contrast was given intravenously to evaluate for  intracardiac shunting. Agitated saline contrast bubble study was negative,  with no evidence of any interatrial  shunt.      LEFT VENTRICLE  PLAX 2D  LVOT diam:     2.10 cm  LV SV:         57  LV SV Index:   28  LVOT Area:     3.46 cm      AORTIC VALVE  AV Area (Vmax):    0.73 cm  AV Area (Vmean):   0.69 cm  AV Area (VTI):     0.70 cm  AV Vmax:           355.00 cm/s  AV Vmean:          262.000 cm/s  AV VTI:            0.812 m  AV Peak Grad:      50.4 mmHg  AV Mean Grad:      30.5 mmHg  LVOT Vmax:         74.86 cm/s  LVOT Vmean:        52.164 cm/s  LVOT VTI:          0.164 m  LVOT/AV VTI ratio: 0.20     AORTA  Ao Root diam: 3.30 cm  Ao Asc diam:  3.50 cm   MR Peak grad:    154.4 mmHg  MR Mean grad:    81.0 mmHg   SHUNTS  MR Vmax:         621.26 cm/s Systemic VTI:  0.16 m  MR Vmean:        442.9 cm/s  Systemic Diam: 2.10 cm  MR PISA Nyquist: 3.9 m/s  MR PISA:         9.05 cm  MR PISA Eff ROA: 56 mm  MR PISA Radius:  1.20 cm   Cherlynn Kaiser MD   Electronically signed by Cherlynn Kaiser MD  Signature Date/Time: 08/23/2021/11:59:38 AM   ASSESSMENT:    1. Moderately-severe aortic stenosis   2. Severe nonrheumatic mitral valve regurgitation   3. CAD S/P percutaneous coronary angioplasty   4. Hx of CABG   5. Orthostatic hypotension   6. Dyslipidemia, goal LDL below 70   7. Type 2 diabetes mellitus with complication, with long-term current use of insulin [facility])     PLAN:  Miguel Garcia is a 45 -year-old gentleman who has been incarcerated for approximately 15 years.  He has undergone initial CABG revascularization surgery with subsequent interventions.  I reviewed his catheterization in April  2019 with subsequent intervention as well as his last performed by me in August 2019.  Since his last intervention he has been without anginal symptoms and he underwent successful cutting balloon/PTCA of his distal left main stent as well as insertion of a new distal circumflex stent and PTCA of an OM1 stenosis jailed by his previous proximal to mid stents.  He is  currently without anginal symptomatology.  Remotely, he had rare episodes of guaiac positive stools.  On subsequent evaluation he was not having any bleeding off aspirin but continued to be on Brilinta.  He presently is on clopidogrel 75 mg daily instead of Brilinta.  He has noticed some increase in progressive shortness of breath with activity.  His legs tire with walking and cramp suggesting of possible claudication as well.  His echo Doppler study from April 2022 showed progressive valvular disease with now at least moderately severe aortic valve stenosis with a mean gradient of 26 mmHg and peak gradient of 44.6.  Aortic valve area by VTI measurement was 0.92.  There was evidence for mild aortic regurgitation.  However, he also developed progressive mitral regurgitation which is now moderate to severe.  He has noticed leg swelling for the past 4 to 5 months.  His blood pressure today on  recheck by me was mildly increased at 146/76.  Since he is incarcerated, it takes typically several weeks for him to get approval for additional procedures.  At his last office visit with me in August 2022 I scheduled him to undergo transesophageal echocardiography.  This was performed by Dr. Margaretann Loveless and I spent considerable time with him today discussing the results which confirms normal biventricular function, moderately severe aortic stenosis with severe eccentric mitral valve regurgitation with a flail segment of the posterior mitral valve leaflet with degenerative changes.  There also was a small independently mobile lesion on the atrial surface of the mitral valve and degenerative change from prior endocarditis could not be excluded.  On exam today his blood pressure is low and he is mildly orthostatic.  He has noticed some mild dizziness without chest pain.  Presently, I am reducing his isosorbide from 60 mg to 30 mg and I am reducing his furosemide from 20 mg daily to every other day.  He continues to be on carvedilol 6.25 mg twice a day and continues to be on clopidogrel.  I am recommending support stockings with 20 to 30 mm pressure support which will help his leg edema.  I also have recommended a referral for him to see Dr. Sherren Mocha in light of his valvular heart disease as part of the structural heart team.  This will be helpful to assess if his valve disease can be treated with mitral clip and TAVR versus the need for open surgical therapy.  It will take time for this to get approved by the correction facility but hopefully this can be expedited.  Patient denies any fevers chills or night sweats.  He continues to be on rosuvastatin for hyperlipidemia.  He is diabetic on glipizide and pioglitazone in addition to insulin.  Further recommendation will be made upon completion of the structural heart team evaluation.   Medication Adjustments/Labs and Tests Ordered: Current medicines are reviewed at  length with the patient today.  Concerns regarding medicines are outlined above.  Medication changes, Labs and Tests ordered today are listed in the Patient Instructions below. Patient Instructions  Medication Instructions:  Decrease Isosorbide to 30 mg daily  Decrease Furosemide to 20 mg every other day  Continue all other medications  *If you need a refill on your cardiac medications before your next appointment, please call your pharmacy*   Lab Work: None ordered   Testing/Procedures: None ordered   Follow-Up: At [facility], you and your health needs are our priority.  As part of our continuing mission to provide you with exceptional heart  care, we have created designated Provider Care Teams.  These Care Teams include your primary Cardiologist (physician) and Advanced Practice Providers (APPs -  Physician Assistants and Nurse Practitioners) who all work together to provide you with the care you need, when you need it.  We recommend signing up for the patient portal called "MyChart".  Sign up information is provided on this After Visit Summary.  MyChart is used to connect with patients for Virtual Visits (Telemedicine).  Patients are able to view lab/test results, encounter notes, upcoming appointments, etc.  Non-urgent messages can be sent to your provider as well.   To learn more about what you can do with MyChart, go to [email or web address].      Your next appointment:  4 to 6 weeks    The format for your next appointment: Office   Provider:  Dr.Cooper :1}    Wear compression hose during the day take off at bedtime   Signed, Shelva Majestic, MD  11/12/2021 1:05 PM    [place] [address], [place], [place], [place]  [address] Phone: [phone]

## 2021-11-05 NOTE — Patient Instructions (Signed)
Medication Instructions:  Decrease Isosorbide to 30 mg daily  Decrease Furosemide to 20 mg every other day  Continue all other medications  *If you need a refill on your cardiac medications before your next appointment, please call your pharmacy*   Lab Work: None ordered   Testing/Procedures: None ordered   Follow-Up: At [facility], you and your health needs are our priority.  As part of our continuing mission to provide you with exceptional heart care, we have created designated Provider Care Teams.  These Care Teams include your primary Cardiologist (physician) and Advanced Practice Providers (APPs -  Physician Assistants and Nurse Practitioners) who all work together to provide you with the care you need, when you need it.  We recommend signing up for the patient portal called "MyChart".  Sign up information is provided on this After Visit Summary.  MyChart is used to connect with patients for Virtual Visits (Telemedicine).  Patients are able to view lab/test results, encounter notes, upcoming appointments, etc.  Non-urgent messages can be sent to your provider as well.   To learn more about what you can do with MyChart, go to [email or web address].      Your next appointment:  4 to 6 weeks    The format for your next appointment: Office   Provider:  Dr.Cooper :1}    Wear compression hose during the day take off at bedtime

## 2021-11-12 ENCOUNTER — Encounter: Payer: Self-pay | Admitting: Cardiovascular Disease

## 2021-12-03 ENCOUNTER — Telehealth: Payer: Self-pay

## 2021-12-03 NOTE — Telephone Encounter (Signed)
"  This looks like a clippable valve. The fossa looks approachable for transseptal puncture in both the SAXB and Bicaval views. LA dimensions are large enough for device steering and straddle. 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 Posterior leaflet measures 1.14cm in the 126 LVOT view. MVA is measured 5.76cm2 in planimetry, gradient not provided-needs to be verified in pre procedure imaging. Based on this information, I'd start with an NTW or XTW, placing on the lateral side of A2/P2 and assess for gradient.  -TR noted"

## 2021-12-20 NOTE — Progress Notes (Signed)
Cardiology Office Note:    Date:  12/25/2021   ID:  Miguel Garcia, DOB 1946/09/13, MRN [id number]  PCP:  Patient, No Pcp Per (Inactive)   [facility] Providers Cardiologist:  Shelva Majestic, MD     Referring MD: No ref. provider found   Chief Complaint  Patient presents with   Shortness of Breath    History of Present Illness:    Miguel Garcia is a 75 y.o. male referred by Dr Claiborne Billings for evaluation of aortic stenosis and mitral regurgitation.   The patient is currently incarcerated in [facility].  He is here with a Curator today.  He has been followed by Dr. Claiborne Billings for many years.  He has a longstanding history of ischemic heart disease and ultimately was treated with multivessel CABG about 15 years ago.  He has undergone multiple PCI procedures in the interim.  His last heart catheterization was in 2019.  He currently is not having much trouble with angina.  However, he has developed progressive exertional dyspnea and is now short of breath with walking on level ground at relatively short distances of about 50 yards.  Imaging studies have demonstrated progression of aortic stenosis and have raise suspicion of severe eccentric mitral regurgitation.  He ultimately underwent a transesophageal echocardiogram in September 2022 demonstrating normal biventricular function, but confirming severe eccentric mitral regurgitation with a flail segment of the P2 scallop of the mitral valve.  There is a question of an area of healed endocarditis with a mobile echodensity on the atrial surface of the mitral valve.  There is systolic pulmonary vein flow reversal noted, confirming severe mitral regurgitation.  In addition, the aortic valve is demonstrated to be severely restricted and calcified with a mean transvalvular gradient of 31 mmHg and calculated aortic valve area of 0.7 cm with a dimensionless index of 0.2.  He also is shown to have mild aortic insufficiency.  The patient denies  symptoms of chest pain, chest pressure, orthopnea, PND, or leg edema.  He primarily exhibits symptoms of shortness of breath and fatigue.  He also notes a history of poor dentition.  He has had multiple teeth extracted and continues to undergo staged extractions of 2-3 teeth at a time.  Past Medical History:  Diagnosis Date   Anxiety    Arthritis    "hands, lower back,; L4-5;  neck; C1-2" (03/31/2018)   Asbestosis ([place])    "of my lungs"   Asthma    CAD (coronary artery disease)    CABG 14 days ago   CAD S/P percutaneous coronary angioplasty 03/31/2018   LM PCI   Chronic lower back pain    Heart murmur    High cholesterol    Hypertension    Myocardial infarction ([place]) 2006; 2007; 2012   Prostate cancer [facility])    "put 3 beamers in my prostate in 11/2017 & radiation completed in 02/2018" (03/31/2018)   Type II diabetes mellitus ([place])     Past Surgical History:  Procedure Laterality Date   BACK SURGERY     BUBBLE STUDY  08/23/2021   Procedure: BUBBLE STUDY;  Surgeon: [name], MD;  Location: [facility] ENDOSCOPY;  Service: Cardiology;;   CORONARY ANGIOPLASTY WITH STENT PLACEMENT  03/31/2018   CORONARY ARTERY BYPASS GRAFT  2007   CORONARY BALLOON ANGIOPLASTY N/A 08/17/2018   Procedure: CORONARY BALLOON ANGIOPLASTY;  Surgeon: Troy Sine, MD;  Location: [place] CV LAB;  Service: Cardiovascular;  Laterality: N/A;   CORONARY STENT INTERVENTION N/A 03/31/2018  Procedure: CORONARY STENT INTERVENTION;  Surgeon: Troy Sine, MD;  Location: [place] CV LAB;  Service: Cardiovascular;  Laterality: N/A;   CORONARY STENT INTERVENTION N/A 08/17/2018   Procedure: CORONARY STENT INTERVENTION;  Surgeon: Troy Sine, MD;  Location: [place] CV LAB;  Service: Cardiovascular;  Laterality: N/A;   FOREARM FRACTURE SURGERY Right    FRACTURE SURGERY     HEMORRHOID BANDING     LAPAROSCOPIC CHOLECYSTECTOMY     LEFT HEART CATH AND CORONARY ANGIOGRAPHY N/A 03/31/2018   Procedure: LEFT HEART CATH  AND CORONARY ANGIOGRAPHY;  Surgeon: Troy Sine, MD;  Location: [place] CV LAB;  Service: Cardiovascular;  Laterality: N/A;   LEFT HEART CATH AND CORS/GRAFTS ANGIOGRAPHY N/A 08/17/2018   Procedure: LEFT HEART CATH AND CORS/GRAFTS ANGIOGRAPHY;  Surgeon: Troy Sine, MD;  Location: [place] CV LAB;  Service: Cardiovascular;  Laterality: N/A;   ORIF SHOULDER FRACTURE Left    POSTERIOR FUSION LUMBAR SPINE     L4-5   PROSTATE BIOPSY     "put in 3 [employer]ponder Implants in 11/2017"   TEE WITHOUT CARDIOVERSION N/A 08/23/2021   Procedure: TRANSESOPHAGEAL ECHOCARDIOGRAM (TEE);  Surgeon: [name], MD;  Location: [place];  Service: Cardiology;  Laterality: N/A;    Current Medications: Current Meds  Medication Sig   acetaminophen (TYLENOL) 325 MG tablet Take 650 mg by mouth 3 (three) times daily as needed for moderate pain.   albuterol (PROVENTIL HFA;VENTOLIN HFA) 108 (90 Base) MCG/ACT inhaler Inhale 2 puffs into the lungs 4 (four) times daily as needed for wheezing or shortness of breath.   albuterol (PROVENTIL) (2.5 MG/3ML) 0.083% nebulizer solution Take 2.5 mg by nebulization every 6 (six) hours as needed for wheezing or shortness of breath.   Calcium Citrate-Vitamin D 315-250 MG-UNIT TABS Take 2 tablets by mouth in the morning and at bedtime.   carvedilol (COREG) 6.25 MG tablet Take 6.25 mg by mouth 2 (two) times daily.   clopidogrel (PLAVIX) 75 MG tablet Take 75 mg by mouth daily.   Dextran 70-Hypromellose (ARTIFICIAL TEARS) 0.1-0.3 % SOLN Place 1 drop into both eyes 4 (four) times daily as needed (burning).   docusate sodium (COLACE) 100 MG capsule Take 100 mg by mouth 2 (two) times daily.   DULoxetine (CYMBALTA) 20 MG capsule Take 20 mg by mouth at bedtime.   enzalutamide (XTANDI) 40 MG capsule Take 160 mg by mouth daily.   furosemide (LASIX) 20 MG tablet Take 20 mg every other day   gabapentin (NEURONTIN) 300 MG capsule Take 600 mg by mouth 3 (three) times daily.     insulin glargine (LANTUS) 100 UNIT/ML injection Inject into the skin.   isosorbide mononitrate (IMDUR) 30 MG 24 hr tablet Take 15 mg by mouth daily.   meclizine (ANTIVERT) 25 MG tablet Take 25 mg by mouth 3 (three) times daily as needed for dizziness.   nitroGLYCERIN (NITROSTAT) 0.4 MG SL tablet Place 1 tablet (0.4 mg total) under the tongue every 5 (five) minutes as needed for chest pain.   OVER THE COUNTER MEDICATION Take 1 capsule by mouth in the morning and at bedtime. FE/B12/C/FA/SA   pantoprazole (PROTONIX) 20 MG tablet Take 1 tablet (20 mg total) by mouth 2 (two) times daily.   pioglitazone (ACTOS) 15 MG tablet Take 15 mg by mouth daily.   rosuvastatin (CRESTOR) 20 MG tablet Take 20 mg by mouth daily.   tamsulosin (FLOMAX) 0.4 MG CAPS capsule Take 0.4 mg by mouth 2 (two) times  daily.   Vitamin D, Ergocalciferol, (DRISDOL) 1.25 MG (50000 UNIT) CAPS capsule Take 50,000 Units by mouth every 14 (fourteen) days.     Allergies:   Lisinopril   Social History   Socioeconomic History   Marital status: Divorced    Spouse name: Not on file   Number of children: Not on file   Years of education: Not on file   Highest education level: Not on file  Occupational History   Not on file  Tobacco Use   Smoking status: Never   Smokeless tobacco: Former    Types: Nurse, children's Use: Never used  Substance and Sexual Activity   Alcohol use: Not Currently   Drug use: Not Currently   Sexual activity: Not Currently  Other Topics Concern   Not on file  Social History Narrative   Not on file   Social Determinants of Health   Financial Resource Strain: Not on file  Food Insecurity: Not on file  Transportation Needs: Not on file  Physical Activity: Not on file  Stress: Not on file  Social Connections: Not on file     Family History: The patient's family history includes Hypertension in his mother.  ROS:   Please see the history of present illness.    All other systems  reviewed and are negative.  EKGs/Labs/Other Studies Reviewed:    The following studies were reviewed today: TEE: 1. Left ventricular ejection fraction, by estimation, is 55 to 60%. The  left ventricle has normal function. The left ventricle demonstrates  regional wall motion abnormalities (see scoring diagram/findings for  description).   2. Right ventricular systolic function is normal. The right ventricular  size is normal.   3. Left atrial size was severely dilated. No left atrial/left atrial  appendage thrombus was detected. The LAA emptying velocity was 61 cm/s.   4. Right atrial size was mildly dilated.   5. Severe, eccentric mitral valve regurgitation. There is a flail segment  on the posterior mitral leaflet on the P2 scallop with degenerative  change/calcification. There is also a small independently mobile lesion on  the atrial surface of the mitral  valve. Cannot exclude degenerative change from prior endocarditis (best  seen in clip 68). Severe and eccentric anteriorly direct jet of mitral  valve regurgitation. PV blunting and systolic reversal noted. The mitral  valve is abnormal. Severe mitral valve   regurgitation. No evidence of mitral stenosis.   6. The aortic valve is abnormal. There is severe calcifcation of the  aortic valve. Aortic valve regurgitation is mild. Moderate to severe  aortic valve stenosis. Aortic valve area, by VTI measures 0.70 cm. Aortic  valve mean gradient measures 30.5 mmHg.  Aortic valve Vmax measures 3.55 m/s.   7. Cannot exclude small PFO with left to right shunt by color flow..  Agitated saline contrast bubble study was negative, with no evidence of  right to left shunt.   8. There is Moderate (Grade III) atheroma plaque involving the transverse  and descending aorta.   Conclusion(s)/Recommendation(s): Consider inflammatory markers and blood  cultures to exclude endocarditis.   LEFT VENTRICLE  PLAX 2D  LVOT diam:     2.10 cm  LV SV:          57  LV SV Index:   28  LVOT Area:     3.46 cm      AORTIC VALVE  AV Area (Vmax):    0.73 cm  AV Area (Vmean):  0.69 cm  AV Area (VTI):     0.70 cm  AV Vmax:           355.00 cm/s  AV Vmean:          262.000 cm/s  AV VTI:            0.812 m  AV Peak Grad:      50.4 mmHg  AV Mean Grad:      30.5 mmHg  LVOT Vmax:         74.86 cm/s  LVOT Vmean:        52.164 cm/s  LVOT VTI:          0.164 m  LVOT/AV VTI ratio: 0.20     AORTA  Ao Root diam: 3.30 cm  Ao Asc diam:  3.50 cm   MR Peak grad:    154.4 mmHg  MR Mean grad:    81.0 mmHg   SHUNTS  MR Vmax:         621.26 cm/s Systemic VTI:  0.16 m  MR Vmean:        442.9 cm/s  Systemic Diam: 2.10 cm  MR PISA Nyquist: 3.9 m/s  MR PISA:         9.05 cm  MR PISA Eff ROA: 56 mm  MR PISA Radius:  1.20 cm   Echo: 1. Left ventricular ejection fraction, by estimation, is 55 to 60%. The  left ventricle has normal function. The left ventricle demonstrates  regional wall motion abnormalities (see scoring diagram/findings for  description). There is mild left ventricular   hypertrophy of the basal-septal segment. Left ventricular diastolic  parameters are consistent with Grade II diastolic dysfunction  (pseudonormalization). Elevated left atrial pressure. There is moderate  hypokinesis of the left ventricular, basal-mid  inferolateral wall.   2. Right ventricular systolic function is normal. The right ventricular  size is normal. There is normal pulmonary artery systolic pressure.   3. Left atrial size was severely dilated.   4. The mitral valve is myxomatous. Moderate to severe mitral valve  regurgitation. No evidence of mitral stenosis. There is mild holosystolic  prolapse of the middle scallop of the posterior leaflet of the mitral  valve.   5. The aortic valve is tricuspid. There is severe calcification of the  aortic valve. There is severe thickening of the aortic valve. Aortic valve  regurgitation is mild. Moderate  aortic valve stenosis. The aortic valve  area and the dimensionless index  overestimate the severity of aortic stenosis due to erroneous placement of  the LVOT pulsed Doppler sample volume.   6. The inferior vena cava is normal in size with greater than 50%  respiratory variability, suggesting right atrial pressure of 3 mmHg.   Comparison(s): Prior images reviewed side by side. The left ventricular  function is unchanged. The left ventricular wall motion abnormality is  unchanged. Aortic stenosis has worsened. Mitral insufficiency was probably  underestimated on the previous echo,   but has also probably worsened.   Cardiac Cath 2019: Conclusion    Ost LAD to Prox LAD lesion is 100% stenosed. 1st Diag lesion is 50% stenosed. Prox LAD to Mid LAD lesion is 10% stenosed. Ost 3rd Mrg lesion is 40% stenosed. Previously placed Ost Cx to Prox Cx stent (unknown type) is widely patent. Previously placed Prox Cx stent (unknown type) is widely patent. Prox RCA lesion is 35% stenosed. Mid RCA lesion is 30% stenosed. Mid LM to Dist LM lesion is 80% stenosed.  Prox Cx to Mid Cx lesion is 15% stenosed. A stent was successfully placed. Post intervention, there is a 0% residual stenosis. Mid Cx to Dist Cx lesion is 90% stenosed. Post intervention, there is a 0% residual stenosis. Ost 2nd Mrg lesion is 85% stenosed. Post intervention, there is a 10% residual stenosis. Post intervention, there is a 50% residual stenosis.   Significant of coronary obstructive disease with 80% in-stent restenosis in the distal left main stent.   Old total ostial occlusion of the LAD.   Patent proximal left circumflex stent with 15% intimal hyperplasia in the mid circumflex stent followed by eccentric 90% stenosis distal to the mid circumflex stent and 85% ostial stenosis in the OM1 vessel which is jailed by stent overlap of the proximal and mid  previously placed stents.   Dominant RCA with 35% smooth stenosis  proximally and 30% stenosis in the mid segment.   Right and left internal mammary artery supplying the mid LAD with 50% narrowing in the diagonal vessel and patent proximal LAD stent.   Successful multilesion intervention with Cutting Balloon/PTCA of the distal left main stent with the 80% stenosis being reduced to less than 15%; successful stenting of the distal circumflex with insertion of a 2.0 x 12 mm Resolute DES stent postdilated to 2.25 mm with a 90% stenosis being reduced to 0%, and PTCA of the ostium of the OM1 vessel eccentric 85% stenosis reduced  to 50%.   RECOMMENDATION: Medical therapy for concomitant CAD.  High potency statin therapy with target LDL less than 70.  Optimal blood pressure control.  Recommend follow-up 2D echo Doppler study to reassess LV function. Recommend dual antiplatelet therapy with ASA 81 mg and Ticagrelor 90mg  twice daily long-term (beyond 12 months) because of multiple stents and left main involvement in this patient status post prior CABG revascularization. Diagnostic Dominance: Right Intervention    Recent Labs: 12/21/2021: BUN 27; Creatinine, Ser 0.96; Hemoglobin 13.3; Platelets 177; Potassium 4.4; Sodium 142  Recent Lipid Panel    Component Value Date/Time   CHOL 119 08/15/2018 0632   TRIG 80 08/15/2018 0632   HDL 34 (L) 08/15/2018 0632   CHOLHDL 3.5 08/15/2018 0632   VLDL 16 08/15/2018 0632   LDLCALC 69 08/15/2018 0632     Risk Assessment/Calculations:     STS RIsk Calculator: Isolated AVR: Risk of Mortality: 2.321% Renal Failure: 3.165% Permanent Stroke: 2.219% Prolonged Ventilation: 8.026% DSW Infection: 0.159% Reoperation: 5.724% Morbidity or Mortality: 14.059% Short Length of Stay: 31.585% Long Length of Stay: 5.939%  Mitral Valve Repair: Risk of Mortality: 2.358% Renal Failure: 2.311% Permanent Stroke: 4.130% Prolonged Ventilation: 7.665% DSW Infection: 0.094% Reoperation: 4.147% Morbidity or  Mortality: 11.872% Short Length of Stay: 37.918% Long Length of Stay: 6.339%  Mitral Valve Replacement: Risk of Mortality: 4.613% Renal Failure: 4.698% Permanent Stroke: 3.195% Prolonged Ventilation: 14.017% DSW Infection: 0.180% Reoperation: 7.341% Morbidity or Mortality: 17.749% Short Length of Stay: 19.973% Long Length of Stay: 8.388%      Physical Exam:    VS:  BP 102/78    Pulse 78    Ht 5\' 9"  (1.753 m)    Wt 195 lb (88.5 kg)    SpO2 97%    BMI 28.80 kg/m     Wt Readings from Last 3 Encounters:  12/21/21 195 lb (88.5 kg)  11/05/21 194 lb 9.6 oz (88.3 kg)  08/23/21 193 lb (87.5 kg)     GEN:  Well nourished, well developed in no acute distress HEENT: Poor dentition with  multiple missing teeth NECK: No JVD; No carotid bruits LYMPHATICS: No lymphadenopathy CARDIAC: RRR, 3/6 holosystolic murmur at the apex, 2/6 harsh crescendo decrescendo murmur at the right upper sternal border RESPIRATORY:  Clear to auscultation without rales, wheezing or rhonchi  ABDOMEN: Soft, non-tender, non-distended MUSCULOSKELETAL:  No edema; No deformity  SKIN: Warm and dry NEUROLOGIC:  Alert and oriented x 3 PSYCHIATRIC:  Normal affect   ASSESSMENT:    1. Nonrheumatic mitral (valve) insufficiency   2. Nonrheumatic aortic valve stenosis   3. Coronary artery disease involving native coronary artery of native heart without angina pectoris    PLAN:    In order of problems listed above:  The patient has New York Heart Association functional class III symptoms.  He can walk about 50 yards and then he develops dyspnea and fatigue.  I personally reviewed his transesophageal echo images which demonstrate a flail segment of P2 with severe eccentric mitral regurgitation confirmed by pulmonary vein flow reversal.  There is a mobile echodensity on the atrial surface of the mitral valve which could be related to healed endocarditis.  We reviewed the natural history of primary mitral  regurgitation today.  His situation is complicated by the presence of extensive ischemic heart disease and significant aortic stenosis.  I am not sure that he would be a candidate for redo cardiac surgery with aortic valve replacement and mitral valve repair.  However, he might be a candidate for transcatheter edge-to-edge repair of the mitral valve and TAVR.  See discussion below.  Further diagnostic testing is necessary.  I have recommended right and left heart catheterization to assess coronary anatomy and cardiac hemodynamics. I have reviewed the risks, indications, and alternatives to cardiac catheterization, possible angioplasty, and stenting with the patient. Risks include but are not limited to bleeding, infection, vascular injury, stroke, myocardial infection, arrhythmia, kidney injury, radiation-related injury in the case of prolonged fluoroscopy use, emergency cardiac surgery, and death. The patient understands the risks of serious complication is 1-2 in 5809 with diagnostic cardiac cath and 1-2% or less with angioplasty/stenting.  The patient's TEE images show a severely restricted aortic valve with Doppler data suggestive of severe aortic stenosis.  Cardiac catheterization will further confirm the invasive hemodynamics and severity of his aortic stenosis.  I have recommended CTA studies of the heart as well as the chest, abdomen, and pelvis to evaluate anatomic feasibility of TAVR.  Aortic valve calcium score will be measured as well.  Once his imaging studies are completed, he will be referred for formal cardiac surgical consultation as part of a multidisciplinary heart team evaluation. The patient does not appear to have any active anginal symptoms.  He has undergone multiple PCI procedures and ultimately was treated with multivessel CABG.     Shared Decision Making/Informed Consent The risks [stroke (1 in 1000), death (1 in 1000), kidney failure [usually temporary] (1 in 500), bleeding (1 in  200), allergic reaction [possibly serious] (1 in 200)], benefits (diagnostic support and management of coronary artery disease) and alternatives of a cardiac catheterization were discussed in detail with Mr. Kosman and he is willing to proceed.    Medication Adjustments/Labs and Tests Ordered: Current medicines are reviewed at length with the patient today.  Concerns regarding medicines are outlined above.  Orders Placed This Encounter  Procedures   CBC   Basic metabolic panel   No orders of the defined types were placed in this encounter.   Patient Instructions  Medication Instructions:  Your physician recommends that  you continue on your current medications as directed. Please refer to the Current Medication list given to you today.  *If you need a refill on your cardiac medications before your next appointment, please call your pharmacy*   Lab Work: Bmp, Cbc- Today  If you have labs (blood work) drawn today and your tests are completely normal, you will receive your results only by: [place] (if you have MyChart) OR A paper copy in the mail If you have any lab test that is abnormal or we need to change your treatment, we will call you to review the results.   Testing/Procedures: Your physician has requested that you have a cardiac catheterization. Cardiac catheterization is used to diagnose and/or treat various heart conditions. Doctors may recommend this procedure for a number of different reasons. The most common reason is to evaluate chest pain. Chest pain can be a symptom of coronary artery disease (CAD), and cardiac catheterization can show whether plaque is narrowing or blocking your hearts arteries. This procedure is also used to evaluate the valves, as well as measure the blood flow and oxygen levels in different parts of your heart. For further information please visit [email or web address].[email or web address]. Please follow instruction sheet, as given.    Follow-Up: At [facility], you and your health needs are our priority.  As part of our continuing mission to provide you with exceptional heart care, we have created designated Provider Care Teams.  These Care Teams include your primary Cardiologist (physician) and Advanced Practice Providers (APPs -  Physician Assistants and Nurse Practitioners) who all work together to provide you with the care you need, when you need it.  We recommend signing up for the patient portal called "MyChart".  Sign up information is provided on this After Visit Summary.  MyChart is used to connect with patients for Virtual Visits (Telemedicine).  Patients are able to view lab/test results, encounter notes, upcoming appointments, etc.  Non-urgent messages can be sent to your provider as well.   To learn more about what you can do with MyChart, go to [email or web address].    Your next appointment:   2-3 week(s)  The format for your next appointment:   In Person  Provider:   Shelva Majestic, MD {    Other Instructions  [place] CAN GET PATIENT SET UP FOR HEART CATHERIZATION AND FOLLOW UP   [place] OFFICE [place], [place] [address] Dept: [phone] Loc: [place]  12/21/2021   1. Please arrive at the [facility] (Main Entrance A) at [facility]: [address] [place], [place] [address] at  (This time is two hours before your procedure to ensure your preparation). Free valet parking service is available.   Special note: Every effort is made to have your procedure done on time. Please understand that emergencies sometimes delay scheduled procedures.  2. Diet: Do not eat solid foods after midnight.  The patient may have clear liquids until 5am upon the day of the procedure.   3. Medication instructions in preparation for your procedure:  Hold Lasix the morning of your  procedure   Hold Glipizide the morning of your procedure   Hold Pioglitazone the morning of your procedure   Take 1/2 a dose of your insulin the night before your procedure but do not take any the day of your procedure     On the morning of  your procedure, take your Aspirin and Plavix/Clopidogrel and any morning medicines NOT listed above.  You may use sips of water.  5. Plan for one night stay--bring personal belongings. 6. Bring a current list of your medications and current insurance cards. 7. You MUST have a responsible person to drive you home. 8. Someone MUST be with you the first 24 hours after you arrive home or your discharge will be delayed. 9. Please wear clothes that are easy to get on and off and wear slip-on shoes.  Thank you for allowing [place] to care for you!   -- [facility] Invasive Cardiovascular services     Signed, Sherren Mocha, MD  12/25/2021 1:07 PM    [place]

## 2021-12-21 ENCOUNTER — Encounter: Payer: Self-pay | Admitting: Cardiovascular Disease

## 2021-12-21 ENCOUNTER — Ambulatory Visit (INDEPENDENT_AMBULATORY_CARE_PROVIDER_SITE_OTHER): Admitting: Cardiovascular Disease

## 2021-12-21 ENCOUNTER — Other Ambulatory Visit: Payer: Self-pay

## 2021-12-21 VITALS — BP 102/78 | HR 78 | Ht 69.0 in | Wt 195.0 lb

## 2021-12-21 DIAGNOSIS — I350 Nonrheumatic aortic (valve) stenosis: Secondary | ICD-10-CM

## 2021-12-21 DIAGNOSIS — I340 Nonrheumatic mitral (valve) insufficiency: Secondary | ICD-10-CM

## 2021-12-21 DIAGNOSIS — I2510 Atherosclerotic heart disease of native coronary artery without angina pectoris: Secondary | ICD-10-CM

## 2021-12-21 NOTE — Patient Instructions (Signed)
Medication Instructions:  Your physician recommends that you continue on your current medications as directed. Please refer to the Current Medication list given to you today.  *If you need a refill on your cardiac medications before your next appointment, please call your pharmacy*   Lab Work: Bmp, Cbc- Today  If you have labs (blood work) drawn today and your tests are completely normal, you will receive your results only by: [place] (if you have MyChart) OR A paper copy in the mail If you have any lab test that is abnormal or we need to change your treatment, we will call you to review the results.   Testing/Procedures: Your physician has requested that you have a cardiac catheterization. Cardiac catheterization is used to diagnose and/or treat various heart conditions. Doctors may recommend this procedure for a number of different reasons. The most common reason is to evaluate chest pain. Chest pain can be a symptom of coronary artery disease (CAD), and cardiac catheterization can show whether plaque is narrowing or blocking your hearts arteries. This procedure is also used to evaluate the valves, as well as measure the blood flow and oxygen levels in different parts of your heart. For further information please visit [email or web address]. Please follow instruction sheet, as given.    Follow-Up: At [facility], you and your health needs are our priority.  As part of our continuing mission to provide you with exceptional heart care, we have created designated Provider Care Teams.  These Care Teams include your primary Cardiologist (physician) and Advanced Practice Providers (APPs -  Physician Assistants and Nurse Practitioners) who all work together to provide you with the care you need, when you need it.  We recommend signing up for the patient portal called "MyChart".  Sign up information is provided on this After Visit Summary.  MyChart is used to connect with patients for  Virtual Visits (Telemedicine).  Patients are able to view lab/test results, encounter notes, upcoming appointments, etc.  Non-urgent messages can be sent to your provider as well.   To learn more about what you can do with MyChart, go to [email or web address].    Your next appointment:   2-3 week(s)  The format for your next appointment:   In Person  Provider:   Shelva Majestic, MD {    Other Instructions  [place] CAN GET PATIENT SET UP FOR HEART CATHERIZATION AND FOLLOW UP   [facility] OFFICE [place], [place] [address] Dept: [phone] Loc: [place]  12/21/2021   1. Please arrive at the [facility] (Main Entrance A) at [facility]: [address] [place], [place] [address] at  (This time is two hours before your procedure to ensure your preparation). Free valet parking service is available.   Special note: Every effort is made to have your procedure done on time. Please understand that emergencies sometimes delay scheduled procedures.  2. Diet: Do not eat solid foods after midnight.  The patient may have clear liquids until 5am upon the day of the procedure.   3. Medication instructions in preparation for your procedure:  Hold Lasix the morning of your procedure   Hold Glipizide the morning of your procedure   Hold Pioglitazone the morning of your procedure   Take 1/2 a dose of your insulin the night before your procedure but do not take any the day of your procedure  On the morning of your procedure, take your Aspirin and Plavix/Clopidogrel and any morning medicines NOT listed above.  You may use sips of water.  5. Plan for one night stay--bring personal belongings. 6. Bring a current list of your medications and current insurance cards. 7. You MUST have a responsible person to drive you home. 8. Someone MUST be  with you the first 24 hours after you arrive home or your discharge will be delayed. 9. Please wear clothes that are easy to get on and off and wear slip-on shoes.  Thank you for allowing [place] to care for you!   -- [facility]

## 2021-12-22 LAB — BASIC METABOLIC PANEL
BUN/Creatinine Ratio: 28 — ABNORMAL HIGH (ref 10–24)
BUN: 27 mg/dL (ref 8–27)
CO2: 23 mmol/L (ref 20–29)
Calcium: 9.9 mg/dL (ref 8.6–10.2)
Chloride: 102 mmol/L (ref 96–106)
Creatinine, Ser: 0.96 mg/dL (ref 0.76–1.27)
Glucose: 139 mg/dL — ABNORMAL HIGH (ref 70–99)
Potassium: 4.4 mmol/L (ref 3.5–5.2)
Sodium: 142 mmol/L (ref 134–144)
eGFR: 82 mL/min/{1.73_m2} (ref >59)

## 2021-12-22 LAB — CBC
Hematocrit: 38.1 % (ref 37.5–51.0)
Hemoglobin: 13.3 g/dL (ref 13.0–17.7)
MCH: 31.1 pg (ref 26.6–33.0)
MCHC: 34.9 g/dL (ref 31.5–35.7)
MCV: 89 fL (ref 79–97)
Platelets: 177 10*3/uL (ref 150–450)
RBC: 4.27 x10E6/uL (ref 4.14–5.80)
RDW: 12.4 % (ref 11.6–15.4)
WBC: 5.5 10*3/uL (ref 3.4–10.8)

## 2021-12-25 ENCOUNTER — Encounter: Payer: Self-pay | Admitting: Cardiovascular Disease

## 2022-01-07 ENCOUNTER — Telehealth: Payer: Self-pay

## 2022-01-07 NOTE — Telephone Encounter (Signed)
NOTES SCANNED TO REFERRAL RJ 

## 2022-01-08 ENCOUNTER — Telehealth: Payer: Self-pay

## 2022-01-08 NOTE — Telephone Encounter (Signed)
Spoke with Ms. Miguel Garcia, Environmental education officer for medical appointments at [facility] where patient is incarcerated.  The first available time for day to schedule is 02/01/2022.  Scheduled the patient for [facility] 2/10 at 1030 with 0830 arrival time. Instruction letter faxed to Ms. Herrick at [phone].

## 2022-01-09 NOTE — Telephone Encounter (Signed)
Spoke with Miguel Garcia after receiving message she was calling to schedule 3 week visit.   Reiterated to her that Dr. Burt Knack will see the patient the morning of his heart catheterization on 02/01/2022 and no other follow-up is needed at this time.  She understands follow-up will be determined day of heart cath and she will be called to discuss the plan.  Spoke with Art therapist at [facility]. She states she will get the patient scheduled with the dentist next week.

## 2022-01-30 ENCOUNTER — Telehealth: Payer: Self-pay | Admitting: *Deleted

## 2022-01-30 NOTE — Telephone Encounter (Addendum)
I spoke with Miguel Garcia, Medical Department, [facility], [place] ,[place]. Ms Miguel Garcia reports she has copy 01/08/22 Cardiac Cath instruction letter that includes medication instructions and did not have any questions. Cardiac Cath instruction letter does not include taking aspirin 81 mg morning of cath, Ms Miguel Garcia reports usually takes 3 days to get medications, Ms Miguel Garcia aware aspirin 81 mg can be given to patient on arrival to Short Stay 02/01/22. Ms Miguel Garcia confirms 8 AM arrival time [facility] 02/01/22.

## 2022-01-31 ENCOUNTER — Telehealth: Payer: Self-pay

## 2022-02-01 ENCOUNTER — Encounter (HOSPITAL_COMMUNITY)
Admission: RE | Disposition: A | Discharge status: Home / Self Care | Payer: Self-pay | Source: Home / Self Care | Attending: Cardiovascular Disease

## 2022-02-01 ENCOUNTER — Encounter (HOSPITAL_COMMUNITY): Payer: Self-pay | Admitting: Cardiovascular Disease

## 2022-02-01 ENCOUNTER — Other Ambulatory Visit: Payer: Self-pay

## 2022-02-01 ENCOUNTER — Ambulatory Visit (HOSPITAL_COMMUNITY)
Admission: RE | Admit: 2022-02-01 | Discharge: 2022-02-01 | Disposition: A | Discharge status: Home / Self Care | Attending: Cardiovascular Disease | Admitting: Cardiovascular Disease

## 2022-02-01 ENCOUNTER — Telehealth: Payer: Self-pay

## 2022-02-01 DIAGNOSIS — Y832 Surgical operation with anastomosis, bypass or graft as the cause of abnormal reaction of the patient, or of later complication, without mention of misadventure at the time of the procedure: Secondary | ICD-10-CM | POA: Diagnosis not present

## 2022-02-01 DIAGNOSIS — Z87891 Personal history of nicotine dependence: Secondary | ICD-10-CM | POA: Diagnosis not present

## 2022-02-01 DIAGNOSIS — I350 Nonrheumatic aortic (valve) stenosis: Secondary | ICD-10-CM | POA: Diagnosis present

## 2022-02-01 DIAGNOSIS — T82855A Stenosis of coronary artery stent, initial encounter: Secondary | ICD-10-CM | POA: Insufficient documentation

## 2022-02-01 DIAGNOSIS — I252 Old myocardial infarction: Secondary | ICD-10-CM | POA: Diagnosis not present

## 2022-02-01 DIAGNOSIS — I2510 Atherosclerotic heart disease of native coronary artery without angina pectoris: Secondary | ICD-10-CM | POA: Diagnosis not present

## 2022-02-01 DIAGNOSIS — Z955 Presence of coronary angioplasty implant and graft: Secondary | ICD-10-CM | POA: Insufficient documentation

## 2022-02-01 DIAGNOSIS — I340 Nonrheumatic mitral (valve) insufficiency: Secondary | ICD-10-CM

## 2022-02-01 DIAGNOSIS — I272 Pulmonary hypertension, unspecified: Secondary | ICD-10-CM | POA: Insufficient documentation

## 2022-02-01 HISTORY — PX: RIGHT/LEFT HEART CATH AND CORONARY/GRAFT ANGIOGRAPHY: CATH118267

## 2022-02-01 LAB — POCT I-STAT 7, (LYTES, BLD GAS, ICA,H+H)
Acid-Base Excess: 1 mmol/L (ref 0.0–2.0)
Bicarbonate: 26.8 mmol/L (ref 20.0–28.0)
Calcium, Ion: 1.31 mmol/L (ref 1.15–1.40)
HCT: 36 % — ABNORMAL LOW (ref 39.0–52.0)
Hemoglobin: 12.2 g/dL — ABNORMAL LOW (ref 13.0–17.0)
O2 Saturation: 100 %
Potassium: 3.8 mmol/L (ref 3.5–5.1)
Sodium: 140 mmol/L (ref 135–145)
TCO2: 28 mmol/L (ref 22–32)
pCO2 arterial: 47.6 mmHg (ref 32.0–48.0)
pH, Arterial: 7.359 (ref 7.350–7.450)
pO2, Arterial: 184 mmHg — ABNORMAL HIGH (ref 83.0–108.0)

## 2022-02-01 LAB — POCT I-STAT EG7
Acid-Base Excess: 2 mmol/L (ref 0.0–2.0)
Bicarbonate: 28 mmol/L (ref 20.0–28.0)
Calcium, Ion: 1.31 mmol/L (ref 1.15–1.40)
HCT: 36 % — ABNORMAL LOW (ref 39.0–52.0)
Hemoglobin: 12.2 g/dL — ABNORMAL LOW (ref 13.0–17.0)
O2 Saturation: 74 %
Potassium: 3.7 mmol/L (ref 3.5–5.1)
Sodium: 140 mmol/L (ref 135–145)
TCO2: 30 mmol/L (ref 22–32)
pCO2, Ven: 51 mmHg (ref 44.0–60.0)
pH, Ven: 7.347 (ref 7.250–7.430)
pO2, Ven: 42 mmHg (ref 32.0–45.0)

## 2022-02-01 LAB — CBC
HCT: 39.1 % (ref 39.0–52.0)
Hemoglobin: 13.9 g/dL (ref 13.0–17.0)
MCH: 32 pg (ref 26.0–34.0)
MCHC: 35.5 g/dL (ref 30.0–36.0)
MCV: 90.1 fL (ref 80.0–100.0)
Platelets: 172 10*3/uL (ref 150–400)
RBC: 4.34 MIL/uL (ref 4.22–5.81)
RDW: 12.9 % (ref 11.5–15.5)
WBC: 5.8 10*3/uL (ref 4.0–10.5)
nRBC: 0 % (ref 0.0–0.2)

## 2022-02-01 LAB — GLUCOSE, CAPILLARY
Glucose-Capillary: 126 mg/dL — ABNORMAL HIGH (ref 70–99)
Glucose-Capillary: 99 mg/dL (ref 70–99)

## 2022-02-01 LAB — BASIC METABOLIC PANEL
Anion gap: 10 (ref 5–15)
BUN: 23 mg/dL (ref 8–23)
CO2: 24 mmol/L (ref 22–32)
Calcium: 9.7 mg/dL (ref 8.9–10.3)
Chloride: 102 mmol/L (ref 98–111)
Creatinine, Ser: 1.21 mg/dL (ref 0.61–1.24)
GFR, Estimated: >60 mL/min (ref >60)
Glucose, Bld: 116 mg/dL — ABNORMAL HIGH (ref 70–99)
Potassium: 4.2 mmol/L (ref 3.5–5.1)
Sodium: 136 mmol/L (ref 135–145)

## 2022-02-01 SURGERY — RIGHT/LEFT HEART CATH AND CORONARY/GRAFT ANGIOGRAPHY
Anesthesia: LOCAL

## 2022-02-01 MED ORDER — FENTANYL CITRATE (PF) 100 MCG/2ML IJ SOLN
INTRAMUSCULAR | Status: AC
  Filled 2022-02-01: qty 2

## 2022-02-01 MED ORDER — SODIUM CHLORIDE 0.9 % WEIGHT BASED INFUSION
3.0000 mL/kg/h | INTRAVENOUS | Status: AC
  Administered 2022-02-01: 3 mL/kg/h via INTRAVENOUS

## 2022-02-01 MED ORDER — HEPARIN (PORCINE) IN NACL 1000-0.9 UT/500ML-% IV SOLN
INTRAVENOUS | Status: DC | PRN
  Administered 2022-02-01 (×2): 500 mL

## 2022-02-01 MED ORDER — LIDOCAINE HCL (PF) 1 % IJ SOLN
INTRAMUSCULAR | Status: AC
  Filled 2022-02-01: qty 30

## 2022-02-01 MED ORDER — ACETAMINOPHEN 325 MG PO TABS: 650.0000 mg | ORAL_TABLET | ORAL | Status: DC | PRN

## 2022-02-01 MED ORDER — SODIUM CHLORIDE 0.9 % IV SOLN: 250.0000 mL | INTRAVENOUS | Status: DC | PRN

## 2022-02-01 MED ORDER — SODIUM CHLORIDE 0.9% FLUSH: 3.0000 mL | INTRAVENOUS | Status: DC | PRN

## 2022-02-01 MED ORDER — ONDANSETRON HCL 4 MG/2ML IJ SOLN: 4.0000 mg | Freq: Four times a day (QID) | INTRAMUSCULAR | Status: DC | PRN

## 2022-02-01 MED ORDER — ASPIRIN 81 MG PO CHEW
81.0000 mg | CHEWABLE_TABLET | ORAL | Status: AC
  Administered 2022-02-01: 81 mg via ORAL
  Filled 2022-02-01: qty 1

## 2022-02-01 MED ORDER — LIDOCAINE HCL (PF) 1 % IJ SOLN
INTRAMUSCULAR | Status: DC | PRN
  Administered 2022-02-01: 15 mL

## 2022-02-01 MED ORDER — HYDRALAZINE HCL 20 MG/ML IJ SOLN: 10.0000 mg | INTRAMUSCULAR | Status: DC | PRN

## 2022-02-01 MED ORDER — SODIUM CHLORIDE 0.9% FLUSH: 3.0000 mL | Freq: Two times a day (BID) | INTRAVENOUS | Status: DC

## 2022-02-01 MED ORDER — SODIUM CHLORIDE 0.9 % WEIGHT BASED INFUSION: 1.0000 mL/kg/h | INTRAVENOUS | Status: DC

## 2022-02-01 MED ORDER — MIDAZOLAM HCL 2 MG/2ML IJ SOLN
INTRAMUSCULAR | Status: DC | PRN
  Administered 2022-02-01: 2 mg via INTRAVENOUS

## 2022-02-01 MED ORDER — FENTANYL CITRATE (PF) 100 MCG/2ML IJ SOLN
INTRAMUSCULAR | Status: DC | PRN
  Administered 2022-02-01: 25 ug via INTRAVENOUS

## 2022-02-01 MED ORDER — HEPARIN (PORCINE) IN NACL 1000-0.9 UT/500ML-% IV SOLN
INTRAVENOUS | Status: AC
  Filled 2022-02-01: qty 1000

## 2022-02-01 MED ORDER — LABETALOL HCL 5 MG/ML IV SOLN: 10.0000 mg | INTRAVENOUS | Status: DC | PRN

## 2022-02-01 MED ORDER — IOHEXOL 350 MG/ML SOLN
INTRAVENOUS | Status: DC | PRN
  Administered 2022-02-01: 45 mL

## 2022-02-01 MED ORDER — CLOPIDOGREL BISULFATE 75 MG PO TABS: 75.0000 mg | ORAL_TABLET | ORAL | Status: DC

## 2022-02-01 MED ORDER — MIDAZOLAM HCL 2 MG/2ML IJ SOLN
INTRAMUSCULAR | Status: AC
  Filled 2022-02-01: qty 2

## 2022-02-01 SURGICAL SUPPLY — 14 items
CATH INFINITI 5 FR AL2 (CATHETERS) ×1 IMPLANT
CATH INFINITI 5FR MULTPACK ANG (CATHETERS) ×1 IMPLANT
CATH SWAN GANZ 7F STRAIGHT (CATHETERS) ×1 IMPLANT
CLOSURE MYNX CONTROL 5F (Vascular Products) ×1 IMPLANT
CLOSURE MYNX CONTROL 6F/7F (Vascular Products) ×1 IMPLANT
KIT HEART LEFT (KITS) ×2 IMPLANT
KIT MICROPUNCTURE NIT STIFF (SHEATH) ×1 IMPLANT
PACK CARDIAC CATHETERIZATION (CUSTOM PROCEDURE TRAY) ×2 IMPLANT
SHEATH PINNACLE 5F 10CM (SHEATH) ×1 IMPLANT
SHEATH PINNACLE 7F 10CM (SHEATH) ×1 IMPLANT
TRANSDUCER W/STOPCOCK (MISCELLANEOUS) ×2 IMPLANT
TUBING CIL FLEX 10 FLL-RA (TUBING) ×2 IMPLANT
WIRE EMERALD 3MM-J .035X150CM (WIRE) ×1 IMPLANT
WIRE EMERALD ST .035X260CM (WIRE) ×1 IMPLANT

## 2022-02-01 NOTE — Progress Notes (Signed)
Up and walked and tolerated well; right groin stable, no bleeding or hematoma 

## 2022-02-01 NOTE — H&P (Signed)
Cardiology Office Note:     Date:  12/25/2021    ID:  Miguel Garcia, DOB 29-Dec-1945, MRN [id number]   PCP:  Patient, No Pcp Per (Inactive)              [facility] Prov[facility] Cardiologist:  Shelva Majestic, MD      Referring MD: No ref. provider found       Chief Complaint  Patient presents with   Shortness of Breath      History of Present Illness:     Miguel Garcia is a 76 y.o. male referred by Dr Claiborne Billings for evaluation of aortic stenosis and mitral regurgitation.    The patient is currently incarcerated in [facility].  He is here with a Curator today.  He has been followed by Dr. Claiborne Billings for many years.  He has a longstanding history of ischemic heart disease and ultimately was treated with multivessel CABG about 15 years ago.  He has undergone multiple PCI procedures in the interim.  His last heart catheterization was in 2019.  He currently is not having much trouble with angina.  However, he has developed progressive exertional dyspnea and is now short of breath with walking on level ground at relatively short distances of about 50 yards.  Imaging studies have demonstrated progression of aortic stenosis and have raise suspicion of severe eccentric mitral regurgitation.  He ultimately underwent a transesophageal echocardiogram in September 2022 demonstrating normal biventricular function, but confirming severe eccentric mitral regurgitation with a flail segment of the P2 scallop of the mitral valve.  There is a question of an area of healed endocarditis with a mobile echodensity on the atrial surface of the mitral valve.  There is systolic pulmonary vein flow reversal noted, confirming severe mitral regurgitation.  In addition, the aortic valve is demonstrated to be severely restricted and calcified with a mean transvalvular gradient of 31 mmHg and calculated aortic valve area of 0.7 cm with a dimensionless index of 0.2.  He also is shown to have mild aortic  insufficiency.  The patient denies symptoms of chest pain, chest pressure, orthopnea, PND, or leg edema.  He primarily exhibits symptoms of shortness of breath and fatigue.  He also notes a history of poor dentition.  He has had multiple teeth extracted and continues to undergo staged extractions of 2-3 teeth at a time.       Past Medical History:  Diagnosis Date   Anxiety     Arthritis      "hands, lower back,; L4-5;  neck; C1-2" (03/31/2018)   Asbestosis ([place])      "of my lungs"   Asthma     CAD (coronary artery disease)      CABG 14 days ago   CAD S/P percutaneous coronary angioplasty 03/31/2018    LM PCI   Chronic lower back pain     Heart murmur     High cholesterol     Hypertension     Myocardial infarction [facility]) 2006; 2007; 2012   Prostate cancer [facility])      "put 3 beamers in my prostate in 11/2017 & radiation completed in 02/2018" (03/31/2018)   Type II diabetes mellitus ([place])             Past Surgical History:  Procedure Laterality Date   BACK SURGERY       BUBBLE STUDY   08/23/2021    Procedure: BUBBLE STUDY;  Surgeon: [name], MD;  Location: [facility] END[facility]COPY;  Service: Cardiology;;  CORONARY ANGIOPLASTY WITH STENT PLACEMENT   03/31/2018   CORONARY ARTERY BYPASS GRAFT   2007   CORONARY BALLOON ANGIOPLASTY N/A 08/17/2018    Procedure: CORONARY BALLOON ANGIOPLASTY;  Surgeon: Troy Sine, MD;  Location: [place] CV LAB;  Service: Cardiovascular;  Laterality: N/A;   CORONARY STENT INTERVENTION N/A 03/31/2018    Procedure: CORONARY STENT INTERVENTION;  Surgeon: Troy Sine, MD;  Location: [place] CV LAB;  Service: Cardiovascular;  Laterality: N/A;   CORONARY STENT INTERVENTION N/A 08/17/2018    Procedure: CORONARY STENT INTERVENTION;  Surgeon: Troy Sine, MD;  Location: [place] CV LAB;  Service: Cardiovascular;  Laterality: N/A;   FOREARM FRACTURE SURGERY Right     FRACTURE SURGERY       HEMORRHOID BANDING       LAPAROSCOPIC CHOLECYSTECTOMY        LEFT HEART CATH AND CORONARY ANGIOGRAPHY N/A 03/31/2018    Procedure: LEFT HEART CATH AND CORONARY ANGIOGRAPHY;  Surgeon: Troy Sine, MD;  Location: [place] CV LAB;  Service: Cardiovascular;  Laterality: N/A;   LEFT HEART CATH AND CORS/GRAFTS ANGIOGRAPHY N/A 08/17/2018    Procedure: LEFT HEART CATH AND CORS/GRAFTS ANGIOGRAPHY;  Surgeon: Troy Sine, MD;  Location: [place] CV LAB;  Service: Cardiovascular;  Laterality: N/A;   ORIF SHOULDER FRACTURE Left     POSTERIOR FUSION LUMBAR SPINE        L4-5   PROSTATE BIOPSY        "put in 3 [employer]ponder [employer]plants in 11/2017"   TEE WITHOUT CARDIOVERSION N/A 08/23/2021    Procedure: TRANSESOPHAGEAL ECHOCARDIOGRAM (TEE);  Surgeon: [name], MD;  Location: [place];  Service: Cardiology;  Laterality: N/A;      Current Medications: Active Medications      Current Meds  Medication Sig   acetaminophen (TYLENOL) 325 MG tablet Take 650 mg by mouth 3 (three) times daily as needed for moderate pain.   albuterol (PROVENTIL HFA;VENTOLIN HFA) 108 (90 Base) MCG/ACT inhaler Inhale 2 puffs into the lungs 4 (four) times daily as needed for wheezing or shortness of breath.   albuterol (PROVENTIL) (2.5 MG/3ML) 0.083% nebulizer solution Take 2.5 mg by nebulization every 6 (six) hours as needed for wheezing or shortness of breath.   Calcium Citrate-Vitamin D 315-250 MG-UNIT TABS Take 2 tablets by mouth in the morning and at bedtime.   carvedilol (COREG) 6.25 MG tablet Take 6.25 mg by mouth 2 (two) times daily.   clopidogrel (PLAVIX) 75 MG tablet Take 75 mg by mouth daily.   Dextran 70-Hypromellose (ARTIFICIAL TEARS) 0.1-0.3 % SOLN Place 1 drop into both eyes 4 (four) times daily as needed (burning).   docusate sodium (COLACE) 100 MG capsule Take 100 mg by mouth 2 (two) times daily.   DULoxetine (CYMBALTA) 20 MG capsule Take 20 mg by mouth at bedtime.   enzalutamide (XTANDI) 40 MG capsule Take 160 mg by mouth daily.   furosemide (LASIX) 20  MG tablet Take 20 mg every other day   gabapentin (NEURONTIN) 300 MG capsule Take 600 mg by mouth 3 (three) times daily.    insulin glargine (LANTUS) 100 UNIT/ML injection Inject into the skin.   isosorbide mononitrate (IMDUR) 30 MG 24 hr tablet Take 15 mg by mouth daily.   meclizine (ANTIVERT) 25 MG tablet Take 25 mg by mouth 3 (three) times daily as needed for dizziness.   nitroGLYCERIN (NITROSTAT) 0.4 MG SL tablet Place 1 tablet (0.4 mg total) under the tongue every 5 (  five) minutes as needed for chest pain.   OVER THE COUNTER MEDICATION Take 1 capsule by mouth in the morning and at bedtime. FE/B12/C/FA/SA   pantoprazole (PROTONIX) 20 MG tablet Take 1 tablet (20 mg total) by mouth 2 (two) times daily.   pioglitazone (ACTOS) 15 MG tablet Take 15 mg by mouth daily.   rosuvastatin (CRESTOR) 20 MG tablet Take 20 mg by mouth daily.   tamsulosin (FLOMAX) 0.4 MG CAPS capsule Take 0.4 mg by mouth 2 (two) times daily.   Vitamin D, Ergocalciferol, (DRISDOL) 1.25 MG (50000 UNIT) CAPS capsule Take 50,000 Units by mouth every 14 (fourteen) days.        Allergies:   Lisinopril    Social History         Socioeconomic History   Marital status: Divorced      Spouse name: Not on file   Number of children: Not on file   Years of education: Not on file   Highest education level: Not on file  Occupational History   Not on file  Tobacco Use   Smoking status: Never   Smokeless tobacco: Former      Types: Nurse, children's Use: Never used  Substance and Sexual Activity   Alcohol use: Not Currently   Drug use: Not Currently   Sexual activity: Not Currently  Other Topics Concern   Not on file  Social History Narrative   Not on file    Social Determinants of Health    Financial Resource Strain: Not on file  Food Insecurity: Not on file  Transportation Needs: Not on file  Physical Activity: Not on file  Stress: Not on file  Social Connections: Not on file      Family History: The  patient's family history includes Hypertension in his mother.   ROS:   Please see the history of present illness.    All other systems reviewed and are negative.   EKGs/Labs/Other Studies Reviewed:     The following studies were reviewed today: TEE: 1. Left ventricular ejection fraction, by estimation, is 55 to 60%. The  left ventricle has normal function. The left ventricle demonstrates  regional wall motion abnormalities (see scoring diagram/findings for  description).   2. Right ventricular systolic function is normal. The right ventricular  size is normal.   3. Left atrial size was severely dilated. No left atrial/left atrial  appendage thrombus was detected. The LAA emptying velocity was 61 cm/s.   4. Right atrial size was mildly dilated.   5. Severe, eccentric mitral valve regurgitation. There is a flail segment  on the posterior mitral leaflet on the P2 scallop with degenerative  change/calcification. There is also a small independently mobile lesion on  the atrial surface of the mitral  valve. Cannot exclude degenerative change from prior endocarditis (best  seen in clip 68). Severe and eccentric anteriorly direct jet of mitral  valve regurgitation. PV blunting and systolic reversal noted. The mitral  valve is abnormal. Severe mitral valve   regurgitation. No evidence of mitral stenosis.   6. The aortic valve is abnormal. There is severe calcifcation of the  aortic valve. Aortic valve regurgitation is mild. Moderate to severe  aortic valve stenosis. Aortic valve area, by VTI measures 0.70 cm. Aortic  valve mean gradient measures 30.5 mmHg.  Aortic valve Vmax measures 3.55 m/s.   7. Cannot exclude small PFO with left to right shunt by color flow..  Agitated saline contrast bubble study was  negative, with no evidence of  right to left shunt.   8. There is Moderate (Grade III) atheroma plaque involving the transverse  and descending aorta.    Conclusion(s)/Recommendation(s): Consider inflammatory markers and blood  cultures to exclude endocarditis.    LEFT VENTRICLE  PLAX 2D  LVOT diam:     2.10 cm  LV SV:         57  LV SV Index:   28  LVOT Area:     3.46 cm      AORTIC VALVE  AV Area (Vmax):    0.73 cm  AV Area (Vmean):   0.69 cm  AV Area (VTI):     0.70 cm  AV Vmax:           355.00 cm/s  AV Vmean:          262.000 cm/s  AV VTI:            0.812 m  AV Peak Grad:      50.4 mmHg  AV Mean Grad:      30.5 mmHg  LVOT Vmax:         74.86 cm/s  LVOT Vmean:        52.164 cm/s  LVOT VTI:          0.164 m  LVOT/AV VTI ratio: 0.20     AORTA  Ao Root diam: 3.30 cm  Ao Asc diam:  3.50 cm   MR Peak grad:    154.4 mmHg  MR Mean grad:    81.0 mmHg   SHUNTS  MR Vmax:         621.26 cm/s Systemic VTI:  0.16 m  MR Vmean:        442.9 cm/s  Systemic Diam: 2.10 cm  MR PISA Nyquist: 3.9 m/s  MR PISA:         9.05 cm  MR PISA Eff ROA: 56 mm  MR PISA Radius:  1.20 cm    Echo: 1. Left ventricular ejection fraction, by estimation, is 55 to 60%. The  left ventricle has normal function. The left ventricle demonstrates  regional wall motion abnormalities (see scoring diagram/findings for  description). There is mild left ventricular   hypertrophy of the basal-septal segment. Left ventricular diastolic  parameters are consistent with Grade II diastolic dysfunction  (pseudonormalization). Elevated left atrial pressure. There is moderate  hypokinesis of the left ventricular, basal-mid  inferolateral wall.   2. Right ventricular systolic function is normal. The right ventricular  size is normal. There is normal pulmonary artery systolic pressure.   3. Left atrial size was severely dilated.   4. The mitral valve is myxomatous. Moderate to severe mitral valve  regurgitation. No evidence of mitral stenosis. There is mild holosystolic  prolapse of the middle scallop of the posterior leaflet of the mitral  valve.   5. The  aortic valve is tricuspid. There is severe calcification of the  aortic valve. There is severe thickening of the aortic valve. Aortic valve  regurgitation is mild. Moderate aortic valve stenosis. The aortic valve  area and the dimensionless index  overestimate the severity of aortic stenosis due to erroneous placement of  the LVOT pulsed Doppler sample volume.   6. The inferior vena cava is normal in size with greater than 50%  respiratory variability, suggesting right atrial pressure of 3 mmHg.   Comparison(s): Prior images reviewed side by side. The left ventricular  function is unchanged. The left ventricular wall motion  abnormality is  unchanged. Aortic stenosis has worsened. Mitral insufficiency was probably  underestimated on the previous echo,   but has also probably worsened.    Cardiac Cath 2019: Conclusion     Ost LAD to Prox LAD lesion is 100% stenosed. 1st Diag lesion is 50% stenosed. Prox LAD to Mid LAD lesion is 10% stenosed. Ost 3rd Mrg lesion is 40% stenosed. Previously placed Ost Cx to Prox Cx stent (unknown type) is widely patent. Previously placed Prox Cx stent (unknown type) is widely patent. Prox RCA lesion is 35% stenosed. Mid RCA lesion is 30% stenosed. Mid LM to Dist LM lesion is 80% stenosed. Prox Cx to Mid Cx lesion is 15% stenosed. A stent was successfully placed. Post intervention, there is a 0% residual stenosis. Mid Cx to Dist Cx lesion is 90% stenosed. Post intervention, there is a 0% residual stenosis. Ost 2nd Mrg lesion is 85% stenosed. Post intervention, there is a 10% residual stenosis. Post intervention, there is a 50% residual stenosis.   Significant of coronary obstructive disease with 80% in-stent restenosis in the distal left main stent.   Old total ostial occlusion of the LAD.   Patent proximal left circumflex stent with 15% intimal hyperplasia in the mid circumflex stent followed by eccentric 90% stenosis distal to the mid circumflex  stent and 85% ostial stenosis in the OM1 vessel which is jailed by stent overlap of the proximal and mid  previously placed stents.   Dominant RCA with 35% smooth stenosis proximally and 30% stenosis in the mid segment.   Right and left internal mammary artery supplying the mid LAD with 50% narrowing in the diagonal vessel and patent proximal LAD stent.   Successful multilesion intervention with Cutting Balloon/PTCA of the distal left main stent with the 80% stenosis being reduced to less than 15%; successful stenting of the distal circumflex with insertion of a 2.0 x 12 mm Resolute DES stent postdilated to 2.25 mm with a 90% stenosis being reduced to 0%, and PTCA of the ostium of the OM1 vessel eccentric 85% stenosis reduced  to 50%.   RECOMMENDATION: Medical therapy for concomitant CAD.  High potency statin therapy with target LDL less than 70.  Optimal blood pressure control.  Recommend follow-up 2D echo Doppler study to reassess LV function. Recommend dual antiplatelet therapy with ASA 81 mg and Ticagrelor 90mg  twice daily long-term (beyond 12 months) because of multiple stents and left main involvement in this patient status post prior CABG revascularization. Diagnostic Dominance: Right Intervention       Recent Labs: 12/21/2021: BUN 27; Creatinine, Ser 0.96; Hemoglobin 13.3; Platelets 177; Potassium 4.4; Sodium 142  Recent Lipid Panel Labs (Brief)          Component Value Date/Time    CHOL 119 08/15/2018 0632    TRIG 80 08/15/2018 0632    HDL 34 (L) 08/15/2018 0632    CHOLHDL 3.5 08/15/2018 0632    VLDL 16 08/15/2018 0632    LDLCALC 69 08/15/2018 0632          Risk Assessment/Calculations:     STS RIsk Calculator: Isolated AVR: Risk of Mortality: 2.321% Renal Failure: 3.165% Permanent Stroke: 2.219% Prolonged Ventilation: 8.026% DSW Infection: 0.159% Reoperation: 5.724% Morbidity or Mortality: 14.059% Short Length of Stay: 31.585% Long Length of  Stay: 5.939%   Mitral Valve Repair: Risk of Mortality: 2.358% Renal Failure: 2.311% Permanent Stroke: 4.130% Prolonged Ventilation: 7.665% DSW Infection: 0.094% Reoperation: 4.147% Morbidity or Mortality: 11.872% Short Length of Stay: 37.918% Long  Length of Stay: 6.339%   Mitral Valve Replacement: Risk of Mortality: 4.613% Renal Failure: 4.698% Permanent Stroke: 3.195% Prolonged Ventilation: 14.017% DSW Infection: 0.180% Reoperation: 7.341% Morbidity or Mortality: 17.749% Short Length of Stay: 19.973% Long Length of Stay: 8.388%       Physical Exam:     VS:  BP 102/78    Pulse 78    Ht 5\' 9"  (1.753 m)    Wt 195 lb (88.5 kg)    SpO2 97%    BMI 28.80 kg/m         Wt Readings from Last 3 Encounters:  12/21/21 195 lb (88.5 kg)  11/05/21 194 lb 9.6 oz (88.3 kg)  08/23/21 193 lb (87.5 kg)      GEN:  Well nourished, well developed in no acute distress HEENT: Poor dentition with multiple missing teeth NECK: No JVD; No carotid bruits LYMPHATICS: No lymphadenopathy CARDIAC: RRR, 3/6 holosystolic murmur at the apex, 2/6 harsh crescendo decrescendo murmur at the right upper sternal border RESPIRATORY:  Clear to auscultation without rales, wheezing or rhonchi  ABDOMEN: Soft, non-tender, non-distended MUSCULOSKELETAL:  No edema; No deformity  SKIN: Warm and dry NEUROLOGIC:  Alert and oriented x 3 PSYCHIATRIC:  Normal affect    ASSESSMENT:     1. Nonrheumatic mitral (valve) insufficiency   2. Nonrheumatic aortic valve stenosis   3. Coronary artery disease involving native coronary artery of native heart without angina pectoris     PLAN:     In order of problems listed above:   The patient has New York Heart Association functional class III symptoms.  He can walk about 50 yards and then he develops dyspnea and fatigue.  I personally reviewed his transesophageal echo images which demonstrate a flail segment of P2 with severe eccentric mitral regurgitation  confirmed by pulmonary vein flow reversal.  There is a mobile echodensity on the atrial surface of the mitral valve which could be related to healed endocarditis.  We reviewed the natural history of primary mitral regurgitation today.  His situation is complicated by the presence of extensive ischemic heart disease and significant aortic stenosis.  I am not sure that he would be a candidate for redo cardiac surgery with aortic valve replacement and mitral valve repair.  However, he might be a candidate for transcatheter edge-to-edge repair of the mitral valve and TAVR.  See discussion below.  Further diagnostic testing is necessary.  I have recommended right and left heart catheterization to assess coronary anatomy and cardiac hemodynamics. I have reviewed the risks, indications, and alternatives to cardiac catheterization, possible angioplasty, and stenting with the patient. Risks include but are not limited to bleeding, infection, vascular injury, stroke, myocardial infection, arrhythmia, kidney injury, radiation-related injury in the case of prolonged fluoroscopy use, emergency cardiac surgery, and death. The patient understands the risks of serious complication is 1-2 in 0258 with diagnostic cardiac cath and 1-2% or less with angioplasty/stenting.  The patient's TEE images show a severely restricted aortic valve with Doppler data suggestive of severe aortic stenosis.  Cardiac catheterization will further confirm the invasive hemodynamics and severity of his aortic stenosis.  I have recommended CTA studies of the heart as well as the chest, abdomen, and pelvis to evaluate anatomic feasibility of TAVR.  Aortic valve calcium score will be measured as well.  Once his imaging studies are completed, he will be referred for formal cardiac surgical consultation as part of a multidisciplinary heart team evaluation. The patient does not appear to have any  active anginal symptoms.  He has undergone multiple PCI  procedures and ultimately was treated with multivessel CABG.      Shared Decision Making/Informed Consent The risks [stroke (1 in 1000), death (1 in 1000), kidney failure [usually temporary] (1 in 500), bleeding (1 in 200), allergic reaction [possibly serious] (1 in 200)], benefits (diagnostic support and management of coronary artery disease) and alternatives of a cardiac catheterization were discussed in detail with Miguel Garcia and he is willing to proceed.   ADDENDUM 02/01/22: The patient is personally interviewed and examined.  He reports no clinical changes since the time of this office evaluation dated December 25, 2021.  He continues to experience shortness of breath with physical activity as well as some lightheadedness and weakness.  He has not had anginal chest pain of late.  He presents today for right and left heart catheterization as part of his evaluation for transcatheter valve therapies to treat aortic stenosis and severe mitral regurgitation.  On my examination today:  Vitals:   02/01/22 0730  BP: 130/66  Pulse: 77  Resp: 16  Temp: 97.6 F (36.4 C)  SpO2: 99%   Pt is alert and oriented, NAD HEENT: normal Neck: JVP - normal, carotids 2+=  Lungs: CTA bilaterally CV: RRR with a prominent 3/6 holosystolic murmur at the left sternal border and apex Abd: soft, NT, Positive BS, no hepatomegaly Ext: no C/C/E, no edema Skin: warm/dry no rash Neuro: Grossly intact with 5/5 strength bilaterally.  Preprocedure labs are reviewed that show a creatinine of 1.21, potassium 4.2, hemoglobin 13.9, and platelet count 172,000.  The patient has severe, symptomatic aortic stenosis and mitral regurgitation with history of prior CABG and multiple PCI procedures.  He presents today for right and left heart catheterization to evaluate hemodynamics, coronary anatomy in the setting of his known ischemic heart disease, and assess treatment options for his severe valvular heart disease.  Procedural  risks, indications, and alternatives have been extensively discussed with him.  He has undergone this procedure many times in the past and he understands the rationale for performing cardiac catheterization.  All of his questions were answered.  Sherren Mocha 02/01/2022 9:20 AM

## 2022-02-01 NOTE — Telephone Encounter (Signed)
Discussed with Dr. Burt Knack. The patient will proceed with TAVR scans at this time. Will plan for TAVR then [facility].   Attempted to call Officer Louis Meckel at [phone] ext: [phone] to arrange TAVR scans for the patient. The phone kept ringing - no way to leave message.  Will try again later.

## 2022-02-01 NOTE — Progress Notes (Signed)
Pt cooperative.  Officer at bedside

## 2022-02-04 NOTE — Telephone Encounter (Signed)
Confirmed with Officer Louis Meckel that the patient will have TAVR scans 02/27/2022 at 34.  Instructions faxed to: [phone]

## 2022-02-27 ENCOUNTER — Encounter (HOSPITAL_COMMUNITY): Payer: Self-pay

## 2022-02-27 ENCOUNTER — Other Ambulatory Visit: Payer: Self-pay

## 2022-02-27 ENCOUNTER — Ambulatory Visit (HOSPITAL_COMMUNITY)
Admission: RE | Admit: 2022-02-27 | Discharge: 2022-02-27 | Disposition: A | Discharge status: Home / Self Care | Source: Ambulatory Visit | Attending: Cardiovascular Disease | Admitting: Cardiovascular Disease

## 2022-02-27 DIAGNOSIS — I350 Nonrheumatic aortic (valve) stenosis: Secondary | ICD-10-CM

## 2022-02-27 MED ORDER — DILTIAZEM HCL 25 MG/5ML IV SOLN: 10.0000 mg | INTRAVENOUS | Status: DC | PRN

## 2022-02-27 MED ORDER — NITROGLYCERIN 0.4 MG SL SUBL: 0.8000 mg | SUBLINGUAL_TABLET | Freq: Once | SUBLINGUAL | Status: DC

## 2022-02-27 MED ORDER — METOPROLOL TARTRATE 5 MG/5ML IV SOLN: 10.0000 mg | INTRAVENOUS | Status: DC | PRN

## 2022-02-27 MED ORDER — METOPROLOL TARTRATE 5 MG/5ML IV SOLN
INTRAVENOUS | Status: AC
  Administered 2022-02-27: 10 mg via INTRAVENOUS
  Filled 2022-02-27: qty 20

## 2022-02-27 MED ORDER — DILTIAZEM HCL 25 MG/5ML IV SOLN
INTRAVENOUS | Status: AC
  Filled 2022-02-27: qty 5

## 2022-02-27 MED ORDER — IOHEXOL 350 MG/ML SOLN
100.0000 mL | Freq: Once | INTRAVENOUS | Status: AC | PRN
  Administered 2022-02-27: 100 mL via INTRAVENOUS

## 2022-02-28 ENCOUNTER — Other Ambulatory Visit: Payer: Self-pay | Admitting: Physician Assistant

## 2022-02-28 DIAGNOSIS — I350 Nonrheumatic aortic (valve) stenosis: Secondary | ICD-10-CM

## 2022-03-08 ENCOUNTER — Telehealth: Payer: Self-pay

## 2022-03-08 NOTE — Telephone Encounter (Signed)
Officer Louis Meckel called to confirm authorization for Dr. Vivi Martens appointment has been approved.  ?She states she would like to schedule the appointment for Dr. Cyndia Bent, pre-admission testing and TAVR. ?She will need supporting documentation in Dr. Vivi Martens note to urgently authorize surgery. ?She understands Ander Purpura will call her Monday to discuss timing of appointment/surgery. ?

## 2022-03-22 NOTE — Telephone Encounter (Signed)
Called to confirm appointment 4/19 with Dr. Cyndia Bent. ?Officer Louis Meckel is out today, will try again Monday. ? ?Phone: [phone] ext [phone] ?

## 2022-03-25 NOTE — Telephone Encounter (Signed)
Spoke with Miguel Garcia. Confirmed appointment for patient with Dr. Cyndia Bent on 04/10/2022. ?Date, time, address confirmed and sent via fax. ?Also informed Louis Meckel of surgery date of 5/2 for planning.  ?She understands she will be called with an arrival time closer to date.  ?She was grateful for assistance. ?

## 2022-04-10 ENCOUNTER — Encounter: Payer: Self-pay | Admitting: Surgery

## 2022-04-10 ENCOUNTER — Institutional Professional Consult (permissible substitution) (INDEPENDENT_AMBULATORY_CARE_PROVIDER_SITE_OTHER): Admitting: Surgery

## 2022-04-10 VITALS — BP 130/74 | HR 83 | Resp 20 | Ht 69.0 in

## 2022-04-10 DIAGNOSIS — I350 Nonrheumatic aortic (valve) stenosis: Secondary | ICD-10-CM | POA: Diagnosis not present

## 2022-04-10 NOTE — Progress Notes (Signed)
Patient ID: Miguel Garcia, male   DOB: 30-May-1946, 76 y.o.   MRN: [id number] ? ?[facility]   ?MULTIDISCIPLINARY HEART VALVE CLINIC ?  ? ? ? ?   ?[address] ?      [place] [address] ?            [phone]   ?      ? ?CARDIOTHORACIC SURGERY CONSULTATION REPORT ? ?PCP is Patient, No Pcp Per (Inactive) ?Referring Provider is Sherren Mocha, MD ?Primary Cardiologist is Shelva Majestic, MD ? ?Reason for consultation:  Severe aortic stenosis and severe mitral regurgitation ? ?HPI: ? ?The patient is a 76 year old inmate with a history of hypertension, hyperlipidemia, type 2 diabetes, myocardial infarction, status post CABG about 15 years ago with multiple subsequent PCI procedures who presents with progressive exertional fatigue and shortness of breath.  He said that he is now having shortness of breath with walking less than 50 yards.  He denies any substernal chest discomfort.  He has had frequent episodes of dizziness but denies syncope.  He does have some peripheral edema.  He denies orthopnea and PND.  An echo in April 2022 showed a trileaflet aortic valve with severe thickening and calcification.  The mean gradient was measured at 26 mmHg with a peak gradient of 45 mmHg.  Valve area was 0.92 cm?.  Dimensionless index was 0.24 with a stroke-volume index of 31.  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  A TEE was done on 08/23/2021 which showed severe calcification of the aortic valve with a mean gradient of 30 mmHg and a valve area of 0.7 cm? by VTI.  There was severe eccentric mitral valve regurgitation with a flail P2 segment of the posterior leaflet.  There was a small mobile lesion on the atrial surface of the mitral valve that was suspicious for possible old endocarditis.  There was pulmonary vein blunting and systolic reversal noted consistent with severe  regurgitation.  Left ventricular ejection fraction was 55 to 60%.  He subsequent underwent cardiac catheterization on 02/01/2022 showing multivessel coronary disease with a patent LIMA to the LAD and continued patency of the left main and left circumflex stents with mild to moderate in-stent restenosis.  There is continued patency of the RCA with mild stenosis.  There is severe aortic stenosis with a mean gradient across the valve of 43 mmHg and a peak to peak gradient of 51 mmHg.  Calculated valve area was 0.77 cm?Marland Kitchen  There was moderate pulmonary hypertension with a PA pressure of 59/22 with a mean of 36 and a large V wave of 24 mmHg consistent with severe mitral regurgitation. ? ?The patient is here accompanied by 2 corrections officers.  He uses a walker for ambulation to help with balance. ? ?Past Medical History:  ?Diagnosis Date  ? Anxiety   ? Arthritis   ? "hands, lower back,; L4-5;  neck; C1-2" (03/31/2018)  ? Asbestosis ([place])   ? "of my lungs"  ? Asthma   ? CAD (coronary artery disease)   ? CABG 14 days ago  ? CAD S/P percutaneous coronary angioplasty 03/31/2018  ? LM PCI  ? Chronic lower back pain   ? Heart murmur   ? High cholesterol   ? Hypertension   ? Myocardial infarction [facility]) 2006; 2007; 2012  ? Prostate cancer ([place])   ? "put 3 beamers in  my prostate in 11/2017 & radiation completed in 02/2018" (03/31/2018)  ? Type II diabetes mellitus ([place])   ? ? ?Past Surgical History:  ?Procedure Laterality Date  ? BACK SURGERY    ? BUBBLE STUDY  08/23/2021  ? Procedure: BUBBLE STUDY;  Surgeon: [name], MD;  Location: [place];  Service: Cardiology;;  ? CORONARY ANGIOPLASTY WITH STENT PLACEMENT  03/31/2018  ? CORONARY ARTERY BYPASS GRAFT  2007  ? CORONARY BALLOON ANGIOPLASTY N/A 08/17/2018  ? Procedure: CORONARY BALLOON ANGIOPLASTY;  Surgeon: Troy Sine, MD;  Location: [place] [facility];  Service: Cardiovascular;  Laterality: N/A;  ? CORONARY STENT INTERVENTION N/A 03/31/2018  ? Procedure: CORONARY STENT  INTERVENTION;  Surgeon: Troy Sine, MD;  Location: [place] [facility];  Service: Cardiovascular;  Laterality: N/A;  ? CORONARY STENT INTERVENTION N/A 08/17/2018  ? Procedure: CORONARY STENT INTERVENTION;  Surgeon: Troy Sine, MD;  Location: [place] [facility];  Service: Cardiovascular;  Laterality: N/A;  ? FOREARM FRACTURE SURGERY Right   ? FRACTURE SURGERY    ? HEMORRHOID BANDING    ? LAPAROSCOPIC CHOLECYSTECTOMY    ? LEFT HEART CATH AND CORONARY ANGIOGRAPHY N/A 03/31/2018  ? Procedure: LEFT HEART CATH AND CORONARY ANGIOGRAPHY;  Surgeon: Troy Sine, MD;  Location: [place] [facility];  Service: Cardiovascular;  Laterality: N/A;  ? LEFT HEART CATH AND CORS/GRAFTS ANGIOGRAPHY N/A 08/17/2018  ? Procedure: LEFT HEART CATH AND CORS/GRAFTS ANGIOGRAPHY;  Surgeon: Troy Sine, MD;  Location: [place] [facility];  Service: Cardiovascular;  Laterality: N/A;  ? ORIF SHOULDER FRACTURE Left   ? POSTERIOR FUSION LUMBAR SPINE    ? L4-5  ? PROSTATE BIOPSY    ? "put in [employer] in 11/2017"  ? RIGHT/LEFT HEART CATH AND CORONARY/GRAFT ANGIOGRAPHY N/A 02/01/2022  ? Procedure: RIGHT/LEFT HEART CATH AND CORONARY/GRAFT ANGIOGRAPHY;  Surgeon: Sherren Mocha, MD;  Location: [place] [facility];  Service: Cardiovascular;  Laterality: N/A;  ? TEE WITHOUT CARDIOVERSION N/A 08/23/2021  ? Procedure: TRANSESOPHAGEAL ECHOCARDIOGRAM (TEE);  Surgeon: [name], MD;  Location: [facility] [place];  Service: Cardiology;  Laterality: N/A;  ? ? ?Family History  ?Problem Relation Age of Onset  ? Hypertension Mother   ? ? ?Social History  ? ?Socioeconomic History  ? Marital status: Divorced  ?  Spouse name: Not on file  ? Number of children: Not on file  ? Years of education: Not on file  ? Highest education level: Not on file  ?Occupational History  ? Not on file  ?Tobacco Use  ? Smoking status: Never  ? Smokeless tobacco: Former  ?  Types: Chew  ?Vaping Use  ? Vaping Use: Never used  ?Substance and Sexual Activity  ?  Alcohol use: Not Currently  ? Drug use: Not Currently  ? Sexual activity: Not Currently  ?Other Topics Concern  ? Not on file  ?Social History Narrative  ? Not on file  ? ?Social Determinants of Health  ? ?Financial Resource Strain: Not on file  ?Food Insecurity: Not on file  ?Transportation Needs: Not on file  ?Physical Activity: Not on file  ?Stress: Not on file  ?Social Connections: Not on file  ?Intimate Partner Violence: Not on file  ? ? ?Prior to Admission medications   ?Medication Sig Start Date End Date Taking? Authorizing Provider  ?acetaminophen (TYLENOL) 325 MG tablet Take 650 mg by mouth 3 (three) times daily as needed for moderate pain.   Yes [provider]  ?  albuterol (PROVENTIL HFA;VENTOLIN HFA) 108 (90 Base) MCG/ACT inhaler Inhale 2 puffs into the lungs 4 (four) times daily as needed for wheezing or shortness of breath.   Yes [provider]  ?albuterol (PROVENTIL) (2.5 MG/3ML) 0.083% nebulizer solution Take 2.5 mg by nebulization every 6 (six) hours as needed for wheezing or shortness of breath.   Yes [provider]  ?Calcium Citrate-Vitamin D 315-250 MG-UNIT TABS Take 2 tablets by mouth in the morning and at bedtime.   Yes [provider]  ?carvedilol (COREG) 6.25 MG tablet Take 6.25 mg by mouth 2 (two) times daily.   Yes [provider]  ?clopidogrel (PLAVIX) 75 MG tablet Take 75 mg by mouth daily.   Yes [provider]  ?Dextran 70-Hypromellose (ARTIFICIAL TEARS) 0.1-0.3 % SOLN Place 1 drop into both eyes 4 (four) times daily as needed (burning).   Yes [provider]  ?docusate sodium (COLACE) 100 MG capsule Take 100 mg by mouth 2 (two) times daily.   Yes [provider]  ?DULoxetine (CYMBALTA) 20 MG capsule Take 20 mg by mouth at bedtime.   Yes [provider]  ?enzalutamide (XTANDI) 40 MG capsule Take 160 mg by mouth daily. 03/06/21  Yes [provider]  ?furosemide (LASIX) 20 MG tablet Take 20 mg every  other day 11/05/21  Yes Troy Sine, MD  ?gabapentin (NEURONTIN) 300 MG capsule Take 600 mg by mouth 3 (three) times daily.    Yes [provider]  ?glipiZIDE (GLUCOTROL XL) 10 MG 24 hr tablet

## 2022-04-10 NOTE — Progress Notes (Signed)
Pre Surgical Assessment: 5 M Walk Test ? ?38M=16.1f ? ?5 Meter Walk Test- trial 1: 10.56 seconds ?5 Meter Walk Test- trial 2: 12.70 seconds ?5 Meter Walk Test- trial 3: 8.67 seconds ?5 Meter Walk Test Average: 10.64 seconds ?  ?

## 2022-04-12 ENCOUNTER — Encounter: Payer: Self-pay | Admitting: Surgery

## 2022-04-16 ENCOUNTER — Other Ambulatory Visit: Payer: Self-pay

## 2022-04-16 DIAGNOSIS — I350 Nonrheumatic aortic (valve) stenosis: Secondary | ICD-10-CM

## 2022-04-22 MED ORDER — DEXMEDETOMIDINE HCL IN NACL 400 MCG/100ML IV SOLN
0.1000 ug/kg/h | INTRAVENOUS | Status: AC
  Administered 2022-04-23: 1 ug/kg/h via INTRAVENOUS
  Filled 2022-04-22 (×2): qty 100

## 2022-04-22 MED ORDER — MAGNESIUM SULFATE 50 % IJ SOLN
40.0000 meq | INTRAMUSCULAR | Status: DC
  Filled 2022-04-22: qty 9.85

## 2022-04-22 MED ORDER — HEPARIN 30,000 UNITS/1000 ML (OHS) CELLSAVER SOLUTION
Status: DC
  Filled 2022-04-22: qty 1000

## 2022-04-22 MED ORDER — POTASSIUM CHLORIDE 2 MEQ/ML IV SOLN
80.0000 meq | INTRAVENOUS | Status: DC
  Filled 2022-04-22: qty 40

## 2022-04-22 MED ORDER — CEFAZOLIN SODIUM-DEXTROSE 2-4 GM/100ML-% IV SOLN
2.0000 g | INTRAVENOUS | Status: AC
  Administered 2022-04-23: 2 g via INTRAVENOUS
  Filled 2022-04-22 (×2): qty 100

## 2022-04-22 MED ORDER — NOREPINEPHRINE 4 MG/250ML-% IV SOLN
0.0000 ug/min | INTRAVENOUS | Status: AC
  Administered 2022-04-23: 1 ug/min via INTRAVENOUS
  Filled 2022-04-22: qty 250

## 2022-04-22 NOTE — Progress Notes (Signed)
Spoke with Manufacturing engineer in the main medical unit of the [facility]. Instructions only. NPO after midnight. Arrival time is 0800 per order and not to take any medications in the AM. Gave number to Pre-op desk.  ?

## 2022-04-22 NOTE — H&P (Signed)
? ?   ?[address] ?      [place] [address] ?            [phone]   ? ?  ?Cardiothoracic Surgery Admission History and Physical ? ? ?PCP is Patient, No Pcp Per (Inactive) ?Referring Provider is Sherren Mocha, MD ?Primary Cardiologist is Shelva Majestic, MD ?  ?Reason for admission:  Severe aortic stenosis and severe mitral regurgitation ?  ?HPI: ?  ?The patient is a 76 year old inmate with a history of hypertension, hyperlipidemia, type 2 diabetes, myocardial infarction, status post CABG about 15 years ago with multiple subsequent PCI procedures who presents with progressive exertional fatigue and shortness of breath.  He said that he is now having shortness of breath with walking less than 50 yards.  He denies any substernal chest discomfort.  He has had frequent episodes of dizziness but denies syncope.  He does have some peripheral edema.  He denies orthopnea and PND.  An echo in April 2022 showed a trileaflet aortic valve with severe thickening and calcification.  The mean gradient was measured at 26 mmHg with a peak gradient of 45 mmHg.  Valve area was 0.92 cm?.  Dimensionless index was 0.24 with a stroke-volume index of 31.  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  A TEE was done on 08/23/2021 which showed severe calcification of the aortic valve with a mean gradient of 30 mmHg and a valve area of 0.7 cm? by VTI.  There was severe eccentric mitral valve regurgitation with a flail P2 segment of the posterior leaflet.  There was a small mobile lesion on the atrial surface of the mitral valve that was suspicious for possible old endocarditis.  There was pulmonary vein blunting and systolic reversal noted consistent with severe regurgitation.  Left ventricular ejection fraction was 55 to 60%.  He subsequent underwent cardiac catheterization on 02/01/2022 showing multivessel  coronary disease with a patent LIMA to the LAD and continued patency of the left main and left circumflex stents with mild to moderate in-stent restenosis.  There is continued patency of the RCA with mild stenosis.  There is severe aortic stenosis with a mean gradient across the valve of 43 mmHg and a peak to peak gradient of 51 mmHg.  Calculated valve area was 0.77 cm?Marland Kitchen  There was moderate pulmonary hypertension with a PA pressure of 59/22 with a mean of 36 and a large V wave of 24 mmHg consistent with severe mitral regurgitation. ?  ? He uses a walker for ambulation to help with balance. ?  ?    ?Past Medical History:  ?Diagnosis Date  ? Anxiety    ? Arthritis    ?  "hands, lower back,; L4-5;  neck; C1-2" (03/31/2018)  ? Asbestosis ([place])    ?  "of my lungs"  ? Asthma    ? CAD (coronary artery disease)    ?  CABG 14 days ago  ? CAD S/P percutaneous coronary angioplasty 03/31/2018  ?  LM PCI  ? Chronic lower back pain    ? Heart murmur    ? High cholesterol    ? Hypertension    ? Myocardial infarction [facility]) 2006; 2007; 2012  ? Prostate cancer ([place])    ?  "put 3 beamers in my prostate in 11/2017 & radiation completed in 02/2018" (03/31/2018)  ? Type II diabetes mellitus (  [place])    ?  ?  ?     ?Past Surgical History:  ?Procedure Laterality Date  ? BACK SURGERY      ? BUBBLE STUDY   08/23/2021  ?  Procedure: BUBBLE STUDY;  Surgeon: [name], MD;  Location: [place];  Service: Cardiology;;  ? CORONARY ANGIOPLASTY WITH STENT PLACEMENT   03/31/2018  ? CORONARY ARTERY BYPASS GRAFT   2007  ? CORONARY BALLOON ANGIOPLASTY N/A 08/17/2018  ?  Procedure: CORONARY BALLOON ANGIOPLASTY;  Surgeon: Troy Sine, MD;  Location: [place] [facility];  Service: Cardiovascular;  Laterality: N/A;  ? CORONARY STENT INTERVENTION N/A 03/31/2018  ?  Procedure: CORONARY STENT INTERVENTION;  Surgeon: Troy Sine, MD;  Location: [place] [facility];  Service: Cardiovascular;  Laterality: N/A;  ? CORONARY STENT INTERVENTION N/A 08/17/2018  ?   Procedure: CORONARY STENT INTERVENTION;  Surgeon: Troy Sine, MD;  Location: [place] [facility];  Service: Cardiovascular;  Laterality: N/A;  ? FOREARM FRACTURE SURGERY Right    ? FRACTURE SURGERY      ? HEMORRHOID BANDING      ? LAPAROSCOPIC CHOLECYSTECTOMY      ? LEFT HEART CATH AND CORONARY ANGIOGRAPHY N/A 03/31/2018  ?  Procedure: LEFT HEART CATH AND CORONARY ANGIOGRAPHY;  Surgeon: Troy Sine, MD;  Location: [place] [facility];  Service: Cardiovascular;  Laterality: N/A;  ? LEFT HEART CATH AND CORS/GRAFTS ANGIOGRAPHY N/A 08/17/2018  ?  Procedure: LEFT HEART CATH AND CORS/GRAFTS ANGIOGRAPHY;  Surgeon: Troy Sine, MD;  Location: [place] [facility];  Service: Cardiovascular;  Laterality: N/A;  ? ORIF SHOULDER FRACTURE Left    ? POSTERIOR FUSION LUMBAR SPINE      ?  L4-5  ? PROSTATE BIOPSY      ?  "put in [place] [employer] in 11/2017"  ? RIGHT/LEFT HEART CATH AND CORONARY/GRAFT ANGIOGRAPHY N/A 02/01/2022  ?  Procedure: RIGHT/LEFT HEART CATH AND CORONARY/GRAFT ANGIOGRAPHY;  Surgeon: Sherren Mocha, MD;  Location: [place] [facility];  Service: Cardiovascular;  Laterality: N/A;  ? TEE WITHOUT CARDIOVERSION N/A 08/23/2021  ?  Procedure: TRANSESOPHAGEAL ECHOCARDIOGRAM (TEE);  Surgeon: [name], MD;  Location: [facility];  Service: Cardiology;  Laterality: N/A;  ?  ?  ?     ?Family History  ?Problem Relation Age of Onset  ? Hypertension Mother    ?  ?  ?Social History  ?  ?     ?Socioeconomic History  ? Marital status: Divorced  ?    Spouse name: Not on file  ? Number of children: Not on file  ? Years of education: Not on file  ? Highest education level: Not on file  ?Occupational History  ? Not on file  ?Tobacco Use  ? Smoking status: Never  ? Smokeless tobacco: Former  ?    Types: Chew  ?Vaping Use  ? Vaping Use: Never used  ?Substance and Sexual Activity  ? Alcohol use: Not Currently  ? Drug use: Not Currently  ? Sexual activity: Not Currently  ?Other Topics Concern  ? Not on file   ?Social History Narrative  ? Not on file  ?  ?Social Determinants of Health  ?  ?Financial Resource Strain: Not on file  ?Food Insecurity: Not on file  ?Transportation Needs: Not on file  ?Physical Activity: Not on file  ?Stress: Not on file  ?Social Connections: Not on file  ?Intimate Partner Violence: Not on file  ?  ?  ?       ?  Prior to Admission medications   ?Medication Sig Start Date End Date Taking? Authorizing Provider  ?acetaminophen (TYLENOL) 325 MG tablet Take 650 mg by mouth 3 (three) times daily as needed for moderate pain.     Yes [provider]  ?albuterol (PROVENTIL HFA;VENTOLIN HFA) 108 (90 Base) MCG/ACT inhaler Inhale 2 puffs into the lungs 4 (four) times daily as needed for wheezing or shortness of breath.     Yes [provider]  ?albuterol (PROVENTIL) (2.5 MG/3ML) 0.083% nebulizer solution Take 2.5 mg by nebulization every 6 (six) hours as needed for wheezing or shortness of breath.     Yes [provider]  ?Calcium Citrate-Vitamin D 315-250 MG-UNIT TABS Take 2 tablets by mouth in the morning and at bedtime.     Yes [provider]  ?carvedilol (COREG) 6.25 MG tablet Take 6.25 mg by mouth 2 (two) times daily.     Yes [provider]  ?clopidogrel (PLAVIX) 75 MG tablet Take 75 mg by mouth daily.     Yes [provider]  ?Dextran 70-Hypromellose (ARTIFICIAL TEARS) 0.1-0.3 % SOLN Place 1 drop into both eyes 4 (four) times daily as needed (burning).     Yes [provider]  ?docusate sodium (COLACE) 100 MG capsule Take 100 mg by mouth 2 (two) times daily.     Yes [provider]  ?DULoxetine (CYMBALTA) 20 MG capsule Take 20 mg by mouth at bedtime.     Yes [provider]  ?enzalutamide (XTANDI) 40 MG capsule Take 160 mg by mouth daily. 03/06/21   Yes [provider]  ?furosemide (LASIX) 20 MG tablet Take 20 mg every other day 11/05/21   Yes Troy Sine, MD  ?gabapentin (NEURONTIN) 300 MG capsule Take 600  mg by mouth 3 (three) times daily.      Yes [provider]  ?glipiZIDE (GLUCOTROL XL) 10 MG 24 hr tablet Take 10 mg by mouth 2 (two) times daily.     Yes [provider]  ?insulin gla

## 2022-04-23 ENCOUNTER — Inpatient Hospital Stay (HOSPITAL_COMMUNITY): Admitting: Physician Assistant

## 2022-04-23 ENCOUNTER — Inpatient Hospital Stay (HOSPITAL_COMMUNITY)

## 2022-04-23 ENCOUNTER — Other Ambulatory Visit: Payer: Self-pay

## 2022-04-23 ENCOUNTER — Encounter (HOSPITAL_COMMUNITY): Admission: RE | Payer: Self-pay | Attending: Cardiovascular Disease

## 2022-04-23 ENCOUNTER — Inpatient Hospital Stay (HOSPITAL_COMMUNITY)
Admission: RE | Admit: 2022-04-23 | Discharge: 2022-04-24 | DRG: 267 | Attending: Cardiovascular Disease | Admitting: Cardiovascular Disease

## 2022-04-23 ENCOUNTER — Encounter (HOSPITAL_COMMUNITY): Payer: Self-pay | Admitting: Cardiovascular Disease

## 2022-04-23 DIAGNOSIS — Y831 Surgical operation with implant of artificial internal device as the cause of abnormal reaction of the patient, or of later complication, without mention of misadventure at the time of the procedure: Secondary | ICD-10-CM | POA: Diagnosis present

## 2022-04-23 DIAGNOSIS — Z8546 Personal history of malignant neoplasm of prostate: Secondary | ICD-10-CM | POA: Diagnosis not present

## 2022-04-23 DIAGNOSIS — Z952 Presence of prosthetic heart valve: Secondary | ICD-10-CM

## 2022-04-23 DIAGNOSIS — I255 Ischemic cardiomyopathy: Secondary | ICD-10-CM | POA: Diagnosis present

## 2022-04-23 DIAGNOSIS — Z7902 Long term (current) use of antithrombotics/antiplatelets: Secondary | ICD-10-CM

## 2022-04-23 DIAGNOSIS — Z923 Personal history of irradiation: Secondary | ICD-10-CM | POA: Diagnosis not present

## 2022-04-23 DIAGNOSIS — I10 Essential (primary) hypertension: Secondary | ICD-10-CM | POA: Diagnosis present

## 2022-04-23 DIAGNOSIS — I2510 Atherosclerotic heart disease of native coronary artery without angina pectoris: Secondary | ICD-10-CM

## 2022-04-23 DIAGNOSIS — E119 Type 2 diabetes mellitus without complications: Secondary | ICD-10-CM

## 2022-04-23 DIAGNOSIS — I447 Left bundle-branch block, unspecified: Secondary | ICD-10-CM | POA: Diagnosis not present

## 2022-04-23 DIAGNOSIS — D500 Iron deficiency anemia secondary to blood loss (chronic): Secondary | ICD-10-CM | POA: Diagnosis present

## 2022-04-23 DIAGNOSIS — Z8249 Family history of ischemic heart disease and other diseases of the circulatory system: Secondary | ICD-10-CM | POA: Diagnosis not present

## 2022-04-23 DIAGNOSIS — I350 Nonrheumatic aortic (valve) stenosis: Principal | ICD-10-CM | POA: Diagnosis present

## 2022-04-23 DIAGNOSIS — Z7984 Long term (current) use of oral hypoglycemic drugs: Secondary | ICD-10-CM

## 2022-04-23 DIAGNOSIS — Z87891 Personal history of nicotine dependence: Secondary | ICD-10-CM | POA: Diagnosis not present

## 2022-04-23 DIAGNOSIS — Z006 Encounter for examination for normal comparison and control in clinical research program: Secondary | ICD-10-CM

## 2022-04-23 DIAGNOSIS — Z951 Presence of aortocoronary bypass graft: Secondary | ICD-10-CM

## 2022-04-23 DIAGNOSIS — E7800 Pure hypercholesterolemia, unspecified: Secondary | ICD-10-CM | POA: Diagnosis present

## 2022-04-23 DIAGNOSIS — I272 Pulmonary hypertension, unspecified: Secondary | ICD-10-CM | POA: Diagnosis present

## 2022-04-23 DIAGNOSIS — I252 Old myocardial infarction: Secondary | ICD-10-CM

## 2022-04-23 DIAGNOSIS — E785 Hyperlipidemia, unspecified: Secondary | ICD-10-CM | POA: Diagnosis present

## 2022-04-23 DIAGNOSIS — Z888 Allergy status to other drugs, medicaments and biological substances status: Secondary | ICD-10-CM

## 2022-04-23 DIAGNOSIS — J45909 Unspecified asthma, uncomplicated: Secondary | ICD-10-CM | POA: Diagnosis present

## 2022-04-23 DIAGNOSIS — Z79899 Other long term (current) drug therapy: Secondary | ICD-10-CM

## 2022-04-23 DIAGNOSIS — T82855A Stenosis of coronary artery stent, initial encounter: Secondary | ICD-10-CM | POA: Diagnosis present

## 2022-04-23 DIAGNOSIS — I340 Nonrheumatic mitral (valve) insufficiency: Secondary | ICD-10-CM | POA: Diagnosis present

## 2022-04-23 DIAGNOSIS — Z794 Long term (current) use of insulin: Secondary | ICD-10-CM

## 2022-04-23 DIAGNOSIS — Z20822 Contact with and (suspected) exposure to covid-19: Secondary | ICD-10-CM | POA: Diagnosis present

## 2022-04-23 DIAGNOSIS — I080 Rheumatic disorders of both mitral and aortic valves: Secondary | ICD-10-CM | POA: Diagnosis not present

## 2022-04-23 HISTORY — DX: Nonrheumatic mitral (valve) insufficiency: I34.0

## 2022-04-23 HISTORY — PX: TRANSCATHETER AORTIC VALVE REPLACEMENT, TRANSFEMORAL: SHX6400

## 2022-04-23 HISTORY — PX: INTRAOPERATIVE TRANSTHORACIC ECHOCARDIOGRAM: SHX6523

## 2022-04-23 HISTORY — DX: Nonrheumatic aortic (valve) stenosis: I35.0

## 2022-04-23 LAB — POCT I-STAT, CHEM 8
BUN: 15 mg/dL (ref 8–23)
BUN: 14 mg/dL (ref 8–23)
Calcium, Ion: 1.17 mmol/L (ref 1.15–1.40)
Calcium, Ion: 1.13 mmol/L — ABNORMAL LOW (ref 1.15–1.40)
Chloride: 107 mmol/L (ref 98–111)
Chloride: 106 mmol/L (ref 98–111)
Creatinine, Ser: 0.7 mg/dL (ref 0.61–1.24)
Creatinine, Ser: 0.6 mg/dL — ABNORMAL LOW (ref 0.61–1.24)
Glucose, Bld: 103 mg/dL — ABNORMAL HIGH (ref 70–99)
Glucose, Bld: 129 mg/dL — ABNORMAL HIGH (ref 70–99)
HCT: 38 % — ABNORMAL LOW (ref 39.0–52.0)
HCT: 34 % — ABNORMAL LOW (ref 39.0–52.0)
Hemoglobin: 12.9 g/dL — ABNORMAL LOW (ref 13.0–17.0)
Hemoglobin: 11.6 g/dL — ABNORMAL LOW (ref 13.0–17.0)
Potassium: 4 mmol/L (ref 3.5–5.1)
Potassium: 3.9 mmol/L (ref 3.5–5.1)
Sodium: 139 mmol/L (ref 135–145)
Sodium: 140 mmol/L (ref 135–145)
TCO2: 23 mmol/L (ref 22–32)
TCO2: 23 mmol/L (ref 22–32)

## 2022-04-23 LAB — ECHOCARDIOGRAM LIMITED
AR max vel: 4.42 cm2
AV Area VTI: 4.31 cm2
AV Area mean vel: 4.06 cm2
AV Mean grad: 3.5 mmHg
AV Peak grad: 6.4 mmHg
Ao pk vel: 1.26 m/s
Calc EF: 59.7 %
MV M vel: 5.22 m/s
MV Peak grad: 109 mmHg
Radius: 1.1 cm
S' Lateral: 3.8 cm
Single Plane A2C EF: 57.9 %
Single Plane A4C EF: 60.8 %

## 2022-04-23 LAB — CBC
HCT: 40.2 % (ref 39.0–52.0)
Hemoglobin: 13.5 g/dL (ref 13.0–17.0)
MCH: 30.9 pg (ref 26.0–34.0)
MCHC: 33.6 g/dL (ref 30.0–36.0)
MCV: 92 fL (ref 80.0–100.0)
Platelets: 170 10*3/uL (ref 150–400)
RBC: 4.37 MIL/uL (ref 4.22–5.81)
RDW: 13.4 % (ref 11.5–15.5)
WBC: 5.3 10*3/uL (ref 4.0–10.5)
nRBC: 0 % (ref 0.0–0.2)

## 2022-04-23 LAB — URINALYSIS, ROUTINE W REFLEX MICROSCOPIC
Bacteria, UA: NONE SEEN
Bilirubin Urine: NEGATIVE
Glucose, UA: NEGATIVE mg/dL
Hgb urine dipstick: NEGATIVE
Ketones, ur: NEGATIVE mg/dL
Leukocytes,Ua: NEGATIVE
Nitrite: NEGATIVE
Protein, ur: 30 mg/dL — AB
Specific Gravity, Urine: 1.013 (ref 1.005–1.030)
pH: 6 (ref 5.0–8.0)

## 2022-04-23 LAB — COMPREHENSIVE METABOLIC PANEL
ALT: 12 U/L (ref 0–44)
AST: 18 U/L (ref 15–41)
Albumin: 3.7 g/dL (ref 3.5–5.0)
Alkaline Phosphatase: 107 U/L (ref 38–126)
Anion gap: 8 (ref 5–15)
BUN: 16 mg/dL (ref 8–23)
CO2: 23 mmol/L (ref 22–32)
Calcium: 8.6 mg/dL — ABNORMAL LOW (ref 8.9–10.3)
Chloride: 107 mmol/L (ref 98–111)
Creatinine, Ser: 0.9 mg/dL (ref 0.61–1.24)
GFR, Estimated: >60 mL/min (ref >60)
Glucose, Bld: 115 mg/dL — ABNORMAL HIGH (ref 70–99)
Potassium: 4.2 mmol/L (ref 3.5–5.1)
Sodium: 138 mmol/L (ref 135–145)
Total Bilirubin: 0.7 mg/dL (ref 0.3–1.2)
Total Protein: 7.1 g/dL (ref 6.5–8.1)

## 2022-04-23 LAB — GLUCOSE, CAPILLARY
Glucose-Capillary: 137 mg/dL — ABNORMAL HIGH (ref 70–99)
Glucose-Capillary: 101 mg/dL — ABNORMAL HIGH (ref 70–99)
Glucose-Capillary: 105 mg/dL — ABNORMAL HIGH (ref 70–99)
Glucose-Capillary: 152 mg/dL — ABNORMAL HIGH (ref 70–99)

## 2022-04-23 LAB — PROTIME-INR
INR: 1 (ref 0.8–1.2)
Prothrombin Time: 13.2 seconds (ref 11.4–15.2)

## 2022-04-23 LAB — TYPE AND SCREEN
ABO/RH(D): O POS
Antibody Screen: NEGATIVE

## 2022-04-23 LAB — SURGICAL PCR SCREEN
MRSA, PCR: POSITIVE — AB
Staphylococcus aureus: POSITIVE — AB

## 2022-04-23 LAB — POCT ACTIVATED CLOTTING TIME
Activated Clotting Time: 137 seconds
Activated Clotting Time: 299 seconds
Activated Clotting Time: 119 seconds

## 2022-04-23 LAB — ABO/RH: ABO/RH(D): O POS

## 2022-04-23 LAB — SARS CORONAVIRUS 2 BY RT PCR: SARS Coronavirus 2 by RT PCR: NEGATIVE

## 2022-04-23 SURGERY — IMPLANTATION, AORTIC VALVE, TRANSCATHETER, FEMORAL APPROACH
Anesthesia: Monitor Anesthesia Care

## 2022-04-23 MED ORDER — HEPARIN SODIUM (PORCINE) 1000 UNIT/ML IJ SOLN
INTRAMUSCULAR | Status: DC | PRN
  Administered 2022-04-23: 12000 [IU] via INTRAVENOUS

## 2022-04-23 MED ORDER — CLOPIDOGREL BISULFATE 75 MG PO TABS
75.0000 mg | ORAL_TABLET | Freq: Every day | ORAL | Status: DC
  Administered 2022-04-23 – 2022-04-24 (×2): 75 mg via ORAL
  Filled 2022-04-23 (×2): qty 1

## 2022-04-23 MED ORDER — DULOXETINE HCL 20 MG PO CPEP
20.0000 mg | ORAL_CAPSULE | Freq: Every day | ORAL | Status: DC
  Administered 2022-04-23: 20 mg via ORAL
  Filled 2022-04-23 (×2): qty 1

## 2022-04-23 MED ORDER — MORPHINE SULFATE (PF) 2 MG/ML IV SOLN: 1.0000 mg | INTRAVENOUS | Status: DC | PRN

## 2022-04-23 MED ORDER — LACTATED RINGERS IV SOLN: INTRAVENOUS | Status: DC

## 2022-04-23 MED ORDER — MUPIROCIN 2 % EX OINT
1.0000 "application " | TOPICAL_OINTMENT | Freq: Two times a day (BID) | CUTANEOUS | Status: DC
  Administered 2022-04-23 – 2022-04-24 (×2): 1 via NASAL

## 2022-04-23 MED ORDER — CHLORHEXIDINE GLUCONATE 4 % EX LIQD: 30.0000 mL | CUTANEOUS | Status: DC

## 2022-04-23 MED ORDER — SODIUM CHLORIDE 0.9% FLUSH: 3.0000 mL | INTRAVENOUS | Status: DC | PRN

## 2022-04-23 MED ORDER — HEPARIN (PORCINE) IN NACL 1000-0.9 UT/500ML-% IV SOLN
INTRAVENOUS | Status: AC
  Filled 2022-04-23: qty 1500

## 2022-04-23 MED ORDER — PROPOFOL 10 MG/ML IV BOLUS
INTRAVENOUS | Status: DC | PRN
  Administered 2022-04-23 (×3): 10 mg via INTRAVENOUS

## 2022-04-23 MED ORDER — PHENYLEPHRINE 80 MCG/ML (10ML) SYRINGE FOR IV PUSH (FOR BLOOD PRESSURE SUPPORT)
PREFILLED_SYRINGE | INTRAVENOUS | Status: DC | PRN
Start: 2022-04-23 — End: 2022-04-23
  Administered 2022-04-23: 80 ug via INTRAVENOUS

## 2022-04-23 MED ORDER — ISOSORBIDE MONONITRATE ER 30 MG PO TB24
30.0000 mg | ORAL_TABLET | Freq: Every day | ORAL | Status: DC
  Administered 2022-04-23 – 2022-04-24 (×2): 30 mg via ORAL
  Filled 2022-04-23 (×2): qty 1

## 2022-04-23 MED ORDER — OXYCODONE HCL 5 MG PO TABS: 5.0000 mg | ORAL_TABLET | ORAL | Status: DC | PRN

## 2022-04-23 MED ORDER — FENTANYL CITRATE (PF) 100 MCG/2ML IJ SOLN
INTRAMUSCULAR | Status: DC | PRN
  Administered 2022-04-23: 50 ug via INTRAVENOUS

## 2022-04-23 MED ORDER — SODIUM CHLORIDE 0.9 % IV SOLN: INTRAVENOUS | Status: DC

## 2022-04-23 MED ORDER — MECLIZINE HCL 25 MG PO TABS
25.0000 mg | ORAL_TABLET | Freq: Three times a day (TID) | ORAL | Status: DC | PRN
  Filled 2022-04-23: qty 1

## 2022-04-23 MED ORDER — SODIUM CHLORIDE 0.9% FLUSH
3.0000 mL | Freq: Two times a day (BID) | INTRAVENOUS | Status: DC
  Administered 2022-04-23 – 2022-04-24 (×2): 3 mL via INTRAVENOUS

## 2022-04-23 MED ORDER — INSULIN ASPART 100 UNIT/ML IJ SOLN
0.0000 [IU] | Freq: Three times a day (TID) | INTRAMUSCULAR | Status: DC
  Administered 2022-04-23: 2 [IU] via SUBCUTANEOUS

## 2022-04-23 MED ORDER — ACETAMINOPHEN 650 MG RE SUPP
650.0000 mg | Freq: Four times a day (QID) | RECTAL | Status: DC | PRN
Start: 2022-04-23 — End: 2022-04-24

## 2022-04-23 MED ORDER — ONDANSETRON HCL 4 MG/2ML IJ SOLN
INTRAMUSCULAR | Status: DC | PRN
  Administered 2022-04-23: 4 mg via INTRAVENOUS

## 2022-04-23 MED ORDER — SODIUM CHLORIDE 0.9 % IV SOLN: INTRAVENOUS | Status: AC

## 2022-04-23 MED ORDER — CHLORHEXIDINE GLUCONATE 0.12 % MT SOLN: 15.0000 mL | Freq: Once | OROMUCOSAL | Status: AC

## 2022-04-23 MED ORDER — ONDANSETRON HCL 4 MG/2ML IJ SOLN: 4.0000 mg | Freq: Four times a day (QID) | INTRAMUSCULAR | Status: DC | PRN

## 2022-04-23 MED ORDER — DEXMEDETOMIDINE (PRECEDEX) IN NS 20 MCG/5ML (4 MCG/ML) IV SYRINGE
PREFILLED_SYRINGE | INTRAVENOUS | Status: DC | PRN
  Administered 2022-04-23: 83.92 ug via INTRAVENOUS

## 2022-04-23 MED ORDER — PROPOFOL 500 MG/50ML IV EMUL
INTRAVENOUS | Status: DC | PRN
  Administered 2022-04-23: 15 ug/kg/min via INTRAVENOUS

## 2022-04-23 MED ORDER — CHLORHEXIDINE GLUCONATE 0.12 % MT SOLN
OROMUCOSAL | Status: AC
  Administered 2022-04-23: 15 mL via OROMUCOSAL
  Filled 2022-04-23: qty 15

## 2022-04-23 MED ORDER — CHLORHEXIDINE GLUCONATE 4 % EX LIQD: 60.0000 mL | Freq: Once | CUTANEOUS | Status: DC

## 2022-04-23 MED ORDER — PROTAMINE SULFATE 10 MG/ML IV SOLN
INTRAVENOUS | Status: DC | PRN
  Administered 2022-04-23: 120 mg via INTRAVENOUS

## 2022-04-23 MED ORDER — CHLORHEXIDINE GLUCONATE CLOTH 2 % EX PADS
6.0000 | MEDICATED_PAD | Freq: Every day | CUTANEOUS | Status: DC
  Administered 2022-04-24: 6 via TOPICAL

## 2022-04-23 MED ORDER — CEFAZOLIN SODIUM-DEXTROSE 2-4 GM/100ML-% IV SOLN
2.0000 g | Freq: Three times a day (TID) | INTRAVENOUS | Status: AC
  Administered 2022-04-23 – 2022-04-24 (×2): 2 g via INTRAVENOUS
  Filled 2022-04-23 (×2): qty 100

## 2022-04-23 MED ORDER — LIDOCAINE HCL (PF) 1 % IJ SOLN
INTRAMUSCULAR | Status: DC | PRN
  Administered 2022-04-23 (×2): 15 mL

## 2022-04-23 MED ORDER — GABAPENTIN 300 MG PO CAPS
300.0000 mg | ORAL_CAPSULE | Freq: Three times a day (TID) | ORAL | Status: DC
  Administered 2022-04-23 – 2022-04-24 (×2): 300 mg via ORAL
  Filled 2022-04-23 (×3): qty 1

## 2022-04-23 MED ORDER — ACETAMINOPHEN 325 MG PO TABS: 650.0000 mg | ORAL_TABLET | Freq: Four times a day (QID) | ORAL | Status: DC | PRN

## 2022-04-23 MED ORDER — HEPARIN (PORCINE) IN NACL 1000-0.9 UT/500ML-% IV SOLN
INTRAVENOUS | Status: DC | PRN
  Administered 2022-04-23 (×2): 500 mL

## 2022-04-23 MED ORDER — IOHEXOL 350 MG/ML SOLN
INTRAVENOUS | Status: DC | PRN
  Administered 2022-04-23: 20 mL

## 2022-04-23 MED ORDER — HEPARIN (PORCINE) IN NACL 1000-0.9 UT/500ML-% IV SOLN
INTRAVENOUS | Status: DC | PRN
Start: 2022-04-23 — End: 2022-04-23
  Administered 2022-04-23: 500 mL

## 2022-04-23 MED ORDER — TRAMADOL HCL 50 MG PO TABS: 50.0000 mg | ORAL_TABLET | ORAL | Status: DC | PRN

## 2022-04-23 MED ORDER — SODIUM CHLORIDE 0.9 % IV SOLN: 250.0000 mL | INTRAVENOUS | Status: DC | PRN

## 2022-04-23 MED ORDER — TAMSULOSIN HCL 0.4 MG PO CAPS
0.4000 mg | ORAL_CAPSULE | Freq: Two times a day (BID) | ORAL | Status: DC
  Administered 2022-04-23 – 2022-04-24 (×2): 0.4 mg via ORAL
  Filled 2022-04-23 (×2): qty 1

## 2022-04-23 MED ORDER — LIDOCAINE HCL (PF) 1 % IJ SOLN
INTRAMUSCULAR | Status: AC
  Filled 2022-04-23: qty 30

## 2022-04-23 MED ORDER — NITROGLYCERIN IN D5W 200-5 MCG/ML-% IV SOLN: 0.0000 ug/min | INTRAVENOUS | Status: DC

## 2022-04-23 MED ORDER — PANTOPRAZOLE SODIUM 20 MG PO TBEC
20.0000 mg | DELAYED_RELEASE_TABLET | Freq: Two times a day (BID) | ORAL | Status: DC
  Administered 2022-04-23 – 2022-04-24 (×2): 20 mg via ORAL
  Filled 2022-04-23 (×2): qty 1

## 2022-04-23 MED ORDER — INSULIN ASPART 100 UNIT/ML IJ SOLN: 0.0000 [IU] | INTRAMUSCULAR | Status: DC | PRN

## 2022-04-23 MED ORDER — DOCUSATE SODIUM 100 MG PO CAPS
100.0000 mg | ORAL_CAPSULE | Freq: Two times a day (BID) | ORAL | Status: DC
  Administered 2022-04-23 – 2022-04-24 (×2): 100 mg via ORAL
  Filled 2022-04-23 (×2): qty 1

## 2022-04-23 MED ORDER — FENTANYL CITRATE (PF) 100 MCG/2ML IJ SOLN
INTRAMUSCULAR | Status: AC
  Filled 2022-04-23: qty 2

## 2022-04-23 MED ORDER — ENZALUTAMIDE 40 MG PO CAPS: 160.0000 mg | ORAL_CAPSULE | Freq: Every day | ORAL | Status: DC

## 2022-04-23 SURGICAL SUPPLY — 31 items
BAG SNAP BAND KOVER 36X36 (MISCELLANEOUS) ×6 IMPLANT
CABLE ADAPT PACING TEMP 12FT (ADAPTER) ×1 IMPLANT
CATH 26 ULTRA DELIVERY (CATHETERS) ×1 IMPLANT
CATH DIAG 6FR PIGTAIL ANGLED (CATHETERS) ×2 IMPLANT
CATH INFINITI 6F AL2 (CATHETERS) ×1 IMPLANT
CATH S G BIP PACING (CATHETERS) ×1 IMPLANT
CLOSURE MYNX CONTROL 6F/7F (Vascular Products) ×1 IMPLANT
CLOSURE PERCLOSE PROSTYLE (VASCULAR PRODUCTS) ×2 IMPLANT
CRIMPER (MISCELLANEOUS) ×1 IMPLANT
DEVICE INFLATION ATRION QL2530 (MISCELLANEOUS) ×1 IMPLANT
GUIDEWIRE SAFE TJ AMPLATZ EXST (WIRE) ×1 IMPLANT
KIT HEART LEFT (KITS) ×2 IMPLANT
KIT MICROPUNCTURE NIT STIFF (SHEATH) ×1 IMPLANT
KIT SAPIAN 3 ULTRA RESILIA 26 (Valve) ×1 IMPLANT
PACK CARDIAC CATHETERIZATION (CUSTOM PROCEDURE TRAY) ×2 IMPLANT
SHEATH BRITE TIP 7FR 35CM (SHEATH) ×1 IMPLANT
SHEATH INTRODUCER SET 20-26 (SHEATH) ×1 IMPLANT
SHEATH PINNACLE 6F 10CM (SHEATH) ×1 IMPLANT
SHEATH PINNACLE 8F 10CM (SHEATH) ×1 IMPLANT
SHEATH PROBE COVER 6X72 (BAG) ×1 IMPLANT
SHIELD RADPAD SCOOP 12X17 (MISCELLANEOUS) ×1 IMPLANT
SLEEVE REPOSITIONING LENGTH 30 (MISCELLANEOUS) ×1 IMPLANT
STOPCOCK MORSE 400PSI 3WAY (MISCELLANEOUS) ×4 IMPLANT
SYR MEDRAD MARK V 150ML (SYRINGE) ×1 IMPLANT
TRANSDUCER W/STOPCOCK (MISCELLANEOUS) ×4 IMPLANT
TUBING CONTRAST HIGH PRESS 48 (TUBING) ×1 IMPLANT
WIRE EMERALD 3MM-J .025X260CM (WIRE) ×1 IMPLANT
WIRE EMERALD 3MM-J .035X150CM (WIRE) ×1 IMPLANT
WIRE EMERALD 3MM-J .035X260CM (WIRE) ×1 IMPLANT
WIRE EMERALD ST .035X260CM (WIRE) ×1 IMPLANT
WIRE SAFARI SM CURVE 275 (WIRE) ×1 IMPLANT

## 2022-04-23 NOTE — Progress Notes (Signed)
?    R radial arterial line was pulled, and manual pressure was held for 10 min. Sterile gauze was applied at the site. ? R radial pulse was 2+, and capillary refill < 3 sec. ?

## 2022-04-23 NOTE — Anesthesia Procedure Notes (Signed)
Procedure Name: [place] ?Date/Time: 04/23/2022 1:00 PM ?Performed by: Inda Coke, CRNA ?Pre-anesthesia Checklist: Patient identified, Emergency Drugs available, Suction available, Timeout performed and Patient being monitored ?Patient Re-evaluated:Patient Re-evaluated prior to induction ?Oxygen Delivery Method: Simple face mask ?Induction Type: IV induction ?Dental Injury: Teeth and Oropharynx as per pre-operative assessment  ? ? ? ? ?

## 2022-04-23 NOTE — Discharge Instructions (Signed)

## 2022-04-23 NOTE — Transfer of Care (Signed)
Immediate Anesthesia Transfer of Care Note ? ?Patient: Cardin Nitschke ? ?Procedure(s) Performed: Transcatheter Aortic Valve Replacement, Transfemoral ?INTRAOPERATIVE TRANSTHORACIC ECHOCARDIOGRAM ? ?Patient Location: Cath Lab ? ?Anesthesia Type:MAC ? ?Level of Consciousness: awake and alert  ? ?Airway & Oxygen Therapy: Patient Spontanous Breathing and Patient connected to face mask oxygen ? ?Post-op Assessment: Report given to RN and Post -op Vital signs reviewed and stable ? ?Post vital signs: Reviewed and stable ? ?Last Vitals:  ?Vitals Value Taken Time  ?BP 144/71 04/23/22 1451  ?Temp    ?Pulse 73 04/23/22 1454  ?Resp 18 04/23/22 1454  ?SpO2 100 % 04/23/22 1454  ?Vitals shown include unvalidated device data. ? ?Last Pain:  ?Vitals:  ? 04/23/22 1006  ?TempSrc:   ?PainSc: 6   ?   ? ?  ? ?Complications: There were no known notable events for this encounter. ?

## 2022-04-23 NOTE — Progress Notes (Signed)
?  [facility]   ?MULTIDISCIPLINARY HEART VALVE TEAM ? ?Patient doing well s/p TAVR. He is hemodynamically stable. Groin sites stable. ECG with new LBBB but no high grade block. Arterial line discontinued and transferred to 4E. Plan for early ambulation after bedrest completed and hopeful discharge over the next 24-48 hours.  ? ?Angelena Form PA-C  [facility]  ?Pager [phone] ? ?

## 2022-04-23 NOTE — Anesthesia Preprocedure Evaluation (Addendum)
Anesthesia Evaluation  ?Patient identified by MRN, date of birth, ID band ?Patient awake ? ? ? ?Reviewed: ?Allergy & Precautions, NPO status , Patient's Chart, lab work & pertinent test results ? ?Airway ?Mallampati: II ? ?TM Distance: >3 FB ?Neck ROM: Full ? ? ? Dental ? ?(+) Teeth Intact, Missing, Dental Advisory Given ?  ?Pulmonary ?asthma ,  ?  ?breath sounds clear to auscultation ? ? ? ? ? ? Cardiovascular ?hypertension, + CAD, + Cardiac Stents and + CABG  ?+ Valvular Problems/Murmurs MR and AS  ?Rhythm:Regular Rate:Normal ?+ Systolic murmurs ? ?  ?Neuro/Psych ?Anxiety negative neurological ROS ?   ? GI/Hepatic ?negative GI ROS, Neg liver ROS,   ?Endo/Other  ?diabetes, Type 2 ? Renal/GU ?negative Renal ROS  ? ?  ?Musculoskeletal ? ? Abdominal ?Normal abdominal exam  (+)   ?Peds ? Hematology ?  ?Anesthesia Other Findings ? ? Reproductive/Obstetrics ? ?  ? ? ? ? ? ? ? ? ? ? ? ? ? ?  ?  ? ? ? ? ? ? ? ?Anesthesia Physical ?Anesthesia Plan ? ?ASA: 4 ? ?Anesthesia Plan: MAC  ? ?Post-op Pain Management:   ? ?Induction: Intravenous ? ?PONV Risk Score and Plan: 0 and Propofol infusion and Ondansetron ? ?Airway Management Planned: Natural Airway and Simple Face Mask ? ?Additional Equipment: Arterial line ? ?Intra-op Plan:  ? ?Post-operative Plan:  ? ?Informed Consent: I have reviewed the patients History and Physical, chart, labs and discussed the procedure including the risks, benefits and alternatives for the proposed anesthesia with the patient or authorized representative who has indicated his/her understanding and acceptance.  ? ? ? ? ? ?Plan Discussed with: CRNA ? ?Anesthesia Plan Comments:   ? ? ? ? ? ?Anesthesia Quick Evaluation ? ?

## 2022-04-23 NOTE — Anesthesia Procedure Notes (Signed)
Arterial Line Insertion ?Start/End5/01/2022 12:25 PM, 04/23/2022 12:30 PM ?Performed by: Inda Coke, CRNA, CRNA ? Patient location: Pre-op. ?Preanesthetic checklist: patient identified, IV checked, site marked, risks and benefits discussed, surgical consent, monitors and equipment checked, pre-op evaluation, timeout performed and anesthesia consent ?Lidocaine 1% used for infiltration ?Right, radial was placed ?Catheter size: 20 G ?Hand hygiene performed  and maximum sterile barriers used  ?Allen's test indicative of satisfactory collateral circulation ?Attempts: 1 ?Procedure performed without using ultrasound guided technique. ?Following insertion, dressing applied and Biopatch. ?Post procedure assessment: normal ? ?Patient tolerated the procedure well with no immediate complications. ? ? ?

## 2022-04-23 NOTE — Interval H&P Note (Signed)
History and Physical Interval Note: ? ?04/23/2022 ?9:32 AM ? ?Miguel Garcia  has presented today for surgery, with the diagnosis of Severe Aortic Stenosis.  The various methods of treatment have been discussed with the patient and family. After consideration of risks, benefits and other options for treatment, the patient has consented to  Procedure(s): ?Transcatheter Aortic Valve Replacement, Transfemoral (N/A) ?INTRAOPERATIVE TRANSTHORACIC ECHOCARDIOGRAM (N/A) as a surgical intervention.  The patient's history has been reviewed, patient examined, no change in status, stable for surgery.  I have reviewed the patient's chart and labs.  Questions were answered to the patient's satisfaction.   ? ? ?Gaye Pollack ? ? ?

## 2022-04-23 NOTE — Op Note (Signed)
?[facility]   ?MULTIDISCIPLINARY HEART VALVE TEAM ? ? ?TAVR OPERATIVE NOTE ? ? ?Date of Procedure:  04/23/2022 ? ?Preoperative Diagnosis: Severe Aortic Stenosis  ? ?Postoperative Diagnosis: Same  ? ?Procedure:  ? ?Transcatheter Aortic Valve Replacement - Percutaneous Right Transfemoral Approach ? Edwards Sapien 3 Ultra ResiliaTHV (size 26 mm, model # 9755RSL, serial # [id number]) ?  ?Co-Surgeons:  Gaye Pollack, MD and Sherren Mocha, MD   ? ? ?Anesthesiologist:  Suella Broad, MD ? ?Echocardiographer:  Jenkins Rouge, MD ? ?Pre-operative Echo Findings: ?Severe aortic stenosis ?Normal left ventricular systolic function ?Severe mitral regurgitation ? ?Post-operative Echo Findings: ?No paravalvular leak ?Normal left ventricular systolic function ?Unchanged severe mitral regurgitation ? ? ?BRIEF CLINICAL NOTE AND INDICATIONS FOR SURGERY ? ?This 76 year old gentleman has stage D, severe, symptomatic aortic stenosis with New York Heart Association class III symptoms of exertional fatigue and shortness of breath consistent with chronic diastolic congestive heart failure.  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  There was a mobile echodensity on the atrial surface of the mitral valve suggesting the possibility of healed endocarditis.  I have personally reviewed his TEE and cardiac catheterization as well as CTA studies.  TEE in September 2022 showed a severely calcified and thickened aortic valve with a mean gradient of 30.5 mmHg and a valve area of 0.7 cm? consistent with severe aortic stenosis.  There was mild aortic insufficiency.  This TEE also showed a flail segment of the posterior mitral valve leaflet with severe eccentric mitral regurgitation.  Left ventricular ejection fraction was 55 to 60%.  Cardiac catheterization showed native multivessel coronary disease with  continued patency of his LIMA to the LAD and continued patency of his left main and left circumflex stents with mild to moderate in-stent restenosis.  The right coronary artery has mild stenosis.  The mean transvalvular gradient across the aortic valve was 43 mmHg with a peak to peak gradient of 51 mmHg and a valve area of 0.77 cm? consistent with severe aortic stenosis.  PA pressure was moderately elevated at 59/22 with large V waves of 24 mmHg consistent with significant mitral regurgitation.  I think he would be a very high risk candidate for redo CABG with aortic valve replacement and mitral valve repair given his age, prior CABG, and poor mobility.  I think the best alternative would be to treat his coronary disease medically, perform transcatheter aortic valve replacement, and see how his symptoms improve.  Treating his severe aortic stenosis may improve his mitral regurgitation.  If he remains significantly symptomatic then it is probably best to perform MitraClip repair of his mitral regurgitation.  His gated cardiac CTA shows anatomy suitable for TAVR using a SAPIEN 3 valve.  His abdominal and pelvic CTA shows adequate pelvic vascular anatomy to allow transfemoral insertion. ?  ?The patient was counseled at length regarding treatment alternatives for management of severe symptomatic aortic stenosis. The risks and benefits of surgical intervention has been discussed in detail. Long-term prognosis with medical therapy was discussed. Alternative approaches such as conventional surgical aortic valve replacement, transcatheter aortic valve replacement, and palliative medical therapy were compared and contrasted at length. This discussion was placed in the context of the patient's own specific clinical presentation and past medical history. All of his questions have been addressed.  ?  ?Following the decision to proceed with transcatheter aortic valve replacement, a  discussion was held regarding what types of  management strategies would be attempted intraoperatively in the event of life-threatening complications, including whether or not the patient would be considered a candidate for the use of cardiopulmonary bypass and/or conversion to open sternotomy for attempted surgical intervention.  I do not think he would be a candidate for emergent sternotomy to manage any intraoperative complications.  The patient is aware of the fact that transient use of cardiopulmonary bypass may be necessary. The patient has been advised of a variety of complications that might develop including but not limited to risks of death, stroke, paravalvular leak, aortic dissection or other major vascular complications, aortic annulus rupture, device embolization, cardiac rupture or perforation, mitral regurgitation, acute myocardial infarction, arrhythmia, heart block or bradycardia requiring permanent pacemaker placement, congestive heart failure, respiratory failure, renal failure, pneumonia, infection, other late complications related to structural valve deterioration or migration, or other complications that might ultimately cause a temporary or permanent loss of functional independence or other long term morbidity. The patient provides full informed consent for the procedure as described and all questions were answered.  ? ?  ? ?DETAILS OF THE OPERATIVE PROCEDURE ? ?PREPARATION:   ? ?The patient was brought to the operating room on the above mentioned date and appropriate monitoring was established by the anesthesia team. The patient was placed in the supine position on the operating table.  Intravenous antibiotics were administered. The patient was monitored closely throughout the procedure under conscious sedation.  ? ?Baseline transthoracic echocardiogram was performed. The patient's abdomen and both groins were prepped and draped in a sterile manner. A time out procedure was performed. ? ? ?PERIPHERAL ACCESS:   ? ?Using the modified  Seldinger technique, femoral arterial and venous access was obtained with placement of 6 Fr sheaths on the left side.  A pigtail diagnostic catheter was passed through the left arterial sheath under fluoroscopic guidance into the aortic root.  A temporary transvenous pacemaker catheter was passed through the left femoral venous sheath under fluoroscopic guidance into the right ventricle.  The pacemaker was tested to ensure stable lead placement and pacemaker capture. Aortic root angiography was performed in order to determine the optimal angiographic angle for valve deployment. ? ? ?TRANSFEMORAL ACCESS:  ? ?Percutaneous transfemoral access and sheath placement was performed using ultrasound guidance.  The right common femoral artery was cannulated using a micropuncture needle and appropriate location was verified using hand injection angiogram.  A pair of Abbott Perclose percutaneous closure devices were placed and a 6 French sheath replaced into the femoral artery.  The patient was heparinized systemically and ACT verified > 250 seconds.   ? ?A 14 Fr transfemoral E-sheath was introduced into the right common femoral artery after progressively dilating over an Amplatz superstiff wire. An AL-2 catheter was used to direct a straight-tip exchange length wire across the native aortic valve into the left ventricle. This was exchanged out for a pigtail catheter and position was confirmed in the LV apex.  The pigtail catheter was exchanged for a Safari wire in the LV apex.   ? ?BALLOON AORTIC VALVULOPLASTY:  ? ?Not performed.  ? ? ?TRANSCATHETER HEART VALVE DEPLOYMENT:  ? ?An Edwards Sapien 3 Ultra transcatheter heart valve (size 26 mm) was prepared and crimped per manufacturer's guidelines, and the proper orientation of the valve is confirmed on the [employer] delivery system. The valve was advanced through the introducer sheath using normal technique until in an appropriate position in the abdominal aorta beyond  the sheath tip. The balloon was then retracted and using the fine-tuning wheel was centered on the valve. The valve was then advanced across the aortic arch using appropriate flexion of the catheter. The valve was

## 2022-04-23 NOTE — Progress Notes (Signed)
?  Echocardiogram ?2D Echocardiogram has been performed. ? ?Miguel Garcia ?04/23/2022, 2:32 PM ?

## 2022-04-23 NOTE — Anesthesia Postprocedure Evaluation (Signed)
Anesthesia Post Note ? ?Patient: [name] ? ?Procedure(s) Performed: Transcatheter Aortic Valve Replacement, Transfemoral ?INTRAOPERATIVE TRANSTHORACIC ECHOCARDIOGRAM ? ?  ? ?Patient location during evaluation: PACU ?Anesthesia Type: MAC ?Level of consciousness: awake and alert ?Pain management: pain level controlled ?Vital Signs Assessment: post-procedure vital signs reviewed and stable ?Respiratory status: spontaneous breathing, nonlabored ventilation, respiratory function stable and patient connected to nasal cannula oxygen ?Cardiovascular status: stable and blood pressure returned to baseline ?Postop Assessment: no apparent nausea or vomiting ?Anesthetic complications: no ? ? ?There were no known notable events for this encounter. ? ?Last Vitals:  ?Vitals:  ? 04/23/22 1625 04/23/22 1631  ?BP: 104/65 107/62  ?Pulse: 69 67  ?Resp: 13 12  ?Temp: 36.4 ?C   ?SpO2: 98% 98%  ?  ?Last Pain:  ?Vitals:  ? 04/23/22 1625  ?TempSrc: Axillary  ?PainSc:   ? ? ?  ?  ?  ?  ?  ?  ? ?Effie Berkshire ? ? ? ? ?

## 2022-04-23 NOTE — Op Note (Signed)
?[facility]   ?MULTIDISCIPLINARY HEART VALVE TEAM ? ? ?TAVR OPERATIVE NOTE ? ? ?Date of Procedure:  04/23/2022 ? ?Preoperative Diagnosis: Severe Aortic Stenosis  ? ?Postoperative Diagnosis: Same  ? ?Procedure:  ? ?Transcatheter Aortic Valve Replacement - Percutaneous  Transfemoral Approach ? Edwards Sapien 3 Ultra Resilia THV (size 26 mm, serial # [id number]) ?  ?Co-Surgeons:  Gaye Pollack, MD and Sherren Mocha, MD ? ?Anesthesiologist:  Suella Broad, MD ? ?Echocardiographer:  Jenkins Rouge, MD ? ?Pre-operative Echo Findings: ?Severe aortic stenosis ?Moderate left ventricular systolic dysfunction ?Severe MR ? ?Post-operative Echo Findings: ?No paravalvular leak ?Unchanged left ventricular systolic function ?Severe MR (unchanged) ? ?BRIEF CLINICAL NOTE AND INDICATIONS FOR SURGERY ? ?76 year old gentleman with extensive coronary artery disease, ischemic cardiomyopathy, severe nonrheumatic mitral insufficiency, and severe aortic stenosis.  The patient has a history of prior CABG and multiple PCI procedures.  The patient is evaluated with preoperative cardiac catheterization studies and imaging studies.  His case was reviewed with the multidisciplinary heart valve team with plans to proceed with TAVR and consider transcatheter edge-to-edge repair of the mitral valve if he remains symptomatic following successful TAVR. ? ?During the course of the patient's preoperative work up they have been evaluated comprehensively by a multidisciplinary team of specialists coordinated through the [facility] in the [facility].  They have been demonstrated to suffer from symptomatic severe aortic stenosis as noted above. The patient has been counseled extensively as to the relative risks and benefits of all options for the treatment of severe aortic stenosis including long term medical therapy, conventional surgery for aortic valve replacement, and transcatheter aortic  valve replacement.  The patient has been independently evaluated in formal cardiac surgical consultation by Dr Cyndia Bent, who deemed the patient appropriate for TAVR. Based upon review of all of the patient's preoperative diagnostic tests they are felt to be candidate for transcatheter aortic valve replacement using the transfemoral approach as an alternative to conventional surgery.   ? ?Following the decision to proceed with transcatheter aortic valve replacement, a discussion has been held regarding what types of management strategies would be attempted intraoperatively in the event of life-threatening complications, including whether or not the patient would be considered a candidate for the use of cardiopulmonary bypass and/or conversion to open sternotomy for attempted surgical intervention.  The patient has been advised of a variety of complications that might develop peculiar to this approach including but not limited to risks of death, stroke, paravalvular leak, aortic dissection or other major vascular complications, aortic annulus rupture, device embolization, cardiac rupture or perforation, acute myocardial infarction, arrhythmia, heart block or bradycardia requiring permanent pacemaker placement, congestive heart failure, respiratory failure, renal failure, pneumonia, infection, other late complications related to structural valve deterioration or migration, or other complications that might ultimately cause a temporary or permanent loss of functional independence or other long term morbidity.  The patient provides full informed consent for the procedure as described and all questions were answered preoperatively. ? ?DETAILS OF THE OPERATIVE PROCEDURE ? ?PREPARATION:   ?The patient is brought to the operating room on the above mentioned date and central monitoring was established by the anesthesia team including placement of a radial arterial line. The patient is placed in the supine position on the  operating table.  Intravenous antibiotics are administered. The patient is monitored closely throughout the procedure under conscious sedation. ? ?Baseline transthoracic echocardiogram is performed. The patient's chest, abdomen, both groins, and both lower  extremities are prepared and draped in a sterile manner. A time out procedure is performed. ? ? ?PERIPHERAL ACCESS:   ?Using ultrasound guidance, femoral arterial and venous access is obtained with placement of 6 Fr sheaths on the left side.  [place] images are digitally captured and stored in the patient's chart. A pigtail diagnostic catheter was passed through the femoral arterial sheath under fluoroscopic guidance into the aortic root.  A temporary transvenous pacemaker catheter was passed through the femoral venous sheath under fluoroscopic guidance into the right ventricle.  The pacemaker was tested to ensure stable lead placement and pacemaker capture. Aortic root angiography was performed in order to determine the optimal angiographic angle for valve deployment. ? ?TRANSFEMORAL ACCESS:  ?A micropuncture technique is used to access the right femoral artery under fluoroscopic and ultrasound guidance.  2 Perclose devices are deployed at 10' and 2' positions to 'PreClose' the femoral artery. An 8 French sheath is placed and then an Amplatz Superstiff wire is advanced through the sheath. This is changed out for a 14 French transfemoral E-Sheath after progressively dilating over the Superstiff wire.  An AL-2 catheter was used to direct a straight-tip exchange length wire across the native aortic valve into the left ventricle. This was exchanged out for a pigtail catheter and position was confirmed in the LV apex. Simultaneous LV and Ao pressures were recorded.  The pigtail catheter was exchanged for a safari wire in the LV apex.   ? ?BALLOON AORTIC VALVULOPLASTY:  ?Not performed ? ?TRANSCATHETER HEART VALVE DEPLOYMENT:  ?An Edwards Sapien 3 transcatheter heart valve  (size 26 mm) was prepared and crimped per manufacturer's guidelines, and the proper orientation of the valve is confirmed on the [employer] delivery system. The valve was advanced through the introducer sheath using normal technique until in an appropriate position in the abdominal aorta beyond the sheath tip. The balloon was then retracted and using the fine-tuning wheel was centered on the valve. The valve was then advanced across the aortic arch using appropriate flexion of the catheter. The valve was carefully positioned across the aortic valve annulus. The Commander catheter was retracted using normal technique. Once final position of the valve has been confirmed by angiographic assessment, the valve is deployed while temporarily holding ventilation and during rapid ventricular pacing to maintain systolic blood pressure < 50 mmHg and pulse pressure < 10 mmHg. The balloon inflation is held for >3 seconds after reaching full deployment volume. Once the balloon has fully deflated the balloon is retracted into the ascending aorta and valve function is assessed using echocardiography. The patient's hemodynamic recovery following valve deployment is good.  The deployment balloon and guidewire are both removed. Echo demostrated acceptable post-procedural gradients, stable mitral valve function, and no aortic insufficiency.  ? ? ?PROCEDURE COMPLETION:  ?The sheath was removed and femoral artery closure is performed using the 2 previously deployed Perclose devices.  Protamine is administered once femoral arterial repair was complete. The site is clear with no evidence of bleeding or hematoma after the sutures are tightened. The temporary pacemaker and pigtail catheters are removed. Mynx closure is used for contralateral femoral arterial hemostasis for the 6 Fr sheath. ? ?The patient tolerated the procedure well and is transported to the recovery area in stable condition. There were no immediate intraoperative  complications. All sponge instrument and needle counts are verified correct at completion of the operation.  ? ?The patient received a total of 40 mL of intravenous contrast during the procedure. ? ? ?  Elesa Massed

## 2022-04-24 ENCOUNTER — Inpatient Hospital Stay (HOSPITAL_BASED_OUTPATIENT_CLINIC_OR_DEPARTMENT_OTHER)
Admission: RE | Admit: 2022-04-24 | Discharge: 2022-04-24 | Disposition: A | Discharge status: Home / Self Care | Source: Home / Self Care | Attending: Physician Assistant | Admitting: Physician Assistant

## 2022-04-24 ENCOUNTER — Encounter (HOSPITAL_COMMUNITY): Payer: Self-pay | Admitting: Cardiovascular Disease

## 2022-04-24 ENCOUNTER — Other Ambulatory Visit: Payer: Self-pay | Admitting: Physician Assistant

## 2022-04-24 ENCOUNTER — Inpatient Hospital Stay (HOSPITAL_COMMUNITY)

## 2022-04-24 DIAGNOSIS — Z952 Presence of prosthetic heart valve: Secondary | ICD-10-CM

## 2022-04-24 DIAGNOSIS — I447 Left bundle-branch block, unspecified: Secondary | ICD-10-CM

## 2022-04-24 DIAGNOSIS — I350 Nonrheumatic aortic (valve) stenosis: Principal | ICD-10-CM

## 2022-04-24 LAB — ECHOCARDIOGRAM COMPLETE
AR max vel: 1.81 cm2
AV Area VTI: 1.84 cm2
AV Area mean vel: 1.91 cm2
AV Mean grad: 5.5 mmHg
AV Peak grad: 10.2 mmHg
Ao pk vel: 1.6 m/s
Area-P 1/2: 4.8 cm2
Calc EF: 55.4 %
Height: 69 in
MV M vel: 4.57 m/s
MV Peak grad: 83.5 mmHg
S' Lateral: 3.8 cm
Single Plane A2C EF: 54 %
Single Plane A4C EF: 55.5 %
Weight: 2934.76 oz

## 2022-04-24 LAB — CBC
HCT: 34.4 % — ABNORMAL LOW (ref 39.0–52.0)
Hemoglobin: 11.7 g/dL — ABNORMAL LOW (ref 13.0–17.0)
MCH: 31.5 pg (ref 26.0–34.0)
MCHC: 34 g/dL (ref 30.0–36.0)
MCV: 92.7 fL (ref 80.0–100.0)
Platelets: 150 10*3/uL (ref 150–400)
RBC: 3.71 MIL/uL — ABNORMAL LOW (ref 4.22–5.81)
RDW: 13.5 % (ref 11.5–15.5)
WBC: 5.9 10*3/uL (ref 4.0–10.5)
nRBC: 0 % (ref 0.0–0.2)

## 2022-04-24 LAB — BASIC METABOLIC PANEL
Anion gap: 8 (ref 5–15)
BUN: 16 mg/dL (ref 8–23)
CO2: 20 mmol/L — ABNORMAL LOW (ref 22–32)
Calcium: 7.8 mg/dL — ABNORMAL LOW (ref 8.9–10.3)
Chloride: 109 mmol/L (ref 98–111)
Creatinine, Ser: 0.96 mg/dL (ref 0.61–1.24)
GFR, Estimated: >60 mL/min (ref >60)
Glucose, Bld: 132 mg/dL — ABNORMAL HIGH (ref 70–99)
Potassium: 4 mmol/L (ref 3.5–5.1)
Sodium: 137 mmol/L (ref 135–145)

## 2022-04-24 LAB — GLUCOSE, CAPILLARY: Glucose-Capillary: 127 mg/dL — ABNORMAL HIGH (ref 70–99)

## 2022-04-24 LAB — URINALYSIS, ROUTINE W REFLEX MICROSCOPIC
Bilirubin Urine: NEGATIVE
Glucose, UA: 50 mg/dL — AB
Hgb urine dipstick: NEGATIVE
Ketones, ur: NEGATIVE mg/dL
Leukocytes,Ua: NEGATIVE
Nitrite: NEGATIVE
Protein, ur: NEGATIVE mg/dL
Specific Gravity, Urine: 1.014 (ref 1.005–1.030)
pH: 5 (ref 5.0–8.0)

## 2022-04-24 LAB — MAGNESIUM: Magnesium: 1.8 mg/dL (ref 1.7–2.4)

## 2022-04-24 NOTE — Plan of Care (Signed)
?  Problem: Clinical Measurements: ?Goal: Will remain free from infection ?Outcome: Progressing ?  ?

## 2022-04-24 NOTE — Progress Notes (Signed)
Hospital applied ? ?Dr. Claiborne Billings to read. ?

## 2022-04-24 NOTE — Discharge Summary (Addendum)
?HEART AND VASCULAR CENTER   ?MULTIDISCIPLINARY HEART VALVE TEAM ? ?Discharge Summary  ?  ?Patient ID: Miguel Garcia ?MRN: [id number]; DOB: 1946/02/01 ? ?Admit date: 04/23/2022 ?Discharge date: 04/24/2022 ? ?Primary Care Provider: Patient, No Pcp Per (Inactive)  ?Primary Cardiologist: Shelva Majestic, MD / Dr. Burt Knack & Dr. Cyndia Bent (TAVR) ? ?Discharge Diagnoses  ?  ?Principal Problem: ?  S/P TAVR (transcatheter aortic valve replacement) ?Active Problems: ?  Hx of CABG ?  CAD S/P percutaneous coronary angioplasty ?  History of prostate cancer ?  Dyslipidemia, goal LDL below 70 ?  Incarceration ?  Essential hypertension ?  Non-insulin dependent type 2 diabetes mellitus (Miguel Garcia) ?  Nonrheumatic aortic valve stenosis ?  Iron deficiency anemia secondary to blood loss (chronic) ?  Nonrheumatic mitral valve regurgitation ?  Type II diabetes mellitus ([place]) ? ? ?Allergies ?Allergies  ?Allergen Reactions  ? Lisinopril Cough  ? ? ?Diagnostic Studies/Procedures  ?  ?TAVR OPERATIVE NOTE ?  ?  ?Date of Procedure:                04/23/2022 ?  ?Preoperative Diagnosis:      Severe Aortic Stenosis  ?  ?Postoperative Diagnosis:    Same  ?  ?Procedure:      ?  ?Transcatheter Aortic Valve Replacement - Percutaneous Right Transfemoral Approach ?            Edwards Sapien 3 Ultra ResiliaTHV (size 26 mm, model # 9755RSL, serial # [id number]) ?             ?Co-Surgeons:                        Gaye Pollack, MD and Sherren Mocha, MD   ?  ?  ?Anesthesiologist:                  Suella Broad, MD ?  ?Echocardiographer:              Jenkins Rouge, MD ?  ?Pre-operative Echo Findings: ?Severe aortic stenosis ?Normal left ventricular systolic function ?Severe mitral regurgitation ?  ?Post-operative Echo Findings: ?No paravalvular leak ?Normal left ventricular systolic function ?Unchanged severe mitral regurgitation ?_____________ ?  ? ?Echo 04/24/2022: completed but pending formal read at the time of discharge  ? ?History of Present Illness   ?  ?Miguel Garcia is a  76 y.o. male with a history of ischemic heart disease with multiple PCIs and ultimately treated with multivessel CABG 2007 [facility]), HLD, HTN, Prostate CA, T2DM, and severe aortic stenosis as well as primary mitral regurgitation who presented to [facility] on 04/23/22 for planned TAVR.  ? ?An echo in April 2022 showed a trileaflet aortic valve with severe thickening and calcification.  The mean gradient was measured at 26 mmHg with a peak gradient of 45 mmHg.  Valve area was 0.92 cm?.  Dimensionless index was 0.24 with a stroke-volume index of 31.  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  A TEE was done on 08/23/2021 which showed severe calcification of the aortic valve with a mean gradient of 30 mmHg and a valve area of 0.7 cm? by VTI.  There was severe eccentric mitral valve regurgitation with a flail P2 segment of the posterior leaflet.  There was a small mobile lesion on the atrial surface of the mitral valve that was suspicious for possible old  endocarditis. There was pulmonary vein blunting and systolic reversal noted consistent with severe regurgitation.  Left ventricular ejection fraction was 55 to 60%.  He subsequent underwent cardiac catheterization on 02/01/2022 showing multivessel coronary disease with a patent LIMA to the LAD and continued patency of the left main and left circumflex stents with mild to moderate in-stent restenosis.  There was continued patency of the RCA with mild stenosis. There was severe aortic stenosis with a mean gradient across the valve of 43 mmHg and a peak to peak gradient of 51 mmHg.  Calculated valve area was 0.77 cm?Miguel Garcia There was moderate pulmonary hypertension with a PA pressure of 59/22 with a mean of 36 and a large V wave of 24 mmHg consistent with severe mitral regurgitation. ? ?The patient has been evaluated by the multidisciplinary valve team and  felt to have severe, symptomatic aortic stenosis and to be a suitable candidate for TAVR, which was set up for 04/23/22.  ? ?Hospital Course  ?   ?Consultants: none  ? ?Severe AS: s/p successful TAVR with a 26 mm Edwards Sapien 3 Ultra Resilia THV via the TF approach on 04/23/22. Post operative echo completed but pending formal read. Groin sites are stable. ECG with new LBBB but no high grade heart block. Continue on chronic Plaivx therapy. Plan for discharge home today with follow up in the office next week. ? ?New LBBB: will place a Zio at discharge to rule out delayed HAVB.  ? ?Severe MR: murmur is loud on exam. We will continue to follow. He could be a potential clip candidate in the future  ? ?CAD s/p CABG: no chest pain. Pre TAVR cath showed multivessel coronary artery disease with continued patency of the LIMA to LAD graft, continued patency of the left main and left circumflex stents with mild to moderate in-stent restenosis, and continued patency of the RCA with mild stenosis noted. Continue medical therapy with Plavix, BB and statin.  ? ?HTN: resume home meds.  ? ?Dysuria: will get a UA and if abnormal will call in empiric abx.  ? ?_____________ ? ?Discharge Vitals ?Blood pressure (!) 149/73, pulse 98, temperature 98.8 ?F (37.1 ?C), temperature source Oral, resp. rate 16, height '5\' 9"'$  (1.753 m), weight 83.2 kg, SpO2 98 %.  ?Filed Weights  ? 04/23/22 0857 04/24/22 0500  ?Weight: 83.9 kg 83.2 kg  ? ? ?GEN: Well nourished, well developed, in no acute distress ?HEENT: normal ?Neck: no JVD or masses ?Cardiac: RRR; 3/6 flow murmurs @ RUSB. 4/6 holosystolic murmur at apex. No rubs, or gallops,no edema  ?Respiratory:  clear to auscultation bilaterally, normal work of breathing ?GI: soft, nontender, nondistended, + BS ?MS: no deformity or atrophy ?Skin: warm and dry, no rash ?Neuro:  Alert and Oriented x 3, Strength and sensation are intact ?Psych: euthymic mood, full affect ? ? ?Labs & Radiologic Studies  ?   ?CBC ?Recent Labs  ?  04/23/22 ?1038 04/23/22 ?1307 04/23/22 ?1453 04/24/22 ?0139  ?WBC 5.3  --   --  5.9  ?HGB 13.5   < > 11.6* 11.7*  ?HCT 40.2   < > 34.0* 34.4*  ?MCV 92.0  --   --  92.7  ?PLT 170  --   --  150  ? < > = values in this interval not displayed.  ? ?Basic Metabolic Panel ?Recent Labs  ?  04/23/22 ?1038 04/23/22 ?1307 04/23/22 ?1453 04/24/22 ?0139  ?NA 138   < > 140 137  ?K 4.2   < >  3.9 4.0  ?CL 107   < > 106 109  ?CO2 23  --   --  20*  ?GLUCOSE 115*   < > 129* 132*  ?BUN 16   < > 14 16  ?CREATININE 0.90   < > 0.60* 0.96  ?CALCIUM 8.6*  --   --  7.8*  ?MG  --   --   --  1.8  ? < > = values in this interval not displayed.  ? ?Liver Function Tests ?Recent Labs  ?  04/23/22 ?1038  ?AST 18  ?ALT 12  ?ALKPHOS 107  ?BILITOT 0.7  ?PROT 7.1  ?ALBUMIN 3.7  ? ?No results for input(s): LIPASE, AMYLASE in the last 72 hours. ?Cardiac Enzymes ?No results for input(s): CKTOTAL, CKMB, CKMBINDEX, TROPONINI in the last 72 hours. ?BNP ?Invalid input(s): POCBNP ?D-Dimer ?No results for input(s): DDIMER in the last 72 hours. ?Hemoglobin A1C ?No results for input(s): HGBA1C in the last 72 hours. ?Fasting Lipid Panel ?No results for input(s): CHOL, HDL, LDLCALC, TRIG, CHOLHDL, LDLDIRECT in the last 72 hours. ?Thyroid Function Tests ?No results for input(s): TSH, T4TOTAL, T3FREE, THYROIDAB in the last 72 hours. ? ?Invalid input(s): FREET3 ?_____________  ?DG Chest 2 View ? ?Result Date: 04/23/2022 ?CLINICAL DATA:  Preoperative heart surgery EXAM: CHEST - 2 VIEW COMPARISON:  08/14/2018 FINDINGS: Cardiomegaly status post median sternotomy. Both lungs are clear. Numerous calcified bilateral pleural plaques. Disc degenerative disease of the thoracic spine. IMPRESSION: 1. Cardiomegaly without acute abnormality of the lungs. 2. Calcified bilateral pleural plaques, consistent with asbestos pleural disease. Electronically Signed   By: Delanna Ahmadi M.D.   On: 04/23/2022 16:20  ? ?ECHOCARDIOGRAM LIMITED ? ?Result Date: 04/23/2022 ?    ECHOCARDIOGRAM LIMITED REPORT   Patient Name:   ENES ROKOSZ Date of Exam: 04/23/2022 Medical Rec #:  [id number]      Height:       69.0 in Accession #:    [id number]     Weight:       185.0 lb Date of Birth:  [birth year]

## 2022-04-24 NOTE — Progress Notes (Signed)
CARDIAC REHAB PHASE I  ? ?PRE:  Rate/Rhythm: 88 SR ? ?BP:  Sitting: 132/68     ? ?SaO2: 97 RA ? ?MODE:  Ambulation: 250 ft  ? ?POST:  Rate/Rhythm: 102 ST ? ?BP:  Sitting: 143/71 ? ?  SaO2: 96 RA ? ? ?Pt agreeable to ambulation. Pt ambulated 242f in hallway assist of one with front wheel walker. Pt assisted to BR than returned to bed. Pt educated on site care, restrictions, and encouraged continued ambulation as able. Guards at bedside.  ? ?[phone]?TRufina Falco RN BSN ?04/24/2022 ?10:24 AM ? ?

## 2022-04-24 NOTE — TOC Transition Note (Signed)
Transition of Care (TOC) - CM/SW Discharge Note ?Marvetta Gibbons Therapist, sports, BSN ?Transitions of Care ?Unit 4E- RN Case Manager ?See Treatment Team for direct phone #  ? ? ?Patient Details  ?Name: Miguel Garcia ?MRN: [id number] ?Date of Birth: [birth year] ? ?Transition of Care (TOC) CM/SW Contact:  ?Dahlia Client, Romeo Rabon, RN ?Phone Number: ?04/24/2022, 12:56 PM ? ? ?Clinical Narrative:    ?Pt s/p TAVR, stable for transition back to corrections facility where he is an inmate. Officers at the bedside. Transition of Care Department [facility]) has reviewed patient and no TOC needs have been identified at this time.  ? ?Final next level of care: [place] ?Barriers to Discharge: No Barriers Identified ? ? ?Patient Goals and CMS Choice ?  ?  ?Choice offered to / list presented to : NA ? ?Discharge Placement ?  ?           ? Corrections facility ?  ?  ?  ? ?Discharge Plan and Services ?  ?Discharge Planning Services: NA ?Post Acute Care Choice: NA          ?DME Arranged: N/A ?DME Agency: NA ?  ?  ?  ?HH Arranged: NA ?[place] Agency: NA ?  ?  ?  ? ?Social Determinants of Health (SDOH) Interventions ?  ? ? ?Readmission Risk Interventions ? ?  04/24/2022  ? 12:56 PM  ?Readmission Risk Prevention Plan  ?Post Dischage Appt Complete  ?Medication Screening Complete  ?Transportation Screening Complete  ? ? ? ? ? ?

## 2022-04-25 ENCOUNTER — Telehealth: Payer: Self-pay | Admitting: *Deleted

## 2022-04-25 ENCOUNTER — Telehealth: Payer: Self-pay | Admitting: Cardiology

## 2022-04-25 NOTE — Telephone Encounter (Signed)
Meredith from [facility] called regarding ZIO AT malfunction.  She discussed this with Angelena Form, yesterday, but customer care had not been updated.  The correctional officer has been contacted.  Poor cell phone coverage in prison.  Patient will be brought to area on regular intervals for data from ZIO patch to transmit to [facility] in area where there is cell phone coverage. ?

## 2022-04-25 NOTE — Telephone Encounter (Signed)
Email received  04/24/22 2112 ?Hello, ?This email is to notify you that the patient listed below had an early  issue where the gateway is rapid flashing. Patient has been instructerd to keep the patch on.  ? ?Can you please confirm which of the following ways you would like to proceed so we can move forward accordingly? ? ? I will call the patient and have him repatched in clinic. I will contact you if a replacement is necessary. ? I would like you to ship a replacement immediately to the patient. ? ?If you have any questions, please respond to this email, or call us at [phone].  ? ?Patient initials: D.O. ?Patient ID: [id number] ?SN: [id number] ?Ticket: [id number] ? ? Account: [place] ? ?Thank you, ?[employer] Customer Care ? ? In response to ticket # [id number], please ship a replacement immediately to the patient. Monitor was applied to the patient at discharge from the hospital.  Patient is in a correctional facilty.  Please call [phone], to obtain shipping contact for medical department.  Address is [employer], [address] [place] , [place]  [address] ? ?Thank you, ?Mckynzie Liwanag/ [employer] ?[place] ?[phone] ? ?Email sent to [email or web address] and [email or web address] ? ?  ?

## 2022-04-25 NOTE — Telephone Encounter (Signed)
?  HEART AND VASCULAR CENTER   ?MULTIDISCIPLINARY HEART VALVE TEAM  ? ?Patient contacted regarding discharge from [facility] on 04/24/22 s/p TAVR ?  ?Patient understands to follow up with provider Nell Range at the [facility] to the charge nurse at his correctional facility who states that he has been well with no apparent issues at this time.   ?Patient understands discharge instructions? Yes  ?Patient understands medications and regimen? Yes  ?Patient understands to bring all medications to this visit? Yes  ? ?Kathyrn Drown NP-C ?Structural Heart Team  ?Pager: [phone] ? ?  ? ?

## 2022-05-03 ENCOUNTER — Ambulatory Visit (INDEPENDENT_AMBULATORY_CARE_PROVIDER_SITE_OTHER): Admitting: Physician Assistant

## 2022-05-03 VITALS — BP 116/64 | HR 77 | Ht 68.0 in | Wt 185.8 lb

## 2022-05-03 DIAGNOSIS — I340 Nonrheumatic mitral (valve) insufficiency: Secondary | ICD-10-CM | POA: Diagnosis not present

## 2022-05-03 DIAGNOSIS — Z951 Presence of aortocoronary bypass graft: Secondary | ICD-10-CM | POA: Diagnosis not present

## 2022-05-03 DIAGNOSIS — I447 Left bundle-branch block, unspecified: Secondary | ICD-10-CM

## 2022-05-03 DIAGNOSIS — I10 Essential (primary) hypertension: Secondary | ICD-10-CM

## 2022-05-03 DIAGNOSIS — Z952 Presence of prosthetic heart valve: Secondary | ICD-10-CM | POA: Diagnosis not present

## 2022-05-03 MED ORDER — AMOXICILLIN 500 MG PO TABS: 2000.0000 mg | ORAL_TABLET | ORAL | 12 refills | Status: AC

## 2022-05-03 NOTE — Progress Notes (Signed)
?HEART AND VASCULAR CENTER   ?[place] ?                                    ?Cardiology Office Note:   ? ?Date:  05/06/2022  ? ?ID:  Miguel Garcia, DOB 1946-04-13, MRN [id number] ? ?PCP:  Patient, No Pcp Per (Inactive)  ?[facility] Cardiologist:  Shelva Majestic, MD / Dr. Burt Knack & Dr. Cyndia Bent (TAVR) ?[place] Electrophysiologist:  None  ? ?Referring MD: No ref. provider found  ? ?TOC s/p TAVR ? ?History of Present Illness:   ? ?Miguel Garcia is a 76 y.o. male with a hx of ischemic heart disease with multiple PCIs and ultimately treated with multivessel CABG 2007 [facility]), HLD, HTN, Prostate CA, T2DM, and severe aortic stenosis as well as primary mitral regurgitation s/p TAVR (04/23/22) who presents to clinic for follow up.  ? ?An echo in April 2022 showed a trileaflet aortic valve with severe thickening and calcification.  The mean gradient was measured at 26 mmHg with a peak gradient of 45 mmHg.  Valve area was 0.92 cm?.  Dimensionless index was 0.24 with a stroke-volume index of 31.  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  A TEE was done on 08/23/2021 which showed severe calcification of the aortic valve with a mean gradient of 30 mmHg and a valve area of 0.7 cm? by VTI.  There was severe eccentric mitral valve regurgitation with a flail P2 segment of the posterior leaflet.  There was a small mobile lesion on the atrial surface of the mitral valve that was suspicious for possible old endocarditis. There was pulmonary vein blunting and systolic reversal noted consistent with severe regurgitation.  Left ventricular ejection fraction was 55 to 60%.  He subsequent underwent cardiac catheterization on 02/01/2022 showing multivessel coronary disease with a patent LIMA to the LAD and continued patency of the left main and left circumflex stents with mild to  moderate in-stent restenosis.  There was continued patency of the RCA with mild stenosis. There was severe aortic stenosis with a mean gradient across the valve of 43 mmHg and a peak to peak gradient of 51 mmHg.  Calculated valve area was 0.77 cm?Marland Kitchen There was moderate pulmonary hypertension with a PA pressure of 59/22 with a mean of 36 and a large V wave of 24 mmHg consistent with severe mitral regurgitation. ? ?He was evaluated by the multidisciplinary valve team and underwent successful TAVR with a 26 mm Edwards Sapien 3 Ultra Resilia THV via the TF approach on 04/23/22. Post operative echo showed EF 55%, normally functioning TAVR with a mean gradient of 5.5 mmHg and no PVL as well as severe MR- eccentric anteriorly directed MR with splay artifact likely posterior leaflet prolapse. He was discharged on his chronic plavix. Given new LBBB he was discharged with a Zio AT but this never functioned correctly and was sent back to the company. He has some dysuria but UA was normal.  ? ?Today the patient presents to clinic for follow up. No CP or SOB. No LE edema, orthopnea or PND. No dizziness or syncope. No blood in stool or urine. No palpitations. Improvement in energy and no having to take as many naps.  ? ?Past Medical History:  ?Diagnosis Date  ? Anxiety   ?  Arthritis   ? "hands, lower back,; L4-5;  neck; C1-2" (03/31/2018)  ? 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 "of my lungs"  ? Asthma   ? CAD S/P percutaneous coronary angioplasty 03/31/2018  ? LM PCI  ? Chronic lower back pain   ? High cholesterol   ? Hypertension   ? Mitral regurgitation   ? Prostate cancer ([place])   ? "put 3 beamers in my prostate in 11/2017 & radiation completed in 02/2018" (03/31/2018)  ? Severe aortic stenosis   ? Type II diabetes mellitus ([place])   ? ? ?Past Surgical History:  ?Procedure Laterality Date  ? BACK SURGERY    ? BUBBLE STUDY  08/23/2021  ? Procedure: BUBBLE STUDY;  Surgeon: [name], MD;  Location: [facility];  Service: Cardiology;;  ? CORONARY  ANGIOPLASTY WITH STENT PLACEMENT  03/31/2018  ? CORONARY ARTERY BYPASS GRAFT  2007  ? CORONARY BALLOON ANGIOPLASTY N/A 08/17/2018  ? Procedure: CORONARY BALLOON ANGIOPLASTY;  Surgeon: Troy Sine, MD;  Location: [place] [facility];  Service: Cardiovascular;  Laterality: N/A;  ? CORONARY STENT INTERVENTION N/A 03/31/2018  ? Procedure: CORONARY STENT INTERVENTION;  Surgeon: Troy Sine, MD;  Location: [place] [facility];  Service: Cardiovascular;  Laterality: N/A;  ? CORONARY STENT INTERVENTION N/A 08/17/2018  ? Procedure: CORONARY STENT INTERVENTION;  Surgeon: Troy Sine, MD;  Location: [place] [facility];  Service: Cardiovascular;  Laterality: N/A;  ? FOREARM FRACTURE SURGERY Right   ? FRACTURE SURGERY    ? HEMORRHOID BANDING    ? INTRAOPERATIVE TRANSTHORACIC ECHOCARDIOGRAM N/A 04/23/2022  ? Procedure: INTRAOPERATIVE TRANSTHORACIC ECHOCARDIOGRAM;  Surgeon: Sherren Mocha, MD;  Location: [place] [facility];  Service: Open Heart Surgery;  Laterality: N/A;  ? LAPAROSCOPIC CHOLECYSTECTOMY    ? LEFT HEART CATH AND CORONARY ANGIOGRAPHY N/A 03/31/2018  ? Procedure: LEFT HEART CATH AND CORONARY ANGIOGRAPHY;  Surgeon: Troy Sine, MD;  Location: [place] [facility];  Service: Cardiovascular;  Laterality: N/A;  ? LEFT HEART CATH AND CORS/GRAFTS ANGIOGRAPHY N/A 08/17/2018  ? Procedure: LEFT HEART CATH AND CORS/GRAFTS ANGIOGRAPHY;  Surgeon: Troy Sine, MD;  Location: [place] [facility];  Service: Cardiovascular;  Laterality: N/A;  ? ORIF SHOULDER FRACTURE Left   ? POSTERIOR FUSION LUMBAR SPINE    ? L4-5  ? PROSTATE BIOPSY    ? "put in [place] [employer] in 11/2017"  ? RIGHT/LEFT HEART CATH AND CORONARY/GRAFT ANGIOGRAPHY N/A 02/01/2022  ? Procedure: RIGHT/LEFT HEART CATH AND CORONARY/GRAFT ANGIOGRAPHY;  Surgeon: Sherren Mocha, MD;  Location: [place] [facility];  Service: Cardiovascular;  Laterality: N/A;  ? TEE WITHOUT CARDIOVERSION N/A 08/23/2021  ? Procedure: TRANSESOPHAGEAL ECHOCARDIOGRAM (TEE);   Surgeon: [name], MD;  Location: [place];  Service: Cardiology;  Laterality: N/A;  ? TRANSCATHETER AORTIC VALVE REPLACEMENT, TRANSFEMORAL N/A 04/23/2022  ? Procedure: Transcatheter Aortic Valve Replacement, Transfemoral;  Surgeon: Sherren Mocha, MD;  Location: [facility];  Service: Open Heart Surgery;  Laterality: N/A;  ? ? ?Current Medications: ?Current Meds  ?Medication Sig  ? acetaminophen (TYLENOL) 325 MG tablet Take 650 mg by mouth 3 (three) times daily as needed for moderate pain.  ? albuterol (PROVENTIL HFA;VENTOLIN HFA) 108 (90 Base) MCG/ACT inhaler Inhale 2 puffs into the lungs 4 (four) times daily as needed for wheezing or shortness of breath.  ? albuterol (PROVENTIL) (2.5 MG/3ML) 0.083% nebulizer solution Take 2.5 mg by nebulization 4 (four) times daily as needed for wheezing or shortness of breath.  ?  amoxicillin (AMOXIL) 500 MG tablet Take 4 tablets (2,000 mg total) by mouth as directed. 1 hour prior to dental work including cleanings  ? CALCIUM CITRATE-VITAMIN D PO Take 2 tablets by mouth in the morning and at bedtime. 600 mg each  ? carvedilol (COREG) 3.125 MG tablet Take 3.125 mg by mouth 2 (two) times daily.  ? chlorpheniramine (CHLOR-TRIMETON) 4 MG tablet Take 4 mg by mouth 3 (three) times daily as needed.  ? cholecalciferol (VITAMIN D) 25 MCG (1000 UNIT) tablet Take 1,000 Units by mouth daily.  ? clopidogrel (PLAVIX) 75 MG tablet Take 75 mg by mouth daily.  ? denosumab (PROLIA) 60 MG/ML SOSY injection Inject 60 mg into the skin every 6 (six) months. UR approved until 04/43/24  ? Dextran 70-Hypromellose (ARTIFICIAL TEARS) 0.1-0.3 % SOLN Place 1 drop into both eyes 4 (four) times daily as needed (burning).  ? docusate sodium (COLACE) 100 MG capsule Take 100 mg by mouth 2 (two) times daily.  ? DULoxetine (CYMBALTA) 20 MG capsule Take 20 mg by mouth at bedtime.  ? enzalutamide (XTANDI) 40 MG capsule Take 160 mg by mouth daily.  ? furosemide (LASIX) 20 MG tablet Take 20 mg by  mouth every 3 (three) days. Every three days  ? gabapentin (NEURONTIN) 300 MG capsule Take 300 mg by mouth 3 (three) times daily.  ? insulin glargine (LANTUS) 100 UNIT/ML injection Inject 45 Units into the skin d

## 2022-05-31 ENCOUNTER — Other Ambulatory Visit: Payer: Self-pay | Admitting: Physician Assistant

## 2022-05-31 ENCOUNTER — Ambulatory Visit (INDEPENDENT_AMBULATORY_CARE_PROVIDER_SITE_OTHER): Admitting: Physician Assistant

## 2022-05-31 ENCOUNTER — Ambulatory Visit (HOSPITAL_COMMUNITY): Attending: Internal Medicine

## 2022-05-31 VITALS — BP 142/80 | HR 76 | Ht 68.0 in | Wt 186.0 lb

## 2022-05-31 DIAGNOSIS — I447 Left bundle-branch block, unspecified: Secondary | ICD-10-CM | POA: Diagnosis not present

## 2022-05-31 DIAGNOSIS — Z951 Presence of aortocoronary bypass graft: Secondary | ICD-10-CM | POA: Diagnosis not present

## 2022-05-31 DIAGNOSIS — I10 Essential (primary) hypertension: Secondary | ICD-10-CM

## 2022-05-31 DIAGNOSIS — Z952 Presence of prosthetic heart valve: Secondary | ICD-10-CM | POA: Insufficient documentation

## 2022-05-31 DIAGNOSIS — I340 Nonrheumatic mitral (valve) insufficiency: Secondary | ICD-10-CM | POA: Diagnosis not present

## 2022-05-31 LAB — ECHOCARDIOGRAM COMPLETE
AR max vel: 1.87 cm2
AV Area VTI: 1.93 cm2
AV Area mean vel: 1.86 cm2
AV Mean grad: 7.3 mmHg
AV Peak grad: 12.9 mmHg
Ao pk vel: 1.8 m/s
Area-P 1/2: 3.62 cm2
MV M vel: 5.05 m/s
MV Peak grad: 101.8 mmHg
Radius: 1 cm
S' Lateral: 3 cm

## 2022-05-31 NOTE — Patient Instructions (Signed)
Medication Instructions:  Your physician recommends that you continue on your current medications as directed. Please refer to the Current Medication list given to you today.  *If you need a refill on your cardiac medications before your next appointment, please call your pharmacy*   Lab Work: none If you have labs (blood work) drawn today and your tests are completely normal, you will receive your results only by: [place] (if you have MyChart) OR A paper copy in the mail If you have any lab test that is abnormal or we need to change your treatment, we will call you to review the results.   Testing/Procedures: none   Follow-Up: At [facility], you and your health needs are our priority.  As part of our continuing mission to provide you with exceptional heart care, we have created designated Provider Care Teams.  These Care Teams include your primary Cardiologist (physician) and Advanced Practice Providers (APPs -  Physician Assistants and Nurse Practitioners) who all work together to provide you with the care you need, when you need it.  We recommend signing up for the patient portal called "MyChart".  Sign up information is provided on this After Visit Summary.  MyChart is used to connect with patients for Virtual Visits (Telemedicine).  Patients are able to view lab/test results, encounter notes, upcoming appointments, etc.  Non-urgent messages can be sent to your provider as well.   To learn more about what you can do with MyChart, go to [email or web address].    Your next appointment:   6 month(s)  The format for your next appointment:   In Person  Provider:   Shelva Majestic, MD     Other Instructions   Important Information About [place]

## 2022-05-31 NOTE — Progress Notes (Signed)
[place]                                     Cardiology Office Note:    Date:  05/31/2022   ID:  [name], DOB [birth year], MRN [id number]  PCP:  Patient, No Pcp Per (Inactive)  [facility] Cardiologist:  Shelva Majestic, MD / Dr. Burt Knack & Dr. Cyndia Bent (TAVR) [facility] Electrophysiologist:  None   Referring MD: No ref. provider found   1 month s/p TAVR  History of Present Illness:    Trev Boley is a 76 y.o. 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   An echo in April 2022 showed a trileaflet aortic valve with severe thickening and calcification.  The mean gradient was measured at 26 mmHg with a peak gradient of 45 mmHg.  Valve area was 0.92 cm.  Dimensionless index was 0.24 with a stroke-volume index of 31.  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  A TEE was done on 08/23/2021 which showed severe calcification of the aortic valve with a mean gradient of 30 mmHg and a valve area of 0.7 cm by VTI.  There was severe eccentric mitral valve regurgitation with a flail P2 segment of the posterior leaflet.  There was a small mobile lesion on the atrial surface of the mitral valve that was suspicious for possible old endocarditis. There was pulmonary vein blunting and systolic reversal noted consistent with severe regurgitation.  Left ventricular ejection fraction was 55 to 60%.  He subsequent underwent cardiac catheterization on 02/01/2022 showing multivessel coronary disease with a patent LIMA to the LAD and continued patency of the left main and left circumflex stents with mild to  moderate in-stent restenosis.  There was continued patency of the RCA with mild stenosis. There was severe aortic stenosis with a mean gradient across the valve of 43 mmHg and a peak to peak gradient of 51 mmHg.  Calculated valve area was 0.77 cm. There was moderate pulmonary hypertension with a PA pressure of 59/22 with a mean of 36 and a large V wave of 24 mmHg consistent with severe mitral regurgitation.  He was evaluated by the multidisciplinary valve team and underwent successful TAVR with a 26 mm Edwards Sapien 3 Ultra Resilia THV via the TF approach on 04/23/22. Post operative echo showed EF 55%, normally functioning TAVR with a mean gradient of 5.5 mmHg and no PVL as well as severe MR- eccentric anteriorly directed MR with splay artifact likely posterior leaflet prolapse. He was discharged on his chronic plavix. Given new LBBB he was discharged with a [employer] AT but this never functioned correctly and was sent back to the company. He has some dysuria but UA was normal. He has done well in follow up with an improvement in energy and no having to take as many naps.   Today the patient presents to clinic for follow up. Here with guards.  He has no chest pain.  He has chronic mild dyspnea.  He is overall much improved since TAVR.  He  has an increase in energy and takes less naps.  He is mostly limited by living in prison and the things that he is able to do.  He is occasionally feels fatigued.  He sleeps propped up on pillows due to vertigo.  No lower extremity edema, orthopnea or PND.  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"   Asthma    CAD S/P percutaneous coronary angioplasty 03/31/2018   LM PCI   Chronic lower back pain    High cholesterol    Hypertension    Mitral regurgitation    Prostate cancer ([place])    "put 3 beamers in my prostate in 11/2017 & radiation completed in 02/2018"  (03/31/2018)   Severe aortic stenosis    Type II diabetes mellitus ([place])     Past Surgical History:  Procedure Laterality Date   BACK SURGERY     BUBBLE STUDY  08/23/2021   Procedure: BUBBLE STUDY;  Surgeon: [name], MD;  Location: [facility] ENDOSCOPY;  Service: Cardiology;;   CORONARY ANGIOPLASTY WITH STENT PLACEMENT  03/31/2018   CORONARY ARTERY BYPASS GRAFT  2007   CORONARY BALLOON ANGIOPLASTY N/A 08/17/2018   Procedure: CORONARY BALLOON ANGIOPLASTY;  Surgeon: Troy Sine, MD;  Location: [place] CV LAB;  Service: Cardiovascular;  Laterality: N/A;   CORONARY STENT INTERVENTION N/A 03/31/2018   Procedure: CORONARY STENT INTERVENTION;  Surgeon: Troy Sine, MD;  Location: [place] CV LAB;  Service: Cardiovascular;  Laterality: N/A;   CORONARY STENT INTERVENTION N/A 08/17/2018   Procedure: CORONARY STENT INTERVENTION;  Surgeon: Troy Sine, MD;  Location: [place] CV LAB;  Service: Cardiovascular;  Laterality: N/A;   FOREARM FRACTURE SURGERY Right    FRACTURE SURGERY     HEMORRHOID BANDING     INTRAOPERATIVE TRANSTHORACIC ECHOCARDIOGRAM N/A 04/23/2022   Procedure: INTRAOPERATIVE TRANSTHORACIC ECHOCARDIOGRAM;  Surgeon: Sherren Mocha, MD;  Location: [place] CV LAB;  Service: Open Heart Surgery;  Laterality: N/A;   LAPAROSCOPIC CHOLECYSTECTOMY     LEFT HEART CATH AND CORONARY ANGIOGRAPHY N/A 03/31/2018   Procedure: LEFT HEART CATH AND CORONARY ANGIOGRAPHY;  Surgeon: Troy Sine, MD;  Location: [place] CV LAB;  Service: Cardiovascular;  Laterality: N/A;   LEFT HEART CATH AND CORS/GRAFTS ANGIOGRAPHY N/A 08/17/2018   Procedure: LEFT HEART CATH AND CORS/GRAFTS ANGIOGRAPHY;  Surgeon: Troy Sine, MD;  Location: [place] CV LAB;  Service: Cardiovascular;  Laterality: N/A;   ORIF SHOULDER FRACTURE Left    POSTERIOR FUSION LUMBAR SPINE     L4-5   PROSTATE BIOPSY     "put in [employer] in 11/2017"   RIGHT/LEFT HEART CATH AND CORONARY/GRAFT  ANGIOGRAPHY N/A 02/01/2022   Procedure: RIGHT/LEFT HEART CATH AND CORONARY/GRAFT ANGIOGRAPHY;  Surgeon: Sherren Mocha, MD;  Location: [place] CV LAB;  Service: Cardiovascular;  Laterality: N/A;   TEE WITHOUT CARDIOVERSION N/A 08/23/2021   Procedure: TRANSESOPHAGEAL ECHOCARDIOGRAM (TEE);  Surgeon: [name], MD;  Location: [place];  Service: Cardiology;  Laterality: N/A;   TRANSCATHETER AORTIC VALVE REPLACEMENT, TRANSFEMORAL N/A 04/23/2022   Procedure: Transcatheter Aortic Valve Replacement, Transfemoral;  Surgeon: Sherren Mocha, MD;  Location: [place] CV LAB;  Service: Open Heart Surgery;  Laterality: N/A;    Current Medications: No outpatient medications have been marked as taking for the 05/31/22 encounter (Office Visit) with [facility].     Allergies:   Lisinopril   Social History   Socioeconomic History   Marital status: Divorced    Spouse name: Not on file   Number of children: Not on file   Years of education: Not on file   Highest education level: Not on file  Occupational History   Not on file  Tobacco Use   Smoking status: Never   Smokeless tobacco: Former    Types: Nurse, children's Use: Never used  Substance and Sexual Activity   Alcohol use: Not Currently   Drug use: Not Currently   Sexual activity: Not Currently  Other Topics Concern   Not on file  Social History Narrative   Not on file   Social Determinants of Health   Financial Resource Strain: Not on file  Food Insecurity: Not on file  Transportation Needs: Not on file  Physical Activity: Not on file  Stress: Not on file  Social Connections: Not on file     Family History: The patient's family history includes Hypertension in his mother.  ROS:   Please see the history of present illness.    All other systems reviewed and are negative.  EKGs/Labs/Other Studies Reviewed:    The following studies were reviewed today:  TAVR OPERATIVE NOTE      Date of Procedure:                04/23/2022   Preoperative Diagnosis:      Severe Aortic Stenosis    Postoperative Diagnosis:    Same    Procedure:        Transcatheter Aortic Valve Replacement - Percutaneous Right Transfemoral Approach             Edwards Sapien 3 Ultra ResiliaTHV (size 26 mm, model # 9755RSL, serial # [id number])              Co-Surgeons:                        Gaye Pollack, MD and Sherren Mocha, MD       Anesthesiologist:                  Suella Broad, MD   Echocardiographer:              Jenkins Rouge, MD   Pre-operative Echo Findings: Severe aortic stenosis Normal left ventricular systolic function Severe mitral regurgitation   Post-operative Echo Findings: No paravalvular leak Normal left ventricular systolic function Unchanged severe mitral regurgitation _____________     Echo 04/24/2022 IMPRESSIONS  1. Left ventricular ejection fraction, by estimation, is 55 to 60%. The  left ventricle has normal function. The left ventricle has no regional  wall motion abnormalities. There is mild asymmetric left ventricular  hypertrophy of the basal and septal  segments. Left ventricular diastolic parameters are indeterminate.   2. Right ventricular systolic function is normal. The right ventricular  size is normal.   3. Left atrial size was moderately dilated.   4. Eccentric anteriorly directed MR with splay artifact likely posterior  leaflet prolapse . The mitral valve is abnormal. Severe mitral valve  regurgitation. No evidence of mitral stenosis.   5. Tricuspid valve regurgitation is moderate.   6. Post TAVR with 26 mm Sapien 3 valve no significant PVL mean gradient  5.5 mmHg peak 10.2 mmHg DVI 0.59. The aortic valve is normal in structure.  Aortic valve regurgitation is not  visualized. No aortic stenosis is  present. There is a 26 mm Sapien  prosthetic (TAVR) valve present in the aortic position.   7. The inferior vena cava is dilated in size with >50%  respiratory  variability, suggesting right atrial pressure of 8 mmHg.   ___________________________  Echo 05/31/22 IMPRESSIONS  1. Left ventricular ejection fraction, by estimation, is 55%. The left ventricle has low normal function. The left ventricle demonstrates regional wall motion abnormalities that are unchanged from prior exam (see scoring diagram/findings for  description). There is mild asymmetric left ventricular hypertrophy of the basal-septal segment. Left ventricular diastolic parameters are consistent with Grade II diastolic dysfunction (pseudonormalization).  2. Right ventricular systolic function is normal. The right ventricular size is normal. There is moderately elevated pulmonary artery systolic pressure. The estimated right ventricular systolic pressure is 14.7 mmHg.  3. Left atrial size was severely dilated.  4. Right atrial size was mildly dilated.  5. The mitral valve is abnormal. Severe mitral valve regurgitation, eccentric and anteriorly directed. Mechanism of MR noted on TEE 08/23/21 as flail P2 scallop of posterior mitral leaflet. No evidence of mitral stenosis. The mean mitral valve gradient is  3.0 mmHg with average heart rate of 74 bpm.  6. Tricuspid valve regurgitation is mild to moderate.  7. The aortic valve has been repaired/replaced. Aortic valve regurgitation is not visualized, no paravalvular leak. There is a 26 mm Sapien prosthetic (TAVR) valve present in the aortic position. Procedure Date: 04/23/22. Echo findings are consistent with  stable structure and function of the aortic valve prosthesis.  8. The inferior vena cava is normal in size with greater than 50% respiratory variability, suggesting right atrial pressure of 3 mmHg.  EKG:  EKG is NOT ordered today.    Recent Labs: 04/23/2022: ALT 12 04/24/2022: BUN 16; Creatinine, Ser 0.96; Hemoglobin 11.7; Magnesium 1.8; Platelets 150; Potassium 4.0; Sodium 137  Recent Lipid Panel    Component Value Date/Time    CHOL 119 08/15/2018 0632   TRIG 80 08/15/2018 0632   HDL 34 (L) 08/15/2018 0632   CHOLHDL 3.5 08/15/2018 0632   VLDL 16 08/15/2018 0632   LDLCALC 69 08/15/2018 0632     Risk Assessment/Calculations:       Physical Exam:    VS:  BP (!) 142/80   Pulse 76   Ht '5\' 8"'$  (1.727 m)   Wt 186 lb (84.4 kg)   SpO2 99%   BMI 28.28 kg/m     Wt Readings from Last 3 Encounters:  05/31/22 186 lb (84.4 kg)  05/03/22 185 lb 12.8 oz (84.3 kg)  04/24/22 183 lb 6.8 oz (83.2 kg)     GEN:  Well nourished, well developed in no acute distress HEENT: Normal NECK: No JVD LYMPHATICS: No lymphadenopathy CARDIAC: RRR, 4/6 holosystolic murmur @ apex. No rubs, gallops RESPIRATORY:  Clear to auscultation without rales, wheezing or rhonchi  ABDOMEN: Soft, non-tender, non-distended MUSCULOSKELETAL:  No edema; No deformity  SKIN: Warm and dry NEUROLOGIC:  Alert and oriented x 3 PSYCHIATRIC:  Normal affect   ASSESSMENT:    1. S/P TAVR (transcatheter aortic valve replacement)   2. LBBB (left bundle branch block)   3. Nonrheumatic mitral (valve) insufficiency   4. Hx of CABG   5. Essential hypertension     PLAN:    In order of problems listed above:  Severe AS s/p TAVR: echo today shows EF 55%, normally functioning TAVR with a mean gradient of 7.2 mm hg and no PVL as  well as severe mitral regurgitation and mild-moderate tricuspid regurgitation. He has NYHA class I symptoms. Continue on chronic Plavix therapy. SBE prophylaxis discussed; he has amoxicillin. Will see him back for 1 year echo and follow up.    New LBBB: this has been stable.    Severe MR: murmur is loud on exam. Echo today shows severe eccentric MR.  He would like to think about proceeding with MitraClip and let us know whether he decides to do it.  He is pretty stable from a symptomatic standpoint. He will get back to [place] on whether he would like to proceed with potential mTEER with MitraClip or continue clinical survellience.    CAD  s/p CABG: no chest pain. Pre TAVR cath showed multivessel coronary artery disease with continued patency of the LIMA to LAD graft, continued patency of the left main and left circumflex stents with mild to moderate in-stent restenosis, and continued patency of the RCA with mild stenosis noted. Continue medical therapy with Plavix, BB and statin.    HTN: BP borderline today. No changes made     Medication Adjustments/Labs and Tests Ordered: Current medicines are reviewed at length with the patient today.  Concerns regarding medicines are outlined above.  No orders of the defined types were placed in this encounter.  No orders of the defined types were placed in this encounter.   Patient Instructions  Medication Instructions:  Your physician recommends that you continue on your current medications as directed. Please refer to the Current Medication list given to you today.  *If you need a refill on your cardiac medications before your next appointment, please call your pharmacy*   Lab Work: none If you have labs (blood work) drawn today and your tests are completely normal, you will receive your results only by: [place] (if you have MyChart) OR A paper copy in the mail If you have any lab test that is abnormal or we need to change your treatment, we will call you to review the results.   Testing/Procedures: none   Follow-Up: At [facility], you and your health needs are our priority.  As part of our continuing mission to provide you with exceptional heart care, we have created designated Provider Care Teams.  These Care Teams include your primary Cardiologist (physician) and Advanced Practice Providers (APPs -  Physician Assistants and Nurse Practitioners) who all work together to provide you with the care you need, when you need it.  We recommend signing up for the patient portal called "MyChart".  Sign up information is provided on this After Visit Summary.  MyChart is  used to connect with patients for Virtual Visits (Telemedicine).  Patients are able to view lab/test results, encounter notes, upcoming appointments, etc.  Non-urgent messages can be sent to your provider as well.   To learn more about what you can do with MyChart, go to [email or web address].    Your next appointment:   6 month(s)  The format for your next appointment:   In Person  Provider:   Shelva Majestic, MD     Other Instructions   Important Information About Sugar         Signed, Angelena Form, PA-C  05/31/2022 3:37 PM    [place]

## 2022-06-07 NOTE — Addendum Note (Signed)
Encounter addended by: Gerarda Gunther on: 06/07/2022 4:43 PM  Actions taken: Imaging Exam ended

## 2022-09-25 ENCOUNTER — Telehealth: Payer: Self-pay | Admitting: Cardiology

## 2022-09-25 NOTE — Telephone Encounter (Signed)
Spoke to [facility] to schedule follow up with Dr. Burt Knack for the evaluation of severe mitral regurgitation. Dellia Nims is not in the office today therefore will call tomorrow to schedule appointment.   Kathyrn Drown NP-C Structural Heart Team  Pager: [phone] Phone: [phone]

## 2022-09-26 ENCOUNTER — Telehealth: Payer: Self-pay | Admitting: Cardiology

## 2022-09-26 NOTE — Telephone Encounter (Signed)
Spoke with [facility] facility for MitraClip consult with Dr. Burt Knack on 12/03/22. Authorization # [id number]   Kathyrn Drown NP-C Structural Heart Team  Pager: [phone] Phone: [phone]

## 2022-11-27 ENCOUNTER — Ambulatory Visit: Attending: Cardiovascular Disease | Admitting: Cardiovascular Disease

## 2022-11-27 DIAGNOSIS — I2510 Atherosclerotic heart disease of native coronary artery without angina pectoris: Secondary | ICD-10-CM | POA: Diagnosis not present

## 2022-11-27 DIAGNOSIS — Z951 Presence of aortocoronary bypass graft: Secondary | ICD-10-CM | POA: Diagnosis not present

## 2022-11-27 DIAGNOSIS — R0609 Other forms of dyspnea: Secondary | ICD-10-CM

## 2022-11-27 DIAGNOSIS — Z9861 Coronary angioplasty status: Secondary | ICD-10-CM

## 2022-11-27 DIAGNOSIS — I447 Left bundle-branch block, unspecified: Secondary | ICD-10-CM

## 2022-11-27 DIAGNOSIS — Z952 Presence of prosthetic heart valve: Secondary | ICD-10-CM | POA: Diagnosis not present

## 2022-11-27 DIAGNOSIS — E785 Hyperlipidemia, unspecified: Secondary | ICD-10-CM

## 2022-11-27 DIAGNOSIS — I10 Essential (primary) hypertension: Secondary | ICD-10-CM

## 2022-11-27 DIAGNOSIS — E118 Type 2 diabetes mellitus with unspecified complications: Secondary | ICD-10-CM

## 2022-11-27 DIAGNOSIS — I340 Nonrheumatic mitral (valve) insufficiency: Secondary | ICD-10-CM

## 2022-11-27 DIAGNOSIS — Z794 Long term (current) use of insulin: Secondary | ICD-10-CM

## 2022-11-27 MED ORDER — FUROSEMIDE 20 MG PO TABS: 20.0000 mg | ORAL_TABLET | ORAL | 3 refills | Status: AC

## 2022-11-27 NOTE — Progress Notes (Signed)
Cardiology Office Note    Date:  12/04/2022   ID:  Miguel Garcia, DOB Apr 28, 1946, MRN [id number]  PCP:  Patient, No Pcp Per  Cardiologist:  Shelva Majestic, MD   12 month F/U office visit   History of Present Illness:  Miguel Garcia is a 76 y.o. male who is incarcerated who I saw initially in the office on January 21, 2019 in follow-up of his previous cardiac catheterizations in April and August 2019.  I last saw him on November 05, 2021.  He presents for follow-up evaluation following TAVR.  Mr. Miguel Garcia underwent initial CABG revascularization surgery approximately 15 years ago and underwent subsequent stenting to his distal circumflex.  He has been residing at [facility].  2019 he developed symptom complex worrisome for unstable angina.  He was transferred to [facility] where I performed cardiac catheterization which revealed total LAD occlusion with a patent LIMA graft supplying the LAD territory.  He had 95% distal left main calcified stenosis with high-grade proximal stenosis and underwent successful stenting to his left main into the circumflex vessel.  A previously placed mid circumflex stent was patent.  He had done well until August 2019 Velp recurrent chest pain and was noted to have guaiac positive stools.  Repeat catheterization showed 80% in-stent restenosis in the distal left main stent with old total ostial occlusion of the LAD.  He had a patent proximal left circumflex stent with 15% intimal hyperplasia in the mid circumflex stent followed by eccentric 90% stenosis distal to the mid circumflex stent and 85% ostial stenosis in an OM1 vessel which was jailed by stent overlap of the proximal to mid previously placed circumflex stents.  His RCA was dominant with 35% smooth stenosis proximally and 30% stenosis in the midsegment.  He had a patent LIMA graft supplying the mid LAD.  He underwent successful multi lesion intervention with [place]/PTCA of his  distal left main stent with the 80% stenosis being reduced to less than 15%, successful stenting of the distal circumflex and a 2.0 x 12 mm Resolute stent was inserted postdilated to 2.25 mm with a 90% stenosis reduced to 0%, and PTCA of the ostium of the OM1 vessel with the 80% 5% stenosis being reduced to 50%.  He has been on medical therapy for his concomitant CAD.  Going to his evaluations he has been seen by [name], NP in the office.  Of note, while he was in the hospital in August an echo Doppler study showed an EF of 60 to 65% with grade 2 diastolic dysfunction.  He had at least moderate aortic stenosis with a mean gradient of 21 and peak gradient of 35.  Aortic valve area was 0.98 cm.  When he was seen by [name] had recommended a subsequent echo which apparently was done by the state and interpreted at [place] in [place] .  I reviewed this report.  Of note, there was no mention of aortic stenosis.  LV function was normal at 55% there was mild TR, moderate MR and mild aortic insufficiency.  Following his hospitalization he had undergone endoscopy and colonoscopy and his previous mild positive stools were due to her radiation and he did not have any colonic mass or polyps.  When I saw him in January 2020 he was still residing at the correction center and denied any recurrent anginal symptoms.  He presented to the office for evaluation Mr. Miguel Garcia has been residing at [facility] center.  He denied  recurrent anginal type symptoms.  During his initial office evaluation with me I reviewed his echo Doppler study and suggested he undergo a follow-up echo Doppler in at least 6 months for reevaluation of his aortic stenosis.  Since I saw him, he underwent an evaluation in September 2021 with Miguel Rives, PA-C at which time he had no anginal complaints but had experienced recent hemoptysis.  CBC was obtained at that visit for close monitoring.    He  subsequently was found to have recurrent prostate cancer on PET scanning and although he was initially recommended to start hormonal therapy, coverage was denied and he subsequently was started on Xtandi hormone therapy and leuprolide injections..  He was seen by Miguel Lofts, PA-C on March 21, 2021 with a Designer, industrial/product.  Since his prior evaluation he had noticed some increased fatigue since starting treatment for his prostate CA.  He will try to stay active and was walking 1/2-3/4 of a mile per day.  He had experienced chronic dyspnea on exertion with strenuous activity she felt was attributed to his asthma and and had not changed in recent months.  He did have occasional vertigo and denied complaints of chest pain or syncope.  He underwent a follow-up echo Doppler study on April 20, 2021 which showed an EF of 55 to 60% with grade 2 diastolic dysfunction.  Was mild left ventricular hypertrophy the basal septal segment.  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  In addition his aortic valve was severely calcified and there was evidence for mild regurgitation with moderate aortic valve stenosis.  The mean gradient was 26 mm with a peak gradient of 44.1mHg.  Aortic valve area by VTI measure was 0.92 cm.  I saw him in July 25, 2021 when he was in the office with his cDesigner, industrial/productfor further cardiologic evaluation.  [facility] is [facility] at [facility]  He is currently at [facility] [facility]  He admits that his legs get tired with walking.  He does admit to exertional shortness of breath with some leg swelling.  He denies chest tightness, presyncope or syncope.  [facility] [place] [facility] [place]  He is on carvedilol 6.25 mg twice a day in addition to isosorbide 60 mg daily and continues to be on Plavix 75 mg daily.  He is on pantoprazole for GERD.  He is  diabetic on glipizide and pioglitazone.  He is on Flomax for his urinary issues.  He continues to be on rosuvastatin 20 mg for hyperlipidemia.  During that evaluation, I reviewed his most recent echo Doppler study which now showed at least moderately severe aortic valve stenosis with a mean gradient of 26 and peak gradient of 46.6 mmHg.  However he also had progressive mitral regurgitation which was now moderately severe.  At times he admitted to leg swelling for the past 4 to 5 months and I recommended initiation of furosemide 20 mg daily.  I recommended he undergo lower extremity arterial Doppler studies as well as transesophageal echocardiographic evaluation which would need to be approved by the correctional Institute.  On August 23, 2021 he underwent his TEE by Dr. AMargaretann Loveless  This revealed normal biventricular function.  He had severe eccentric mitral valve regurgitation with a flail segment on the posterior mitral leaflet on the P2 scallop with degenerative change/calcification.  There also was a small independently mobile lesion  on the atrial surface of the mitral valve.  There was some severe and eccentric anteriorly directed jet of MR, PV blunting and systolic reversal noted.  Study also confirmed moderate to severe aortic valve stenosis with a severely calcified aortic valve, mean gradient 31, AVA by TVI at 0.7 cm and dimensionless index 0.2, SVI of 26 and mild aortic regurgitation.  I last saw him on November 05, 2021.  He was in the office with his Designer, industrial/product.  .  At times he admits to dizziness but denies chest pain.  He had shortness of breath with walking.  He typically walks 10 yards 5 times a day.  He has been on carvedilol 6.25 mg twice a day, furosemide 20 mg daily, isosorbide 60 mg, in addition to carvedilol 75 mg daily, glipizide insulin and Actos for his diabetes mellitus.  He is on rosuvastatin 20 mg for hyperlipidemia and pantoprazole for GERD.  During that evaluation his  blood pressure was low and he was mildly orthostatic.  I reduced his isosorbide from 60 down to 30 mg and reduce furosemide to 20 mg every other day.  He continued to be on carvedilol 6.25 twice a day.  I recommended support stockings with his leg edema.  I referred him to see Dr. Sherren Mocha in light of his valvular heart disease as part of the structural heart team to assess to see if his valve disease can be treated with TAVR for his aortic valve and potential mitral clip for his significant mitral regurgitation.  He was subsequently evaluated by Dr. Burt Knack and underwent cardiac catheterization on February 01, 2022 showing multivessel CAD with a patent LIMA to the LAD with continued patency of his left main, and circumflex stents with mild to moderate in-stent restenosis.  There was patency of his RCA with mild stenosis.  He was found to have severe aortic stenosis with mean gradient of 43 with peak gradient 51 and calculated valve 0.77 cm.  He had moderate pulmonary hypertension with PA pressure 59/22 with mean of 36 and large V waves of 24 consistent with severe mitral regurgitation.  He ultimately underwent successful TAVR by Dr. Sander Nephew for an [place] on Apr 23 2022.  Subsequently, he felt significantly improved and was seen on May 31, 2022 by Miguel Rota for follow-up evaluation.  He is here with a Designer, industrial/product today.  He feels significantly improved since his TAVR surgery but is still admits to shortness of breath.  He also has noticed some mild leg swelling.  He has been taking Lasix every third day.  He continues to be on carvedilol 3.25 twice a day, clopidogrel 75 mg daily, isosorbide 30 mg, and is on rosuvastatin 20 mg daily.  He is diabetic on pioglitazone and insulin.  He has neuropathy treated with gabapentin and GERD treated with pantoprazole.  He presents for evaluation.  Past Medical History:  Diagnosis Date   Anxiety    Arthritis    "hands, lower back,; L4-5;  neck; C1-2"  (03/31/2018)   Asbestosis ([place])    "of my lungs"   Asthma    CAD S/P percutaneous coronary angioplasty 03/31/2018   LM PCI   Chronic lower back pain    High cholesterol    Hypertension    Mitral regurgitation    Prostate cancer ([place])    "put 3 beamers in my prostate in 11/2017 & radiation completed in 02/2018" (03/31/2018)   Severe aortic stenosis    Type II diabetes mellitus ([place])  Past Surgical History:  Procedure Laterality Date   BACK SURGERY     BUBBLE STUDY  08/23/2021   Procedure: BUBBLE STUDY;  Surgeon: [name], MD;  Location: [facility] ENDOSCOPY;  Service: Cardiology;;   CORONARY ANGIOPLASTY WITH STENT PLACEMENT  03/31/2018   CORONARY ARTERY BYPASS GRAFT  2007   CORONARY BALLOON ANGIOPLASTY N/A 08/17/2018   Procedure: CORONARY BALLOON ANGIOPLASTY;  Surgeon: Troy Sine, MD;  Location: [place] CV LAB;  Service: Cardiovascular;  Laterality: N/A;   CORONARY STENT INTERVENTION N/A 03/31/2018   Procedure: CORONARY STENT INTERVENTION;  Surgeon: Troy Sine, MD;  Location: [place] CV LAB;  Service: Cardiovascular;  Laterality: N/A;   CORONARY STENT INTERVENTION N/A 08/17/2018   Procedure: CORONARY STENT INTERVENTION;  Surgeon: Troy Sine, MD;  Location: [place] CV LAB;  Service: Cardiovascular;  Laterality: N/A;   FOREARM FRACTURE SURGERY Right    FRACTURE SURGERY     HEMORRHOID BANDING     INTRAOPERATIVE TRANSTHORACIC ECHOCARDIOGRAM N/A 04/23/2022   Procedure: INTRAOPERATIVE TRANSTHORACIC ECHOCARDIOGRAM;  Surgeon: Sherren Mocha, MD;  Location: [place] CV LAB;  Service: Open Heart Surgery;  Laterality: N/A;   LAPAROSCOPIC CHOLECYSTECTOMY     LEFT HEART CATH AND CORONARY ANGIOGRAPHY N/A 03/31/2018   Procedure: LEFT HEART CATH AND CORONARY ANGIOGRAPHY;  Surgeon: Troy Sine, MD;  Location: [place] CV LAB;  Service: Cardiovascular;  Laterality: N/A;   LEFT HEART CATH AND CORS/GRAFTS ANGIOGRAPHY N/A 08/17/2018   Procedure: LEFT HEART CATH AND  CORS/GRAFTS ANGIOGRAPHY;  Surgeon: Troy Sine, MD;  Location: [place] CV LAB;  Service: Cardiovascular;  Laterality: N/A;   ORIF SHOULDER FRACTURE Left    POSTERIOR FUSION LUMBAR SPINE     L4-5   PROSTATE BIOPSY     "put in [employer]plants in 11/2017"   RIGHT/LEFT HEART CATH AND CORONARY/GRAFT ANGIOGRAPHY N/A 02/01/2022   Procedure: RIGHT/LEFT HEART CATH AND CORONARY/GRAFT ANGIOGRAPHY;  Surgeon: Sherren Mocha, MD;  Location: [place] [facility] LAB;  Service: Cardiovascular;  Laterality: N/A;   TEE WITHOUT CARDIOVERSION N/A 08/23/2021   Procedure: TRANSESOPHAGEAL ECHOCARDIOGRAM (TEE);  Surgeon: [name], MD;  Location: [place];  Service: Cardiology;  Laterality: N/A;   TRANSCATHETER AORTIC VALVE REPLACEMENT, TRANSFEMORAL N/A 04/23/2022   Procedure: Transcatheter Aortic Valve Replacement, Transfemoral;  Surgeon: Sherren Mocha, MD;  Location: [place] CV LAB;  Service: Open Heart Surgery;  Laterality: N/A;    Current Medications: Outpatient Medications Prior to Visit  Medication Sig Dispense Refill   acetaminophen (TYLENOL) 325 MG tablet Take 650 mg by mouth 3 (three) times daily as needed for moderate pain.     albuterol (PROVENTIL HFA;VENTOLIN HFA) 108 (90 Base) MCG/ACT inhaler Inhale 2 puffs into the lungs 4 (four) times daily as needed for wheezing or shortness of breath.     albuterol (PROVENTIL) (2.5 MG/3ML) 0.083% nebulizer solution Take 2.5 mg by nebulization 4 (four) times daily as needed for wheezing or shortness of breath.     amoxicillin (AMOXIL) 500 MG tablet Take 4 tablets (2,000 mg total) by mouth as directed. 1 hour prior to dental work including cleanings 12 tablet 12   CALCIUM CITRATE-VITAMIN D PO Take 2 tablets by mouth in the morning and at bedtime. 600 mg each     carvedilol (COREG) 3.125 MG tablet Take 3.125 mg by mouth 2 (two) times daily.     chlorpheniramine (CHLOR-TRIMETON) 4 MG tablet Take 4 mg by mouth 3 (three) times daily as  needed.  cholecalciferol (VITAMIN D) 25 MCG (1000 UNIT) tablet Take 1,000 Units by mouth daily.     clopidogrel (PLAVIX) 75 MG tablet Take 75 mg by mouth daily.     denosumab (PROLIA) 60 MG/ML SOSY injection Inject 60 mg into the skin every 6 (six) months. UR approved until 04/43/24     Dextran 70-Hypromellose (ARTIFICIAL TEARS) 0.1-0.3 % SOLN Place 1 drop into both eyes 4 (four) times daily as needed (burning).     docusate sodium (COLACE) 100 MG capsule Take 100 mg by mouth 2 (two) times daily.     DULoxetine (CYMBALTA) 20 MG capsule Take 20 mg by mouth at bedtime.     enzalutamide (XTANDI) 40 MG capsule Take 160 mg by mouth daily.     gabapentin (NEURONTIN) 300 MG capsule Take 300 mg by mouth 3 (three) times daily.     insulin glargine (LANTUS) 100 UNIT/ML injection Inject 45 Units into the skin daily.     isosorbide mononitrate (IMDUR) 30 MG 24 hr tablet Take 30 mg by mouth daily. 90 tablet 3   meclizine (ANTIVERT) 25 MG tablet Take 25 mg by mouth 3 (three) times daily as needed for dizziness.     nitroGLYCERIN (NITROSTAT) 0.4 MG SL tablet Place 1 tablet (0.4 mg total) under the tongue every 5 (five) minutes as needed for chest pain. 30 tablet 11   OVER THE COUNTER MEDICATION Take 1 capsule by mouth in the morning and at bedtime. FE/B12/C/FA/SA 150-25-20-1-50 mg     pantoprazole (PROTONIX) 20 MG tablet Take 1 tablet (20 mg total) by mouth 2 (two) times daily. 180 tablet 3   pioglitazone (ACTOS) 15 MG tablet Take 15 mg by mouth daily.     PRESCRIPTION MEDICATION Compression Hose Knee LG/LNG-XLNG OT Wear hose topically during the day as directed **Limit two (2) pair initially, then two (2) pair every six (6) Months     rosuvastatin (CRESTOR) 20 MG tablet Take 20 mg by mouth daily.     Skin Protectants, Misc. (MINERIN CREME) CREA Apply 1 application. topically 2 (two) times daily. Apply topically to the affected area a thin layer to alleviate skin changes of anti-neoplastic meds      tamsulosin (FLOMAX) 0.4 MG CAPS capsule Take 0.4 mg by mouth 2 (two) times daily.     witch hazel-glycerin (TUCKS) pad Apply 1 application. topically 2 (two) times daily as needed.     furosemide (LASIX) 20 MG tablet Take 20 mg by mouth every other day. 90 tablet 3   No facility-administered medications prior to visit.     Allergies:   Lisinopril   Social History   Socioeconomic History   Marital status: Divorced    Spouse name: Not on file   Number of children: Not on file   Years of education: Not on file   Highest education level: Not on file  Occupational History   Not on file  Tobacco Use   Smoking status: Never   Smokeless tobacco: Former    Types: Nurse, children's Use: Never used  Substance and Sexual Activity   Alcohol use: Not Currently   Drug use: Not Currently   Sexual activity: Not Currently  Other Topics Concern   Not on file  Social History Narrative   Not on file   Social Determinants of Health   Financial Resource Strain: Not on file  Food Insecurity: Not on file  Transportation Needs: Not on file  Physical Activity: Not on file  Stress: Not on file  Social Connections: Not on file   Family History:  The patient's family history includes Hypertension in his mother.   ROS General: Negative; No fevers, chills, or night sweats;  HEENT: Negative; No changes in vision or hearing, sinus congestion, difficulty swallowing Pulmonary: Negative; No cough, wheezing, shortness of breath, hemoptysis Cardiovascular: See HPI GI: Negative; No nausea, vomiting, diarrhea, or abdominal pain GU: History of prostate CA Musculoskeletal: Negative; no myalgias, joint pain, or weakness Hematologic/Oncology: Prostate CA Endocrine: Negative; no heat/cold intolerance; no diabetes Neuro: Mild dizziness Skin: Negative; No rashes or skin lesions Psychiatric: Negative; No behavioral problems, depression Sleep: Negative; No snoring, daytime sleepiness,  hypersomnolence, bruxism, restless legs, hypnogognic hallucinations, no cataplexy Other comprehensive 14 point system review is negative.   PHYSICAL EXAM:   VS:  BP 120/76   Pulse 67   Wt 199 lb (90.3 kg)   SpO2 100%   BMI 30.26 kg/m     Repeat blood pressure today by me was 112/70.  Wt Readings from Last 3 Encounters:  11/27/22 199 lb (90.3 kg)  05/31/22 186 lb (84.4 kg)  05/03/22 185 lb 12.8 oz (84.3 kg)    General: Alert, oriented, no distress.  Skin: normal turgor, no rashes, warm and dry HEENT: Normocephalic, atraumatic. Pupils equal round and reactive to light; sclera anicteric; extraocular muscles intact;  Nose without nasal septal hypertrophy Mouth/Parynx benign; Mallinpatti scale 3 Neck: No JVD, no carotid bruits; normal carotid upstroke Lungs: clear to ausculatation and percussion; no wheezing or rales Chest wall: without tenderness to palpitation Heart: PMI not displaced, RRR, s1 s2 normal, 0-2/5 systolic murmur at aortic area left sternal border.  4-2/7 apical systolic murmur consistent with mitral regurgitation, no diastolic murmur, no rubs, gallops, thrills, or heaves Abdomen: soft, nontender; no hepatosplenomehaly, BS+; abdominal aorta nontender and not dilated by palpation. Back: no CVA tenderness Pulses 2+ Musculoskeletal: full range of motion, normal strength, no joint deformities Extremities: Trace to mild lower extremity edema;no clubbing cyanosis or edema, Homan's sign negative  Neurologic: grossly nonfocal; Cranial nerves grossly wnl Psychologic: Normal mood and affect    Studies/Labs Reviewed:   November 27, 2022 ECG (independently read by me): NSR at 67, LBBB  November 04, 2021   ECG (independently read by me): NSR at 76, T wave abnormality; QTc 481 msec  July 25, 2021 ECG (independently read by me): NSR at 73, LAE, nonspecific T wave abnormality; QTc 462 msec    January 2020 ECG (independently read by me): Normal sinus rhythm at 72 bpm.  Inferior  Q waves in lead III.  T wave abnormality in leads I and aVL.  Normal intervals.  Recent Labs:    Latest Ref Rng & Units 04/24/2022    1:39 AM 04/23/2022    2:53 PM 04/23/2022    1:07 PM  BMP  Glucose 70 - 99 mg/dL 132  129  103   BUN 8 - 23 mg/dL _0 Creatinine 0.61 - 1.24 mg/dL 0.96  0.60  0.70   Sodium 135 - 145 mmol/L 137  140  139   Potassium 3.5 - 5.1 mmol/L 4.0  3.9  4.0   Chloride 98 - 111 mmol/L 109  106  107   CO2 22 - 32 mmol/L 20     Calcium 8.9 - 10.3 mg/dL 7.8           Latest Ref Rng & Units 04/23/2022   10:38 AM 08/19/2018    2:15  AM 04/01/2018    3:16 AM  Hepatic Function  Total Protein 6.5 - 8.1 g/dL 7.1  6.3  5.6   Albumin 3.5 - 5.0 g/dL 3.7  3.5  3.0   AST 15 - 41 U/L _0 ALT 0 - 44 U/L _1 Alk Phosphatase 38 - 126 U/L 107  66  62   Total Bilirubin 0.3 - 1.2 mg/dL 0.7  0.8  0.5   Bilirubin, Direct 0.1 - 0.5 mg/dL   0.1        Latest Ref Rng & Units 04/24/2022    1:39 AM 04/23/2022    2:53 PM 04/23/2022    1:07 PM  CBC  WBC 4.0 - 10.5 K/uL 5.9     Hemoglobin 13.0 - 17.0 g/dL 11.7  11.6  12.9   Hematocrit 39.0 - 52.0 % 34.4  34.0  38.0   Platelets 150 - 400 K/uL 150      Lab Results  Component Value Date   MCV 92.7 04/24/2022   MCV 92.0 04/23/2022   MCV 90.1 02/01/2022   No results found for: "TSH" Lab Results  Component Value Date   HGBA1C 6.1 (H) 08/15/2018     BNP No results found for: "BNP"  ProBNP No results found for: "PROBNP"   Lipid Panel     Component Value Date/Time   CHOL 119 08/15/2018 0632   TRIG 80 08/15/2018 0632   HDL 34 (L) 08/15/2018 0632   CHOLHDL 3.5 08/15/2018 0632   VLDL 16 08/15/2018 0632   LDLCALC 69 08/15/2018 8366     RADIOLOGY: No results found.   Additional studies/ records that were reviewed today include:  Reviewed his prior cardiac catheterization reports and interventions, echo Doppler study from [facility] as well as from the state and subsequent office visit with [name] and Francia Greaves, DO.  Office visits from [place], PA-C and Miguel Lofts, PA-C were reviewed.  ECHO: 04/20/2021 IMPRESSIONS   1. Left ventricular ejection fraction, by estimation, is 55 to 60%. The  left ventricle has normal function. The left ventricle demonstrates  regional wall motion abnormalities (see scoring diagram/findings for  description). There is mild left ventricular   hypertrophy of the basal-septal segment. Left ventricular diastolic  parameters are consistent with Grade II diastolic dysfunction  (pseudonormalization). Elevated left atrial pressure. There is moderate  hypokinesis of the left ventricular, basal-mid  inferolateral wall.   2. Right ventricular systolic function is normal. The right ventricular  size is normal. There is normal pulmonary artery systolic pressure.   3. Left atrial size was severely dilated.   4. The mitral valve is myxomatous. Moderate to severe mitral valve  regurgitation. No evidence of mitral stenosis. There is mild holosystolic  prolapse of the middle scallop of the posterior leaflet of the mitral  valve.   5. The aortic valve is tricuspid. There is severe calcification of the  aortic valve. There is severe thickening of the aortic valve. Aortic valve  regurgitation is mild. Moderate aortic valve stenosis. The aortic valve  area and the dimensionless index  overestimate the severity of aortic stenosis due to erroneous placement of  the LVOT pulsed Doppler sample volume.   6. The inferior vena cava is normal in size with greater than 50%  respiratory variability, suggesting right atrial pressure of 3 mmHg.   Comparison(s): Prior images reviewed side by side. The left ventricular  function is unchanged. The  left ventricular wall motion abnormality is  unchanged. Aortic stenosis has worsened. Mitral insufficiency was probably  underestimated on the previous echo,   but has also probably worsened.    TEE:  08/23/2021 IMPRESSIONS   1. Left ventricular ejection fraction, by estimation, is 55 to 60%. The  left ventricle has normal function. The left ventricle demonstrates  regional wall motion abnormalities (see scoring diagram/findings for  description).   2. Right ventricular systolic function is normal. The right ventricular  size is normal.   3. Left atrial size was severely dilated. No left atrial/left atrial  appendage thrombus was detected. The LAA emptying velocity was 61 cm/s.   4. Right atrial size was mildly dilated.   5. Severe, eccentric mitral valve regurgitation. There is a flail segment  on the posterior mitral leaflet on the P2 scallop with degenerative  change/calcification. There is also a small independently mobile lesion on  the atrial surface of the mitral  valve. Cannot exclude degenerative change from prior endocarditis (best  seen in clip 68). Severe and eccentric anteriorly direct jet of mitral  valve regurgitation. PV blunting and systolic reversal noted. The mitral  valve is abnormal. Severe mitral valve   regurgitation. No evidence of mitral stenosis.   6. The aortic valve is abnormal. There is severe calcifcation of the  aortic valve. Aortic valve regurgitation is mild. Moderate to severe  aortic valve stenosis. Aortic valve area, by VTI measures 0.70 cm. Aortic  valve mean gradient measures 30.5 mmHg.  Aortic valve Vmax measures 3.55 m/s.   7. Cannot exclude small PFO with left to right shunt by color flow..  Agitated saline contrast bubble study was negative, with no evidence of  right to left shunt.   8. There is Moderate (Grade III) atheroma plaque involving the transverse  and descending aorta.   Conclusion(s)/Recommendation(s): Consider inflammatory markers and blood  cultures to exclude endocarditis.   FINDINGS   Left Ventricle: Left ventricular ejection fraction, by estimation, is 55  to 60%. The left ventricle has normal function. The left  ventricle  demonstrates regional wall motion abnormalities. The left ventricular  internal cavity size was normal in size.      LV Wall Scoring:  The antero-lateral wall is hypokinetic.   Right Ventricle: The right ventricular size is normal. No increase in  right ventricular wall thickness. Right ventricular systolic function is  normal.   Left Atrium: Left atrial size was severely dilated. No left atrial/left  atrial appendage thrombus was detected. The LAA emptying velocity was 61  cm/s.   Right Atrium: Right atrial size was mildly dilated.   Pericardium: There is no evidence of pericardial effusion.   Mitral Valve: Severe, eccentric mitral valve regurgitation. There is a  flail segment on the posterior mitral leaflet on the P2 scallop with  degenerative change/calcification. There is also a small independently  mobile lesion on the atrial surface of the  mitral valve. Cannot exclude degenerative change from prior endocarditis  (best seen in clip 68). Severe and eccentric anteriorly direct jet of  mitral valve regurgitation. PV blunting and systolic reversal noted. The  mitral valve is abnormal. Severe  mitral valve regurgitation. No evidence of mitral valve stenosis.   Tricuspid Valve: The tricuspid valve is grossly normal. Tricuspid valve  regurgitation is mild.   Aortic Valve: The aortic valve is abnormal. There is severe calcifcation  of the aortic valve. Aortic valve regurgitation is mild. Moderate to  severe aortic stenosis is present. Aortic valve  mean gradient measures  30.5 mmHg. Aortic valve peak gradient  measures 50.4 mmHg. Aortic valve area, by VTI measures 0.70 cm.   Pulmonic Valve: The pulmonic valve was normal in structure. Pulmonic valve  regurgitation is trivial.   Aorta: The aortic root and ascending aorta are structurally normal, with  no evidence of dilitation. There is moderate (Grade III) atheroma plaque  involving the transverse and descending  aorta.   IAS/Shunts: Cannot exclude small PFO with left to right shunt by color  flow. Agitated saline contrast was given intravenously to evaluate for  intracardiac shunting. Agitated saline contrast bubble study was negative,  with no evidence of any interatrial  shunt.      LEFT VENTRICLE  PLAX 2D  LVOT diam:     2.10 cm  LV SV:         57  LV SV Index:   28  LVOT Area:     3.46 cm      AORTIC VALVE  AV Area (Vmax):    0.73 cm  AV Area (Vmean):   0.69 cm  AV Area (VTI):     0.70 cm  AV Vmax:           355.00 cm/s  AV Vmean:          262.000 cm/s  AV VTI:            0.812 m  AV Peak Grad:      50.4 mmHg  AV Mean Grad:      30.5 mmHg  LVOT Vmax:         74.86 cm/s  LVOT Vmean:        52.164 cm/s  LVOT VTI:          0.164 m  LVOT/AV VTI ratio: 0.20     AORTA  Ao Root diam: 3.30 cm  Ao Asc diam:  3.50 cm   MR Peak grad:    154.4 mmHg  MR Mean grad:    81.0 mmHg   SHUNTS  MR Vmax:         621.26 cm/s Systemic VTI:  0.16 m  MR Vmean:        442.9 cm/s  Systemic Diam: 2.10 cm  MR PISA Nyquist: 3.9 m/s  MR PISA:         9.05 cm  MR PISA Eff ROA: 56 mm  MR PISA Radius:  1.20 cm   Cherlynn Kaiser MD  Electronically signed by Cherlynn Kaiser MD  Signature Date/Time: 08/23/2021/11:59:38 AM   ASSESSMENT:    1. CAD with Hx of CABG   2. CAD S/P percutaneous coronary angioplasty   3. 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   4. Essential hypertension   5. Severe nonrheumatic mitral valve regurgitation   6. Type 2 diabetes mellitus with complication, with long-term current use of insulin ([place])   7. Coronary artery disease involving native coronary artery of native heart without angina pectoris   8. Dyslipidemia, goal LDL below 70   9. LBBB (left bundle branch block)   10. Exertional dyspnea     PLAN:  Mr. Jeffey Janssen is a 72 -year-old gentleman who has been incarcerated for over 15 years.  He underwent initial  CABG revascularization surgery in 2007 at [facility] and subsequently required percutaneous coronary interventions.  In 2019, I performed cardiac catheterization which revealed total LAD occlusion with patent LIMA graft supplying the LAD territory.  He had 95%  distal left main calcified stenosis with high-grade proximal stenosis and underwent successful stenting of his left main into the circumflex vessel.  Subsequently, due to restenosis he underwent successful multi lesion intervention with [place]/PTCA of his distal left main stent and successful stenting of his distal circumflex with insertion of a new Resolute stent and PTCA of the ostium of his OM1 vessel.  Remotely, he had episodes of guaiac positive stools and ultimately Brilinta was changed to clopidogrel.  Over the years he has developed progressive aortic stenosis my last visit from November 2022 I referred him to Dr. Burt Knack and the structural heart team for consideration of TAVR.  Of note he also has been found to have progressive moderately severe mitral regurgitation which has progressed to severe..  Successful TAVR surgery on Apr 23, 2022 for his severe aortic stenosis and received an Edwards SAPIEN 3 ultra Resilia THV (size 26 mm valve).  Subsequently, he has noticed significant improvement in his prior symptomatology but he continues to experience some shortness of breath.  He also has noticed some leg swelling.  Records from his correctional officer indicates that he has been taking Lasix every third day 20 mg.  I have suggested he increase this to 20 mg every other day.  Repeat blood pressure by me was stable at 112/70.  He continues to be on carvedilol 3.125 mg twice a day, isosorbide 30 mg daily and denies anginal symptoms.  He is on rosuvastatin 20 mg for hyperlipidemia with target LDL less than 55.  He has an appointment to see Dr. Burt Knack on December 03, 2022 and is scheduled for follow-up echocardiographic evaluation in the  spring 2024.  He continues to have significant mitral regurgitation.  Will follow-up with the structural heart team.  I will see him in a year for follow-up evaluation or sooner as needed.   Medication Adjustments/Labs and Tests Ordered: Current medicines are reviewed at length with the patient today.  Concerns regarding medicines are outlined above.  Medication changes, Labs and Tests ordered today are listed in the Patient Instructions below. Patient Instructions  Medication Instructions:  Your physician has recommended you make the following change in your medication:  Lasix: 80m Every other day  *If you need a refill on your cardiac medications before your next appointment, please call your pharmacy*   Lab Work: NONE If you have labs (blood work) drawn today and your tests are completely normal, you will receive your results only by: [place](if you have MyChart) OR A paper copy in the mail If you have any lab test that is abnormal or we need to change your treatment, we will call you to review the results.   Testing/Procedures: NONE   Follow-Up: [facility] you and your health needs are our priority.  As part of our continuing mission to provide you with exceptional heart care, we have created designated Provider Care Teams.  These Care Teams include your primary Cardiologist (physician) and Advanced Practice Providers (APPs -  Physician Assistants and Nurse Practitioners) who all work together to provide you with the care you need, when you need it.  We recommend signing up for the patient portal called "MyChart".  Sign up information is provided on this After Visit Summary.  MyChart is used to connect with patients for Virtual Visits (Telemedicine).  Patients are able to view lab/test results, encounter notes, upcoming appointments, etc.  Non-urgent messages can be sent to your provider as well.   To learn  more about what you can do with MyChart, go to  [email or web address].    Your next appointment:   1 year(s)  The format for your next appointment:   In Person  Provider:   Shelva Majestic, MD     Signed, Shelva Majestic, MD  12/04/2022 5:47 PM    [facility] [address] [place], [place], [place]  [address] Phone: [phone]

## 2022-11-27 NOTE — Patient Instructions (Addendum)
Medication Instructions:  Your physician has recommended you make the following change in your medication:  Lasix: '20mg'$  Every other day  *If you need a refill on your cardiac medications before your next appointment, please call your pharmacy*   Lab Work: NONE If you have labs (blood work) drawn today and your tests are completely normal, you will receive your results only by: [place] (if you have MyChart) OR A paper copy in the mail If you have any lab test that is abnormal or we need to change your treatment, we will call you to review the results.   Testing/Procedures: NONE   Follow-Up: At [facility], you and your health needs are our priority.  As part of our continuing mission to provide you with exceptional heart care, we have created designated Provider Care Teams.  These Care Teams include your primary Cardiologist (physician) and Advanced Practice Providers (APPs -  Physician Assistants and Nurse Practitioners) who all work together to provide you with the care you need, when you need it.  We recommend signing up for the patient portal called "MyChart".  Sign up information is provided on this After Visit Summary.  MyChart is used to connect with patients for Virtual Visits (Telemedicine).  Patients are able to view lab/test results, encounter notes, upcoming appointments, etc.  Non-urgent messages can be sent to your provider as well.   To learn more about what you can do with MyChart, go to [email or web address].    Your next appointment:   1 year(s)  The format for your next appointment:   In Person  Provider:   Shelva Majestic, MD

## 2022-12-03 ENCOUNTER — Institutional Professional Consult (permissible substitution): Admitting: Cardiovascular Disease

## 2022-12-04 ENCOUNTER — Encounter: Payer: Self-pay | Admitting: Cardiovascular Disease

## 2022-12-26 ENCOUNTER — Telehealth: Payer: Self-pay

## 2022-12-26 NOTE — Telephone Encounter (Signed)
Received fax from [facility] to schedule appointment with Dr. Burt Knack for the patient. Called and confirmed echo/office visit with Dr. Burt Knack scheduled 04/23/2023.  Lambert Mody was grateful for assistance.   [place] Phone: [phone] ext: [phone]

## 2023-01-17 ENCOUNTER — Telehealth: Payer: Self-pay | Admitting: Cardiovascular Disease

## 2023-01-17 NOTE — Telephone Encounter (Signed)
Calling with authorization number for echo #[id number]

## 2023-01-17 NOTE — Telephone Encounter (Signed)
Spoke with Miguel Garcia at the medical office at [facility] to verify authorization coed for echo ([id number]).

## 2023-02-06 IMAGING — CT CT CTA ABD/PEL W/CM AND/OR W/O CM
2 of 7 series · 15 of 36 positions shown · IV contrast (APPLIED)
Comparison: None.

CLINICAL DATA: 75-year-old male with history of severe aortic
stenosis under preoperative evaluation prior to potential
transcatheter aortic valve replacement (TAVR) procedure.

EXAM:
CT ANGIOGRAPHY CHEST, ABDOMEN AND PELVIS
TECHNIQUE: Multidetector CT imaging through the chest, abdomen and pelvis was
performed using the standard protocol during bolus administration of
intravenous contrast. Multiplanar reconstructed images and MIPs were
obtained and reviewed to evaluate the vascular anatomy.

[Series 3: ax thins · axial · 0.59mm/px · z∈[+680,+1310]mm · 14 of 710 slices shown]
[im 40/710  lung]
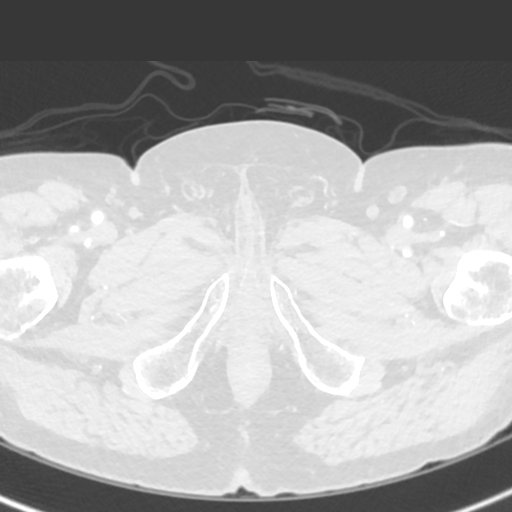
[im 79/710  mediastinal]
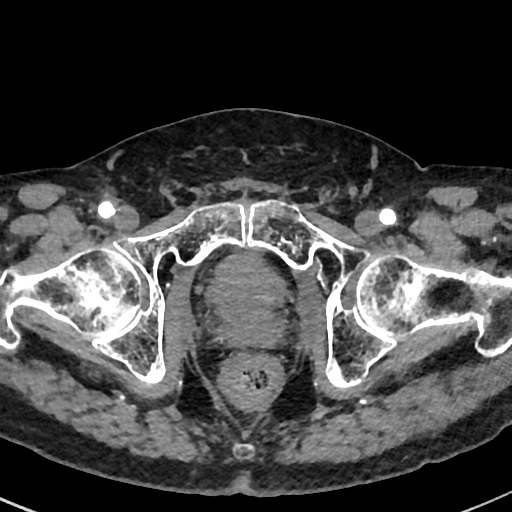
[im 158/710  lung]
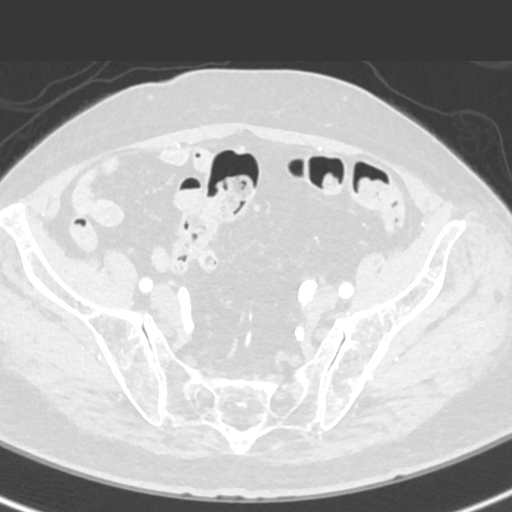
[im 197/710  mediastinal]
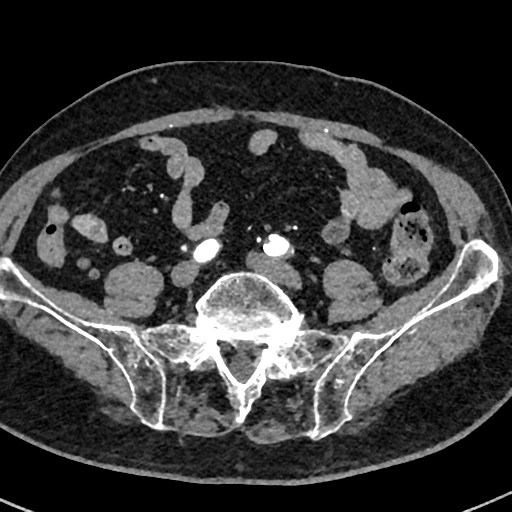
[im 237/710  lung]
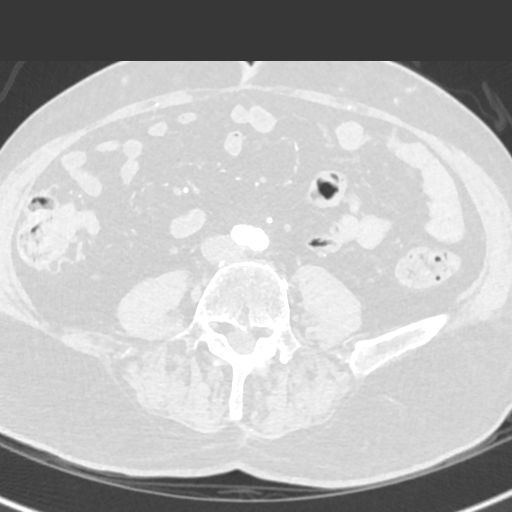
[im 276/710  mediastinal]
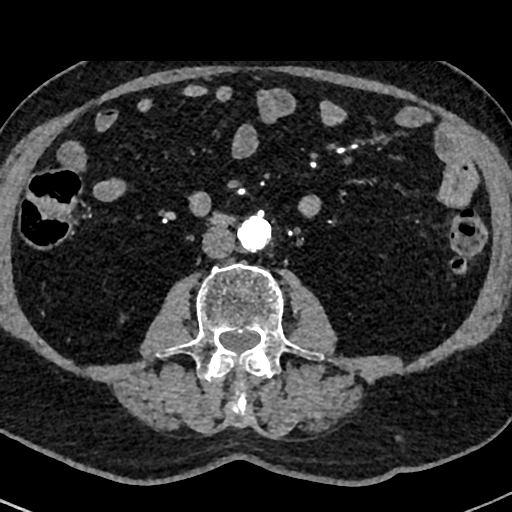
[im 316/710  lung]
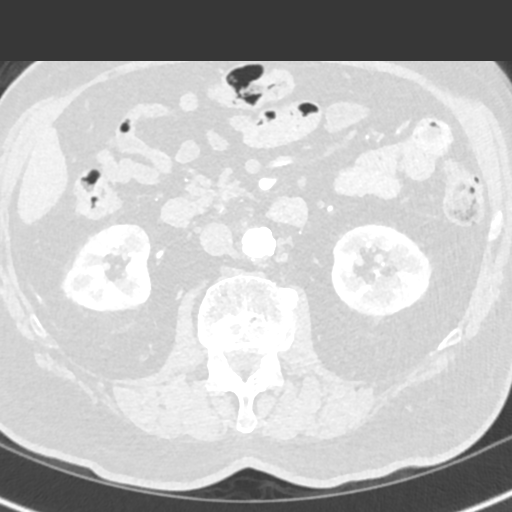
[im 394/710  mediastinal]
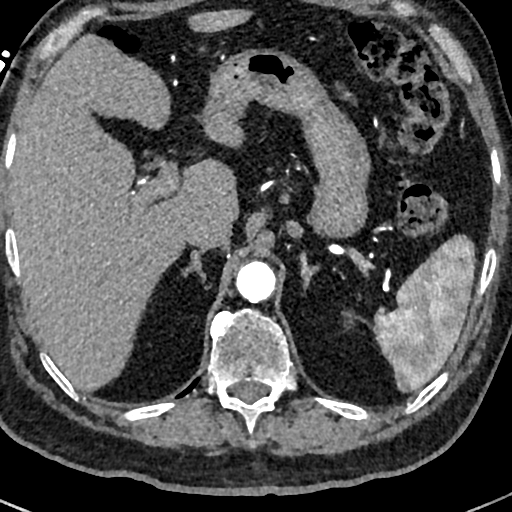
[im 434/710  lung]
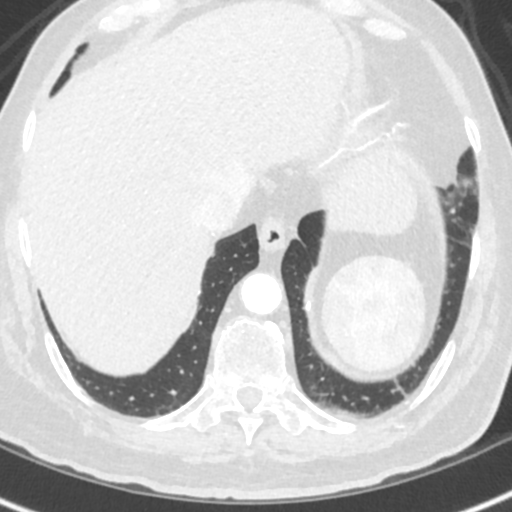
[im 473/710  mediastinal]
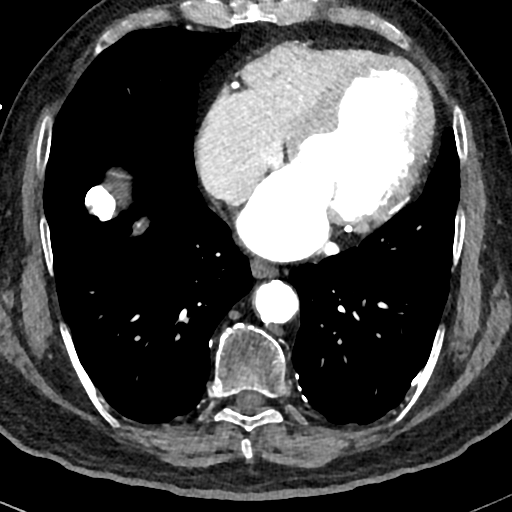
[im 513/710  lung]
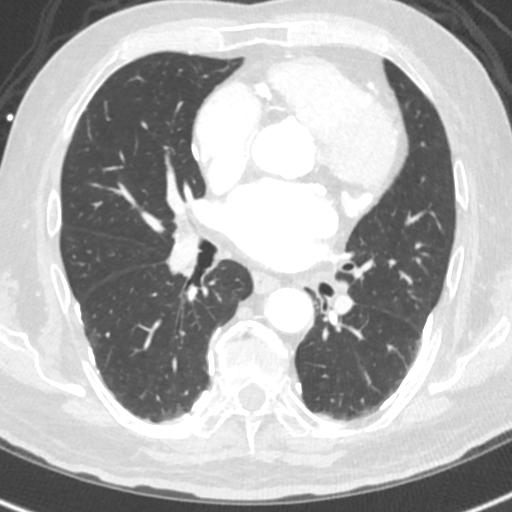
[im 552/710  mediastinal]
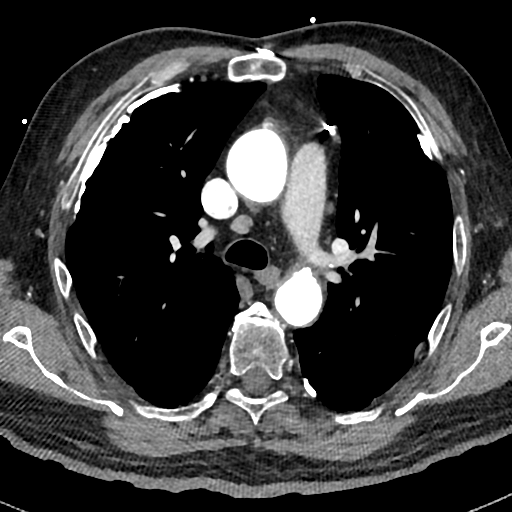
[im 631/710  lung]
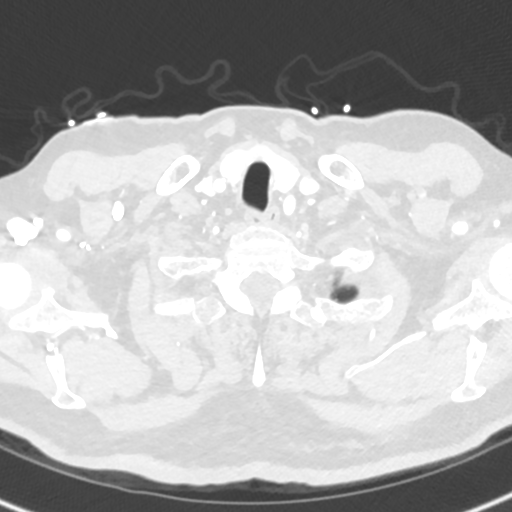
[im 670/710  mediastinal]
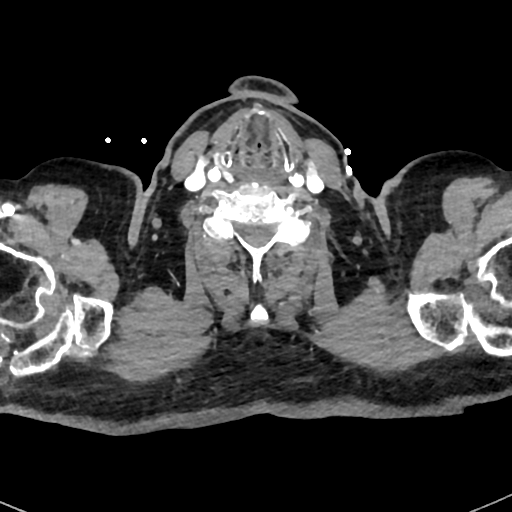

[Series 6: cor · coronal · 0.82mm/px · 1 of 151 slices shown]
[im 76/151  mediastinal]
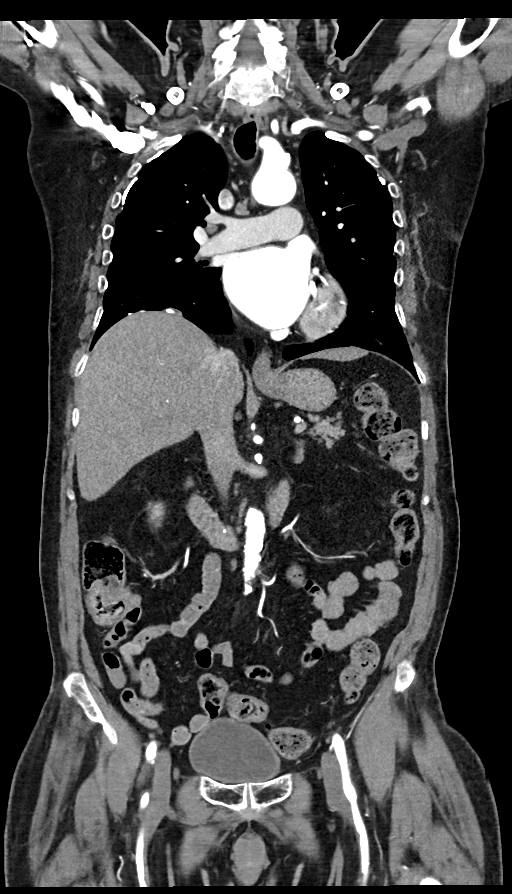

[15 of 36 positions shown; findings below may reference images not displayed]

RADIATION DOSE REDUCTION: This exam was performed according to the
departmental dose-optimization program which includes automated
exposure control, adjustment of the mA and/or kV according to
patient size and/or use of iterative reconstruction technique.

CONTRAST:  100mL OMNIPAQUE IOHEXOL 350 MG/ML SOLN
FINDINGS: CTA CHEST FINDINGS

Cardiovascular: Heart size is mildly enlarged with left atrial
dilatation. There is no significant pericardial fluid, thickening or
pericardial calcification. There is aortic atherosclerosis, as well
as atherosclerosis of the great vessels of the mediastinum and the
coronary arteries, including calcified atherosclerotic plaque in the
left main, left anterior descending, left circumflex and right
coronary arteries. Status post median sternotomy for CABG including
[REDACTED] to the LAD. Severe thickening and calcification of the aortic
valve.

Mediastinum/Lymph Nodes: No pathologically enlarged mediastinal or
hilar lymph nodes. Esophagus is unremarkable in appearance. No
axillary lymphadenopathy.

Lungs/Pleura: Numerous calcified pleural plaques are noted
throughout the thorax bilaterally, indicative of asbestos related
pleural disease. No acute consolidative airspace disease. No pleural
effusions. No suspicious appearing pulmonary nodules or masses are
noted.

Musculoskeletal/Soft Tissues: There are no aggressive appearing
lytic or blastic lesions noted in the visualized portions of the
skeleton. Median sternotomy wires.

CTA ABDOMEN AND PELVIS FINDINGS

Hepatobiliary: No suspicious cystic or solid hepatic lesions. No
intra or extrahepatic biliary ductal dilatation. Status post
cholecystectomy.

Pancreas: No pancreatic mass. No pancreatic ductal dilatation. No
pancreatic or peripancreatic fluid collections or inflammatory
changes.

Spleen: Unremarkable.

Adrenals/Urinary Tract: Bilateral kidneys and bilateral adrenal
glands are normal in appearance. No hydroureteronephrosis. Urinary
bladder is normal in appearance.

Stomach/Bowel: The appearance of the stomach is normal. There is no
pathologic dilatation of small bowel or colon. Several colonic
diverticulae are noted, particular in the in the descending colon
and sigmoid colon, without surrounding inflammatory changes to
indicate an associated acute diverticulitis at this time. Normal
appendix.

Vascular/Lymphatic: Aortic atherosclerosis, with vascular findings
and measurements pertinent to potential TAVR procedure, as detailed
below. No aneurysm or dissection noted in the abdominal or pelvic
vasculature. No lymphadenopathy noted in the abdomen or pelvis.

Reproductive: Fiducial markers in the prostate gland. Prostate gland
and seminal vesicles are otherwise unremarkable in appearance.

Other: No significant volume of ascites.  No pneumoperitoneum.

Musculoskeletal: There are no aggressive appearing lytic or blastic
lesions noted in the visualized portions of the skeleton.

VASCULAR MEASUREMENTS PERTINENT TO TAVR:

AORTA:

Minimal Aortic Fiameter-18 x 12 mm

Severity of Aortic Calcification-severe

RIGHT PELVIS:

Right Common Iliac Artery -

Minimal Yiameter-X.Q x 7.2 mm

Tortuosity-mild

Calcification-moderate to severe

Right External Iliac Artery -

Minimal Liameter-S.Q x 6.2 mm

Tortuosity-moderate

Calcification-none

Right Common Femoral Artery -

Minimal 5iameter-W.D x 6.7 mm

Tortuosity-mild

Calcification-mild

LEFT PELVIS:

Left Common Iliac Artery -

Minimal Riameter-C.N x 7.0 mm

Tortuosity-mild

Calcification-moderate to severe

Left External Iliac Artery -

Minimal Oiameter-X.9 x 6.8 mm

Tortuosity-moderate

Calcification-none

Left Common Femoral Artery -

Minimal 8iameter-S.W x 7.4 mm

Tortuosity-mild

Calcification-none

Review of the MIP images confirms the above findings.
IMPRESSION: 1. Vascular findings and measurements pertinent to potential TAVR
procedure, as detailed above.
2. Severe thickening and calcification of the aortic valve,
compatible with reported clinical history of severe aortic stenosis.
3. Aortic atherosclerosis, in addition to left main and three-vessel
coronary artery disease. Status post median sternotomy for CABG
including [REDACTED] to the LAD.
4. Asbestos related pleural disease.
5. Additional incidental findings, as above.

## 2023-04-22 NOTE — Progress Notes (Signed)
[facility]   MULTIDISCIPLINARY HEART VALVE CLINIC                                     Cardiology Office Note:    Date:  04/24/2023   ID:  Miguel Garcia, DOB 1946/01/17, MRN [id number]  PCP:  Patient, No Pcp Per  [facility] Cardiologist:  Nicki Guadalajara, MD / Dr. Excell Seltzer & Dr. Laneta Simmers (TAVR)  [facility] Electrophysiologist:  None   Referring MD: Janetta Hora, PA*   Chief Complaint  Patient presents with   Follow-up    1 year s/p TAVR   History of Present Illness:    Miguel Garcia is a 77 y.o. 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    An echo in 03/2021 showed a trileaflet aortic valve with severe thickening and calcification.  The mean gradient was measured at 26 mmHg with a peak gradient of 45 mmHg.  Valve area was 0.92 cm. Dimensionless index was 0.24 with a stroke-volume index of 31.  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  A TEE was done on 08/23/2021 which showed severe calcification of the aortic valve with a mean gradient of 30 mmHg and a valve area of 0.7 cm by VTI.  There was severe eccentric mitral valve regurgitation with a flail P2 segment of the posterior leaflet.  There was a small mobile lesion on the atrial surface of the mitral valve that was suspicious for possible old endocarditis. There was pulmonary vein blunting and systolic reversal noted consistent with severe regurgitation.  Left ventricular ejection fraction was 55 to 60%.  He subsequent underwent cardiac catheterization on 02/01/2022 showing multivessel coronary disease with a patent LIMA to the LAD and continued patency of  the left main and left circumflex stents with mild to moderate in-stent restenosis.  There was continued patency of the RCA with mild stenosis. There was severe aortic stenosis with a mean gradient across the valve of 43 mmHg and a peak to peak gradient of 51 mmHg.  Calculated valve area was 0.77 cm. There was moderate pulmonary hypertension with a PA pressure of 59/22 with a mean of 36 and a large V wave of 24 mmHg consistent with severe mitral regurgitation.   He was evaluated by the multidisciplinary valve team and underwent successful TAVR with a 26 mm Edwards Sapien 3 Ultra Resilia THV via the TF approach on 04/23/22. Post operative echo showed EF 55%, normally functioning TAVR with a mean gradient of 5.5 mmHg and no PVL as well as severe MR- eccentric anteriorly directed MR with splay artifact likely posterior leaflet prolapse. He was discharged on his chronic plavix. Given new LBBB he was discharged with a Zio AT but this never functioned correctly and was sent back to the company.   In last follow up he was doing well with no complaints. One month TAVR echo showed severe eccentric MR and TEER was briefly discussed although he was felt symptomatically stable at that time.   Today he is here for follow up. He reports that over the  last several months he has been experiencing more DOE. He denies recent hospitalizations and has no reports of worsening LE edema. He does prop up when he sleeps. He was scheduled to see Dr. Excell Seltzer for TEER consultation in 11/2022 however there were transportation issues and this was canceled. He denies chest pain, palpitations, dizziness, bleeding or syncope.   Past Medical History:  Diagnosis Date   Anxiety    Arthritis    "hands, lower back,; L4-5;  neck; C1-2" (03/31/2018)   Asbestosis (HCC)    "of my lungs"   Asthma    CAD S/P percutaneous coronary angioplasty 03/31/2018   LM PCI   Chronic lower back pain    High cholesterol    Hypertension    Mitral  regurgitation    Prostate cancer (HCC)    "put 3 beamers in my prostate in 11/2017 & radiation completed in 02/2018" (03/31/2018)   Severe aortic stenosis    Type II diabetes mellitus (HCC)     Past Surgical History:  Procedure Laterality Date   BACK SURGERY     BUBBLE STUDY  08/23/2021   Procedure: BUBBLE STUDY;  Surgeon: Parke Poisson, MD;  Location: [facility] ENDOSCOPY;  Service: Cardiology;;   CORONARY ANGIOPLASTY WITH STENT PLACEMENT  03/31/2018   CORONARY ARTERY BYPASS GRAFT  2007   CORONARY BALLOON ANGIOPLASTY N/A 08/17/2018   Procedure: CORONARY BALLOON ANGIOPLASTY;  Surgeon: Lennette Bihari, MD;  Location: [facility] INVASIVE CV LAB;  Service: Cardiovascular;  Laterality: N/A;   CORONARY STENT INTERVENTION N/A 03/31/2018   Procedure: CORONARY STENT INTERVENTION;  Surgeon: Lennette Bihari, MD;  Location: [facility] INVASIVE CV LAB;  Service: Cardiovascular;  Laterality: N/A;   CORONARY STENT INTERVENTION N/A 08/17/2018   Procedure: CORONARY STENT INTERVENTION;  Surgeon: Lennette Bihari, MD;  Location: [facility] INVASIVE CV LAB;  Service: Cardiovascular;  Laterality: N/A;   FOREARM FRACTURE SURGERY Right    FRACTURE SURGERY     HEMORRHOID BANDING     INTRAOPERATIVE TRANSTHORACIC ECHOCARDIOGRAM N/A 04/23/2022   Procedure: INTRAOPERATIVE TRANSTHORACIC ECHOCARDIOGRAM;  Surgeon: Tonny Bollman, MD;  Location: [facility] INVASIVE CV LAB;  Service: Open Heart Surgery;  Laterality: N/A;   LAPAROSCOPIC CHOLECYSTECTOMY     LEFT HEART CATH AND CORONARY ANGIOGRAPHY N/A 03/31/2018   Procedure: LEFT HEART CATH AND CORONARY ANGIOGRAPHY;  Surgeon: Lennette Bihari, MD;  Location: [facility] INVASIVE CV LAB;  Service: Cardiovascular;  Laterality: N/A;   LEFT HEART CATH AND CORS/GRAFTS ANGIOGRAPHY N/A 08/17/2018   Procedure: LEFT HEART CATH AND CORS/GRAFTS ANGIOGRAPHY;  Surgeon: Lennette Bihari, MD;  Location: [facility] INVASIVE CV LAB;  Service: Cardiovascular;  Laterality: N/A;   ORIF SHOULDER FRACTURE Left    POSTERIOR FUSION LUMBAR SPINE     L4-5    PROSTATE BIOPSY     "put in 3 [employer] Transponder Implants in 11/2017"   RIGHT/LEFT HEART CATH AND CORONARY/GRAFT ANGIOGRAPHY N/A 02/01/2022   Procedure: RIGHT/LEFT HEART CATH AND CORONARY/GRAFT ANGIOGRAPHY;  Surgeon: Tonny Bollman, MD;  Location: [facility] INVASIVE CV LAB;  Service: Cardiovascular;  Laterality: N/A;   TEE WITHOUT CARDIOVERSION N/A 08/23/2021   Procedure: TRANSESOPHAGEAL ECHOCARDIOGRAM (TEE);  Surgeon: Parke Poisson, MD;  Location: [facility] ENDOSCOPY;  Service: Cardiology;  Laterality: N/A;   TRANSCATHETER AORTIC VALVE REPLACEMENT, TRANSFEMORAL N/A 04/23/2022   Procedure: Transcatheter Aortic Valve Replacement, Transfemoral;  Surgeon: Tonny Bollman, MD;  Location: [facility] INVASIVE CV LAB;  Service: Open Heart Surgery;  Laterality: N/A;    Current Medications: Current Meds  Medication Sig   acetaminophen (TYLENOL) 325  MG tablet Take 650 mg by mouth 3 (three) times daily as needed for moderate pain.   albuterol (PROVENTIL HFA;VENTOLIN HFA) 108 (90 Base) MCG/ACT inhaler Inhale 2 puffs into the lungs 4 (four) times daily as needed for wheezing or shortness of breath.   albuterol (PROVENTIL) (2.5 MG/3ML) 0.083% nebulizer solution Take 2.5 mg by nebulization 4 (four) times daily as needed for wheezing or shortness of breath.   amoxicillin (AMOXIL) 500 MG tablet Take 4 tablets (2,000 mg total) by mouth as directed. 1 hour prior to dental work including cleanings   CALCIUM CITRATE-VITAMIN D PO Take 2 tablets by mouth in the morning and at bedtime. 600 mg each   carvedilol (COREG) 3.125 MG tablet Take 3.125 mg by mouth 2 (two) times daily.   chlorpheniramine (CHLOR-TRIMETON) 4 MG tablet Take 4 mg by mouth 3 (three) times daily as needed.   cholecalciferol (VITAMIN D) 25 MCG (1000 UNIT) tablet Take 1,000 Units by mouth daily.   clopidogrel (PLAVIX) 75 MG tablet Take 75 mg by mouth daily.   denosumab (PROLIA) 60 MG/ML SOSY injection Inject 60 mg into the skin every 6 (six) months. UR approved until  04/43/24   Dextran 70-Hypromellose (ARTIFICIAL TEARS) 0.1-0.3 % SOLN Place 1 drop into both eyes 4 (four) times daily as needed (burning).   docusate sodium (COLACE) 100 MG capsule Take 100 mg by mouth 2 (two) times daily.   DULoxetine (CYMBALTA) 20 MG capsule Take 20 mg by mouth at bedtime.   enzalutamide (XTANDI) 40 MG capsule Take 160 mg by mouth daily.   furosemide (LASIX) 20 MG tablet Take 1 tablet (20 mg total) by mouth every other day.   gabapentin (NEURONTIN) 300 MG capsule Take 300 mg by mouth 3 (three) times daily.   insulin glargine (LANTUS) 100 UNIT/ML injection Inject 45 Units into the skin daily.   isosorbide mononitrate (IMDUR) 30 MG 24 hr tablet Take 30 mg by mouth daily.   meclizine (ANTIVERT) 25 MG tablet Take 25 mg by mouth 3 (three) times daily as needed for dizziness.   nitroGLYCERIN (NITROSTAT) 0.4 MG SL tablet Place 1 tablet (0.4 mg total) under the tongue every 5 (five) minutes as needed for chest pain.   OVER THE COUNTER MEDICATION Take 1 capsule by mouth in the morning and at bedtime. FE/B12/C/FA/SA 150-25-20-1-50 mg   pantoprazole (PROTONIX) 20 MG tablet Take 1 tablet (20 mg total) by mouth 2 (two) times daily.   pioglitazone (ACTOS) 15 MG tablet Take 15 mg by mouth daily.   PRESCRIPTION MEDICATION Compression Hose Knee LG/LNG-XLNG OT Wear hose topically during the day as directed **Limit two (2) pair initially, then two (2) pair every six (6) Months   rosuvastatin (CRESTOR) 20 MG tablet Take 20 mg by mouth daily.   Skin Protectants, Misc. (MINERIN CREME) CREA Apply 1 application. topically 2 (two) times daily. Apply topically to the affected area a thin layer to alleviate skin changes of anti-neoplastic meds   tamsulosin (FLOMAX) 0.4 MG CAPS capsule Take 0.4 mg by mouth 2 (two) times daily.   witch hazel-glycerin (TUCKS) pad Apply 1 application. topically 2 (two) times daily as needed.     Allergies:   Lisinopril   Social History   Socioeconomic History    Marital status: Divorced    Spouse name: Not on file   Number of children: Not on file   Years of education: Not on file   Highest education level: Not on file  Occupational History   Not on  file  Tobacco Use   Smoking status: Never   Smokeless tobacco: Former    Types: Associate Professor Use: Never used  Substance and Sexual Activity   Alcohol use: Not Currently   Drug use: Not Currently   Sexual activity: Not Currently  Other Topics Concern   Not on file  Social History Narrative   Not on file   Social Determinants of Health   Financial Resource Strain: Not on file  Food Insecurity: Not on file  Transportation Needs: Not on file  Physical Activity: Not on file  Stress: Not on file  Social Connections: Not on file     Family History: The patient's family history includes Hypertension in his mother.  ROS:   Please see the history of present illness.    All other systems reviewed and are negative.  EKGs/Labs/Other Studies Reviewed:    The following studies were reviewed today:   Echocardiogram 04/23/23:   1. Left ventricular ejection fraction, by estimation, is 50 to 55%. The  left ventricle has low normal function. The left ventricle has no regional  wall motion abnormalities. There is mild asymmetric left ventricular  hypertrophy of the basal-septal  segment. Left ventricular diastolic parameters are consistent with Grade  II diastolic dysfunction (pseudonormalization). Elevated left atrial  pressure.   2. Right ventricular systolic function is normal. The right ventricular  size is normal. There is normal pulmonary artery systolic pressure.   3. Left atrial size was mildly dilated.   4. The mitral valve is abnormal. Severe mitral valve regurgitation.  Eccentric posterior directed MR jet, previously shown to be flail P2 on  TEE   5. The aortic valve has been repaired/replaced. Aortic valve  regurgitation is mild. There is a 26 mm Edwards Sapien  stented (TAVR)  valve present in the aortic position. Aortic valve mean gradient measures  9.0 mmHg. Mild PVL.   TAVR OPERATIVE NOTE     Date of Procedure:                04/23/2022   Preoperative Diagnosis:      Severe Aortic Stenosis    Postoperative Diagnosis:    Same    Procedure:        Transcatheter Aortic Valve Replacement - Percutaneous Right Transfemoral Approach             Edwards Sapien 3 Ultra ResiliaTHV (size 26 mm, model # 9755RSL, serial # [id number])              Co-Surgeons:                        Alleen Borne, MD and Tonny Bollman, MD       Anesthesiologist:                  Shona Simpson, MD   Echocardiographer:              Charlton Haws, MD   Pre-operative Echo Findings: Severe aortic stenosis Normal left ventricular systolic function Severe mitral regurgitation   Post-operative Echo Findings: No paravalvular leak Normal left ventricular systolic function Unchanged severe mitral regurgitation _____________     Echo 04/24/2022 IMPRESSIONS  1. Left ventricular ejection fraction, by estimation, is 55 to 60%. The  left ventricle has normal function. The left ventricle has no regional  wall motion abnormalities. There is mild asymmetric left ventricular  hypertrophy of the basal and septal  segments. Left  ventricular diastolic parameters are indeterminate.   2. Right ventricular systolic function is normal. The right ventricular  size is normal.   3. Left atrial size was moderately dilated.   4. Eccentric anteriorly directed MR with splay artifact likely posterior  leaflet prolapse . The mitral valve is abnormal. Severe mitral valve  regurgitation. No evidence of mitral stenosis.   5. Tricuspid valve regurgitation is moderate.   6. Post TAVR with 26 mm Sapien 3 valve no significant PVL mean gradient  5.5 mmHg peak 10.2 mmHg DVI 0.59. The aortic valve is normal in structure.  Aortic valve regurgitation is not visualized. No aortic stenosis is  present.  There is a 26 mm Sapien  prosthetic (TAVR) valve present in the aortic position.   7. The inferior vena cava is dilated in size with >50% respiratory  variability, suggesting right atrial pressure of 8 mmHg.    ___________________________   Echo 05/31/22 IMPRESSIONS  1. Left ventricular ejection fraction, by estimation, is 55%. The left ventricle has low normal function. The left ventricle demonstrates regional wall motion abnormalities that are unchanged from prior exam (see scoring diagram/findings for  description). There is mild asymmetric left ventricular hypertrophy of the basal-septal segment. Left ventricular diastolic parameters are consistent with Grade II diastolic dysfunction (pseudonormalization).  2. Right ventricular systolic function is normal. The right ventricular size is normal. There is moderately elevated pulmonary artery systolic pressure. The estimated right ventricular systolic pressure is 48.4 mmHg.  3. Left atrial size was severely dilated.  4. Right atrial size was mildly dilated.  5. The mitral valve is abnormal. Severe mitral valve regurgitation, eccentric and anteriorly directed. Mechanism of MR noted on TEE 08/23/21 as flail P2 scallop of posterior mitral leaflet. No evidence of mitral stenosis. The mean mitral valve gradient is  3.0 mmHg with average heart rate of 74 bpm.  6. Tricuspid valve regurgitation is mild to moderate.  7. The aortic valve has been repaired/replaced. Aortic valve regurgitation is not visualized, no paravalvular leak. There is a 26 mm Sapien prosthetic (TAVR) valve present in the aortic position. Procedure Date: 04/23/22. Echo findings are consistent with  stable structure and function of the aortic valve prosthesis.  8. The inferior vena cava is normal in size with greater than 50% respiratory variability, suggesting right atrial pressure of 3 mmHg.   Recent Labs: No results found for requested labs within last 365 days.  Recent Lipid Panel     Component Value Date/Time   CHOL 119 08/15/2018 0632   TRIG 80 08/15/2018 0632   HDL 34 (L) 08/15/2018 0632   CHOLHDL 3.5 08/15/2018 0632   VLDL 16 08/15/2018 0632   LDLCALC 69 08/15/2018 0632   Physical Exam:    VS:  BP 134/70   Pulse 63   Ht 5\' 8"  (1.727 m)   Wt 199 lb (90.3 kg)   SpO2 95%   BMI 30.26 kg/m     Wt Readings from Last 3 Encounters:  04/23/23 199 lb (90.3 kg)  11/27/22 199 lb (90.3 kg)  05/31/22 186 lb (84.4 kg)    General: Well developed, well nourished, NAD Lungs:Clear to ausculation bilaterally. No wheezes, rales, or rhonchi. Breathing is unlabored. Cardiovascular: RRR with S1 S2. + murmur Extremities: Trace BLE edema. Neuro: Alert and oriented. No focal deficits. No facial asymmetry. MAE spontaneously. Psych: Responds to questions appropriately with normal affect.    ASSESSMENT/PLAN:    Severe AS s/p TAVR: Patient doing ok with NYHA class II symptoms mostly  driven by his MR. Echo today with EF at 50-55% with stable valve function with mean gradient at and mild PVL noted. There is severe mitral valve regurgitation with an eccentric posterior directed MR jet, previously shown to be flail P2 on TEE Continue on Plavix monotherapy. SBE prophylaxis with amoxicillin. Given severe MR, he would like to be considered for TEER. See plan below.    Severe MR: Previously noted on prior imagine. Having increased DOE. Echo today with severe mitral valve regurgitation with an eccentric posterior directed MR jet, previously shown to be flail P2 on TEE. Discussed with Dr. Excell Seltzer and plan for him to review echo for candidacy and potential follow up for consultation.    CAD s/p CABG: Denies anginal symptoms. Pre TAVR cath showed multivessel coronary artery disease with continued patency of the LIMA to LAD graft, continued patency of the left main and left circumflex stents with mild to moderate in-stent restenosis, and continued patency of the RCA with mild stenosis noted.  Continue medical therapy with Plavix, BB and statin.    HTN: Stable with no changes made    Medication Adjustments/Labs and Tests Ordered: Current medicines are reviewed at length with the patient today.  Concerns regarding medicines are outlined above.  No orders of the defined types were placed in this encounter.  No orders of the defined types were placed in this encounter.   Patient Instructions  Medication Instructions:  Your physician recommends that you continue on your current medications as directed. Please refer to the Current Medication list given to you today.  *If you need a refill on your cardiac medications before your next appointment, please call your pharmacy*   Lab Work: NONE If you have labs (blood work) drawn today and your tests are completely normal, you will receive your results only by: MyChart Message (if you have MyChart) OR A paper copy in the mail If you have any lab test that is abnormal or we need to change your treatment, we will call you to review the results.   Testing/Procedures: NONE   Follow-Up: At [facility], you and your health needs are our priority.  As part of our continuing mission to provide you with exceptional heart care, we have created designated Provider Care Teams.  These Care Teams include your primary Cardiologist (physician) and Advanced Practice Providers (APPs -  Physician Assistants and Nurse Practitioners) who all work together to provide you with the care you need, when you need it.  We recommend signing up for the patient portal called "MyChart".  Sign up information is provided on this After Visit Summary.  MyChart is used to connect with patients for Virtual Visits (Telemedicine).  Patients are able to view lab/test results, encounter notes, upcoming appointments, etc.  Non-urgent messages can be sent to your provider as well.   To learn more about what you can do with MyChart, go to [email or web address].     Your next appointment:   WILL CONTACT WITH A CONSULT WITH DR. Excell Seltzer FOR MITRAL CLIP   Signed, Georgie Chard, NP  04/24/2023 11:20 AM    [facility]

## 2023-04-23 ENCOUNTER — Ambulatory Visit (HOSPITAL_BASED_OUTPATIENT_CLINIC_OR_DEPARTMENT_OTHER)

## 2023-04-23 ENCOUNTER — Ambulatory Visit: Attending: Physician Assistant | Admitting: Cardiology

## 2023-04-23 VITALS — BP 134/70 | HR 63 | Ht 68.0 in | Wt 199.0 lb

## 2023-04-23 DIAGNOSIS — I350 Nonrheumatic aortic (valve) stenosis: Secondary | ICD-10-CM | POA: Insufficient documentation

## 2023-04-23 DIAGNOSIS — Z952 Presence of prosthetic heart valve: Secondary | ICD-10-CM

## 2023-04-23 DIAGNOSIS — E785 Hyperlipidemia, unspecified: Secondary | ICD-10-CM | POA: Diagnosis present

## 2023-04-23 DIAGNOSIS — I340 Nonrheumatic mitral (valve) insufficiency: Secondary | ICD-10-CM | POA: Insufficient documentation

## 2023-04-23 DIAGNOSIS — R0609 Other forms of dyspnea: Secondary | ICD-10-CM | POA: Diagnosis present

## 2023-04-23 DIAGNOSIS — Z951 Presence of aortocoronary bypass graft: Secondary | ICD-10-CM | POA: Insufficient documentation

## 2023-04-23 DIAGNOSIS — I10 Essential (primary) hypertension: Secondary | ICD-10-CM | POA: Insufficient documentation

## 2023-04-23 NOTE — Patient Instructions (Signed)
Medication Instructions:  Your physician recommends that you continue on your current medications as directed. Please refer to the Current Medication list given to you today.  *If you need a refill on your cardiac medications before your next appointment, please call your pharmacy*   Lab Work: NONE If you have labs (blood work) drawn today and your tests are completely normal, you will receive your results only by: MyChart Message (if you have MyChart) OR A paper copy in the mail If you have any lab test that is abnormal or we need to change your treatment, we will call you to review the results.   Testing/Procedures: NONE   Follow-Up: At [facility], you and your health needs are our priority.  As part of our continuing mission to provide you with exceptional heart care, we have created designated Provider Care Teams.  These Care Teams include your primary Cardiologist (physician) and Advanced Practice Providers (APPs -  Physician Assistants and Nurse Practitioners) who all work together to provide you with the care you need, when you need it.  We recommend signing up for the patient portal called "MyChart".  Sign up information is provided on this After Visit Summary.  MyChart is used to connect with patients for Virtual Visits (Telemedicine).  Patients are able to view lab/test results, encounter notes, upcoming appointments, etc.  Non-urgent messages can be sent to your provider as well.   To learn more about what you can do with MyChart, go to [email or web address].    Your next appointment:   WILL CONTACT WITH A CONSULT WITH DR. Excell Seltzer FOR MITRAL CLIP

## 2023-04-24 LAB — ECHOCARDIOGRAM COMPLETE
AR max vel: 2.92 cm2
AV Area VTI: 3.06 cm2
AV Area mean vel: 3.07 cm2
AV Mean grad: 9 mmHg
AV Peak grad: 17.1 mmHg
Ao pk vel: 2.07 m/s
Area-P 1/2: 2.53 cm2
Height: 68 in
MV M vel: 5.1 m/s
MV Peak grad: 104 mmHg
MV VTI: 0.78 cm2
Radius: 0.85 cm
S' Lateral: 3.6 cm
Weight: 3184 oz

## 2023-05-09 ENCOUNTER — Telehealth: Payer: Self-pay | Admitting: Cardiology

## 2023-05-09 NOTE — Telephone Encounter (Signed)
Called [employer] [facility] to schedule Miguel Garcia for cardiology follow up. Informed that we will continue to watch his symptoms for now before pursuing TEER for severe MR given that he has been relatively stable on last office evaluation. He is currently scheduled to see Dr. Tresa Endo 11/24/23 at 0900. Structural Heart office number shared withy correctional medical team if symptoms worsen and he needs to be seen in consultation between now and then.   Georgie Chard NP-C Structural Heart Team  Pager: [phone] Phone: [phone]

## 2023-06-18 ENCOUNTER — Telehealth: Payer: Self-pay | Admitting: Cardiovascular Disease

## 2023-06-18 NOTE — Telephone Encounter (Signed)
Call ot office, went to her VM.  LVM to please return call

## 2023-06-18 NOTE — Telephone Encounter (Signed)
[facility] called  regarding TEER.  Advised per last message   [facility] to schedule Miguel Garcia for cardiology follow up. Informed that we will continue to watch his symptoms for now before pursuing TEER for severe MR given that he has been relatively stable on last office evaluation. He is currently scheduled to see Dr. Tresa Endo 11/24/23 at 0900. Structural Heart office number shared withy correctional medical team if symptoms worsen and he needs to be seen in consultation between now and then      She states she will note information to provider there. Nothing further needed at this time.

## 2023-06-18 NOTE — Telephone Encounter (Signed)
[employer], [place], is calling to get procedure scheduled for patient. Requesting return call.

## 2023-06-24 ENCOUNTER — Telehealth: Payer: Self-pay | Admitting: Cardiovascular Disease

## 2023-06-24 NOTE — Telephone Encounter (Signed)
Calling to see if patient needs to be seen by Dr. Excell Seltzer. And if not then they need the note that stated that he doesn't. Please advise

## 2023-06-24 NOTE — Telephone Encounter (Signed)
Spoke with Lucita Lora and she states she will need letter stating patient will not need to see Dr. Excell Seltzer. He will not have procedure at this time, we will just monitor patient for now. Patient will see Dr. Tresa Endo 11/24/23 at 9:00 am.  See Jill's NP phone note from 05/09/23

## 2023-07-22 NOTE — Telephone Encounter (Signed)
Patient was seen by Miguel Garcia in May 2024.  Please refer to her for response from structural heart team.

## 2023-11-24 ENCOUNTER — Ambulatory Visit: Attending: Cardiovascular Disease | Admitting: Cardiovascular Disease

## 2023-11-25 ENCOUNTER — Encounter: Payer: Self-pay | Admitting: Cardiovascular Disease

## 2023-11-28 ENCOUNTER — Telehealth: Payer: Self-pay

## 2023-11-28 NOTE — Telephone Encounter (Signed)
Patient came into office on 11-28-23.  Nurse with him had paperwork that had been changed to 11-28-23 for the appt.  Could find no phone exchange that would confirm this change.  Checked w the DOD and was unable to work patient in for the day.  Resch the patient w 1st available and mail the reminder.  Kelly patient and appt was for 1 yr f/u.  Patient is inmate in [place]  11-28-23 VB

## 2023-12-28 NOTE — Progress Notes (Signed)
 Cardiology Office Note    Date:  12/31/2023  ID:  Miguel Garcia, DOB September 18, 1946, MRN [id number] PCP:  Patient, No Pcp Per  Cardiologist:  Debby Sor, MD  Electrophysiologist:  None   Chief Complaint: Follow up CAD and severe mitral regurgitation   History of Present Illness: .    Miguel Garcia is a 78 y.o. male with visit-pertinent history of coronary artery disease with multiple PCI's and single-vessel CABG in 2007 with subsequent PCI to left main to LCx in 03/2018 and PTCA of in-stent restenosis of left main in 07/2018 severe aortic stenosis s/p TAVR in 04/2022, mitral regurgitation, hyperlipidemia, hypertension, prostate cancer, type 2 diabetes mellitus.   Patient with remote history of CABG in 2003.  He then had subsequent PCI with DES to left main to LCx in 03/2018 after presenting with unstable angina and being found to have an abnormal stress test.  He was admitted again in 07/2018 with chest pain, repeat cardiac catheterization showed 80% in-stent restenosis of left main which was treated with PTCA.  In 2021 he was found to have recurrent prostate cancer on PET scan, started on Xtandi  hormone therapy and leuprolide injections.  He had complaints of hematochezia at that time and underwent colonoscopy in 10/2020 which showed multiple colonic angio ectasias with bleeding which were managed with APC.  His Brilinta  was transitioned to Plavix  and atorvastatin  through a statin due to potential drug interactions with his hormone therapy.  An echocardiogram in 03/2021 showed a trileaflet aortic valve with severe thickening and calcification.  Mean gradient was measured at 26 mmHg with a peak gradient of 45 mmHg.  The mitral valve was noted to be mild eczematous with prolapse of the P2 segment with moderate to severe mitral regurgitation with an eccentric anteriorly directed jet.  A TEE was done on 08/23/2021 which showed severe calcification of the aortic valve with a mean gradient of 30 mmHg and a  valve area of 0.7 cm by VTI.  There is severe eccentric mitral valve regurgitation with a flail P2 segment of the posterior leaflet.  There is a small mobile lesion on the atrial surface of the mitral valve suspicious for possible old endocarditis.  There was a pulmonary vein blunting and systolic reversal noted consistent with severe regurgitation.  LVEF was 55 to 60%.  He subsequently underwent cardiac catheterization on 02/01/2022 showing multivessel coronary disease with a patent LIMA to LAD and continued patency of the left main and left circumflex stents with mild to moderate in-stent restenosis.  There was continued patency of the RCA with mild stenosis.  There was severe aortic stenosis with a mean gradient across the valve of 43 mmHg and a peak to peak gradient of 51 mmHg.  Calculated valve area was 0.77 cm.  There was moderate pulmonary hypertension with a PA pressure of 59/22 with a mean of 36 and a large V wave of 24 mmHg consistent with severe mitral regurgitation.  On 04/23/2022 he underwent successful TAVR with a 26 mm Edwards SAPIEN 3 ultra Resilia THV via the TF approach.  Postoperative echo showed EF 55%, normally functioning TAVR with a mean gradient of 5.5 mmHg and no PVL as well as severe MR-eccentric anterior directed MR with splay artifact likely posterior leaflet prolapse.  Following procedure he had a new left bundle branch block, he was discharged with a ZIO AT but this never function correctly on the send back to the company.  1 month TAVR echo showed severe eccentric MR and  TER was briefly discussed although he was felt symptomatically stable at that time.  Today he presents for follow-up.  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  He reports that he is overall doing okay, he has noted some further worsening of his dyspnea on exertion in the last few months.  He notes that he now struggles trying to walk to the med center and occasionally requires assistance.  He has to sleep  propped up and has some discomfort when laying down flat.  His Lasix  has been increased to every other day with improvement in symptoms per patient, however his exercise tolerance continues to be decreased.  He notes that his lower extremity edema had worsened although also improved with Lasix  every other day.  He denies any anginal chest pain, palpitations, presyncope or syncope.  He notes some slight dizziness with position changes that resolves after a few seconds.  Labwork independently reviewed: 12/04/2023: Hemoglobin 12, hematocrit 34.9, sodium 141, potassium 3.9, creatinine 0.99  ROS: .   Today he denies chest pain, palpitations, melena, hematuria, hemoptysis, diaphoresis, weakness, presyncope, syncope, orthopnea, and PND.  All other systems are reviewed and otherwise negative. Studies Reviewed: SABRA    EKG:  EKG is ordered today, personally reviewed, demonstrating  EKG Interpretation Date/Time:  Wednesday December 31 2023 10:34:14 EST Ventricular Rate:  67 PR Interval:  210 QRS Duration:  154 QT Interval:  464 QTC Calculation: 490 R Axis:   -33  Text Interpretation: Sinus rhythm with 1st degree A-V block Left axis deviation Left bundle branch block No significant change since last tracing Confirmed by Haden Suder [phone]) on 12/31/2023 11:28:37 AM   CV Studies:  Cardiac Studies & Procedures   CARDIAC CATHETERIZATION  CARDIAC CATHETERIZATION 02/01/2022  Narrative 1.  Multivessel coronary artery disease with continued patency of the LIMA to LAD graft, continued patency of the left main and left circumflex stents with mild to moderate in-stent restenosis, and continued patency of the RCA with mild stenosis noted. 2.  Severe aortic stenosis with a mean transvalvular gradient of 43 mmHg, peak to peak gradient of 51 mmHg, and calculated aortic valve area of 0.77 cm 3.  Moderate pulmonary hypertension with PA pressure of 59/22, mean of 36, large V wave of 24 mmHg consistent with significant  mitral regurgitation  Recommendations: Complex patient.  Coronary anatomy is stable and plan medical therapy for CAD.  Patient with severe multivalve disease with aortic stenosis and mitral regurgitation.  We will review with multidisciplinary heart valve team, but favor TAVR followed by transcatheter mitral valve repair if needed.  Patient will need CTA studies and valve clinic follow-up.  Findings Coronary Findings Diagnostic  Dominance: Right  Left Main Mid LM to Dist LM lesion is 40% stenosed. The lesion was previously treated . Previously treated with stenting and balloon angioplasty for in-stent restenosis.  Left Anterior Descending Ost LAD to Prox LAD lesion is 100% stenosed. Prox LAD to Mid LAD lesion is 10% stenosed. The lesion was previously treated .  First Diagonal Branch 1st Diag lesion is 50% stenosed.  Left Circumflex The circumflex is extensively stented all the way back to the left main.  The left main/circumflex interface has been previously stented and then redilated for treatment of in-stent restenosis.  There is 40 to 50% restenosis in that area.  The proximal circumflex is patent to the obtuse marginal which has been treated with balloon angioplasty of the ostium is patent with moderate ostial stenosis.  The mid and distal circumflex  beyond the OM have mild diffuse in-stent restenosis extending into a small terminal OM branch which is diffusely diseased. Non-stenotic Ost Cx to Prox Cx lesion was previously treated. Non-stenotic Prox Cx lesion was previously treated. Prox Cx to Mid Cx lesion is 30% stenosed. The lesion was previously treated . Diffuse mild in-stent restenosis noted Non-stenotic Mid Cx to Dist Cx lesion was previously treated.  Second Obtuse Marginal Branch Ost 2nd Mrg lesion is 50% stenosed. The lesion was previously treated .  Third Obtuse Marginal Branch Ost 3rd Mrg lesion is 40% stenosed.  Right Coronary Artery Prox RCA lesion is 50% stenosed.  Lesion appears nonobstructive and moderate at most in the 40 to 50% range. Mid RCA lesion is 50% stenosed. The lesion is moderately calcified.  LIMA Graft To Mid LAD The LIMA to LAD graft remains widely patent.  Intervention  No interventions have been documented.   CARDIAC CATHETERIZATION  CARDIAC CATHETERIZATION 08/17/2018  Narrative  Ost LAD to Prox LAD lesion is 100% stenosed.  1st Diag lesion is 50% stenosed.  Prox LAD to Mid LAD lesion is 10% stenosed.  Ost 3rd Mrg lesion is 40% stenosed.  Previously placed Ost Cx to Prox Cx stent (unknown type) is widely patent.  Previously placed Prox Cx stent (unknown type) is widely patent.  Prox RCA lesion is 35% stenosed.  Mid RCA lesion is 30% stenosed.  Mid LM to Dist LM lesion is 80% stenosed.  Prox Cx to Mid Cx lesion is 15% stenosed.  A stent was successfully placed.  Post intervention, there is a 0% residual stenosis.  Mid Cx to Dist Cx lesion is 90% stenosed.  Post intervention, there is a 0% residual stenosis.  Ost 2nd Mrg lesion is 85% stenosed.  Post intervention, there is a 10% residual stenosis.  Post intervention, there is a 50% residual stenosis.  Significant of coronary obstructive disease with 80% in-stent restenosis in the distal left main stent.  Old total ostial occlusion of the LAD.  Patent proximal left circumflex stent with 15% intimal hyperplasia in the mid circumflex stent followed by eccentric 90% stenosis distal to the mid circumflex stent and 85% ostial stenosis in the OM1 vessel which is jailed by stent overlap of the proximal and mid  previously placed stents.  Dominant RCA with 35% smooth stenosis proximally and 30% stenosis in the mid segment.  Right and left internal mammary artery supplying the mid LAD with 50% narrowing in the diagonal vessel and patent proximal LAD stent.  Successful multilesion intervention with Cutting Balloon/PTCA of the distal left main stent with the 80%  stenosis being reduced to less than 15%; successful stenting of the distal circumflex with insertion of a 2.0 x 12 mm Resolute DES stent postdilated to 2.25 mm with a 90% stenosis being reduced to 0%, and PTCA of the ostium of the OM1 vessel eccentric 85% stenosis reduced  to 50%.  RECOMMENDATION: Medical therapy for concomitant CAD.  High potency statin therapy with target LDL less than 70.  Optimal blood pressure control.  Recommend follow-up 2D echo Doppler study to reassess LV function. Recommend dual antiplatelet therapy with ASA 81 mg and Ticagrelor  90mg  twice daily long-term (beyond 12 months) because of multiple stents and left main involvement in this patient status post prior CABG revascularization.  Findings Coronary Findings Diagnostic  Dominance: Right  Left Main Mid LM to Dist LM lesion is 80% stenosed. The lesion was previously treated.  Left Anterior Descending Ost LAD to Prox LAD lesion is  100% stenosed. Prox LAD to Mid LAD lesion is 10% stenosed. The lesion was previously treated.  First Diagonal Branch 1st Diag lesion is 50% stenosed.  Left Circumflex Previously placed Ost Cx to Prox Cx stent (unknown type) is widely patent. Previously placed Prox Cx stent (unknown type) is widely patent. Prox Cx to Mid Cx lesion is 15% stenosed. The lesion was previously treated. Mid Cx to Dist Cx lesion is 90% stenosed.  Second Obtuse Marginal Branch Ost 2nd Mrg lesion is 85% stenosed.  Third Obtuse Marginal Branch Ost 3rd Mrg lesion is 40% stenosed.  Right Coronary Artery Prox RCA lesion is 35% stenosed. Mid RCA lesion is 30% stenosed.  LIMA Graft To Mid LAD  Intervention  Mid LM to Dist LM lesion Angioplasty Post-Intervention Lesion Assessment The intervention was successful. Pre-interventional TIMI flow is 3. Post-intervention TIMI flow is 3. No complications occurred at this lesion. There is a 10% residual stenosis post intervention.  Prox Cx to Mid Cx  lesion Stent (Also treats lesions: Mid Cx to Dist Cx) A stent was successfully placed. Post-Intervention Lesion Assessment The intervention was successful. Intentional subintimal strategy was used. Pre-interventional TIMI flow is 3. Post-intervention TIMI flow is 3. No complications occurred at this lesion. There is a 0% residual stenosis post intervention.  Mid Cx to Dist Cx lesion Stent (Also treats lesions: Prox Cx to Mid Cx) See details in Prox Cx to Mid Cx lesion. Post-Intervention Lesion Assessment The intervention was successful. Pre-interventional TIMI flow is 3. Post-intervention TIMI flow is 3. There is a 0% residual stenosis post intervention.  Ost 2nd Mrg lesion Angioplasty Post-Intervention Lesion Assessment The intervention was successful. Pre-interventional TIMI flow is 3. Post-intervention TIMI flow is 3. No complications occurred at this lesion. There is a 50% residual stenosis post intervention.    ECHOCARDIOGRAM  ECHOCARDIOGRAM COMPLETE 04/23/2023  Narrative ECHOCARDIOGRAM REPORT    Patient Name:   Miguel Garcia Date of Exam: 04/23/2023 Medical Rec #:  [id number]      Height:       68.0 in Accession #:    [id number]     Weight:       199.0 lb Date of Birth:  [birth year]      BSA:          2.039 m Patient Age:    76 years       BP:           124/73 mmHg Patient Gender: M              HR:           74 bpm. Exam Location:  [address]  Procedure: 2D Echo, 3D Echo, Cardiac Doppler, Color Doppler and Strain Analysis  Indications:    Z95.2 s/p TAVR  History:        Patient has prior history of Echocardiogram examinations. CAD, Prior CABG, Arrythmias:LBBB, Signs/Symptoms:1-2/6 systolic murmur aortic LSB,2-3/6 apical systolic murmur with MR; Risk Factors:Hypertension and Diabetes. Aortic Valve: 26 mm Sapien prosthetic, stented (TAVR) valve is present in the aortic position.  Sonographer:    Nolon Berg [facility], RDCS Referring Phys: [id number] LAMARR SAUNDERS  THOMPSON  IMPRESSIONS   1. Left ventricular ejection fraction, by estimation, is 50 to 55%. The left ventricle has low normal function. The left ventricle has no regional wall motion abnormalities. There is mild asymmetric left ventricular hypertrophy of the basal-septal segment. Left ventricular diastolic parameters are consistent with Grade II diastolic dysfunction (pseudonormalization). Elevated left atrial pressure. 2. Right ventricular systolic  function is normal. The right ventricular size is normal. There is normal pulmonary artery systolic pressure. 3. Left atrial size was mildly dilated. 4. The mitral valve is abnormal. Severe mitral valve regurgitation. Eccentric posterior directed MR jet, previously shown to be flail P2 on TEE 5. The aortic valve has been repaired/replaced. Aortic valve regurgitation is mild. There is a 26 mm Edwards Sapien stented (TAVR) valve present in the aortic position. Aortic valve mean gradient measures 9.0 mmHg. Mild PVL.  FINDINGS Left Ventricle: Left ventricular ejection fraction, by estimation, is 50 to 55%. The left ventricle has low normal function. The left ventricle has no regional wall motion abnormalities. The left ventricular internal cavity size was normal in size. There is mild asymmetric left ventricular hypertrophy of the basal-septal segment. Left ventricular diastolic parameters are consistent with Grade II diastolic dysfunction (pseudonormalization). Elevated left atrial pressure.  Right Ventricle: The right ventricular size is normal. No increase in right ventricular wall thickness. Right ventricular systolic function is normal. There is normal pulmonary artery systolic pressure. The tricuspid regurgitant velocity is 2.81 m/s, and with an assumed right atrial pressure of 3 mmHg, the estimated right ventricular systolic pressure is 34.6 mmHg.  Left Atrium: Left atrial size was mildly dilated.  Right Atrium: Right atrial size was normal in  size.  Pericardium: There is no evidence of pericardial effusion.  Mitral Valve: The mitral valve is abnormal. Severe mitral valve regurgitation.  Tricuspid Valve: The tricuspid valve is normal in structure. Tricuspid valve regurgitation is mild.  Aortic Valve: The aortic valve has been repaired/replaced. Aortic valve regurgitation is mild. Aortic valve mean gradient measures 9.0 mmHg. Aortic valve peak gradient measures 17.1 mmHg. Aortic valve area, by VTI measures 3.06 cm. There is a 26 mm Sapien prosthetic, stented (TAVR) valve present in the aortic position.  Pulmonic Valve: The pulmonic valve was not well visualized. Pulmonic valve regurgitation is not visualized.  Aorta: The aortic root and ascending aorta are structurally normal, with no evidence of dilitation.  IAS/Shunts: The interatrial septum was not well visualized.   LEFT VENTRICLE PLAX 2D LVIDd:         5.00 cm   Diastology LVIDs:         3.60 cm   LV e' medial:    5.55 cm/s LV PW:         1.00 cm   LV E/e' medial:  26.8 LV IVS:        1.10 cm   LV e' lateral:   11.60 cm/s LVOT diam:     2.60 cm   LV E/e' lateral: 12.8 LV SV:         126 LV SV Index:   62        2D Longitudinal Strain LVOT Area:     5.31 cm  2D Strain GLS (A2C):   -16.7 % 2D Strain GLS (A3C):   -18.0 % 2D Strain GLS (A4C):   -18.7 % 2D Strain GLS Avg:     -17.8 %  3D Volume EF: 3D EF:        56 % LV EDV:       181 ml LV ESV:       79 ml LV SV:        102 ml  RIGHT VENTRICLE RV Basal diam:  4.00 cm RV Mid diam:    3.10 cm RV S prime:     11.20 cm/s TAPSE (M-mode): 3.2 cm RVSP:  34.6 mmHg  LEFT ATRIUM             Index        RIGHT ATRIUM           Index LA diam:        5.80 cm 2.84 cm/m   RA Pressure: 3.00 mmHg LA Vol (A2C):   78.4 ml 38.44 ml/m  RA Area:     19.70 cm LA Vol (A4C):   70.1 ml 34.37 ml/m  RA Volume:   54.40 ml  26.67 ml/m LA Biplane Vol: 73.8 ml 36.19 ml/m AORTIC VALVE AV Area (Vmax):    2.92 cm AV Area  (Vmean):   3.07 cm AV Area (VTI):     3.06 cm AV Vmax:           207.00 cm/s AV Vmean:          131.000 cm/s AV VTI:            0.413 m AV Peak Grad:      17.1 mmHg AV Mean Grad:      9.0 mmHg LVOT Vmax:         114.00 cm/s LVOT Vmean:        75.800 cm/s LVOT VTI:          0.238 m LVOT/AV VTI ratio: 0.58  AORTA Ao Root diam: 2.90 cm Ao Asc diam:  3.70 cm  MITRAL VALVE                  TRICUSPID VALVE MV Area (PHT): 2.53 cm       TR Peak grad:   31.6 mmHg MV Area VTI:   0.78 cm       TR Vmax:        281.00 cm/s MV VTI:        1.63 m         Estimated RAP:  3.00 mmHg MV Decel Time: 300 msec       RVSP:           34.6 mmHg MR Peak grad:    104.0 mmHg MR Mean grad:    64.0 mmHg    SHUNTS MR Vmax:         510.00 cm/s  Systemic VTI:  0.24 m MR Vmean:        378.5 cm/s   Systemic Diam: 2.60 cm MR PISA Nyquist: 0.3 m/s MR PISA:         4.54 cm MR PISA Eff ROA: 27 mm MR PISA Radius:  0.85 cm MV E velocity: 148.50 cm/s MV A velocity: 95.00 cm/s MV E/A ratio:  1.56  Lonni Nanas MD Electronically signed by Lonni Nanas MD Signature Date/Time: 04/24/2023/11:58:52 PM    Final  TEE  ECHO TEE 08/23/2021  Narrative TRANSESOPHOGEAL ECHO REPORT    Patient Name:   Miguel Garcia Date of Exam: 08/23/2021 Medical Rec #:  [id number]      Height:       69.0 in Accession #:    [id number]     Weight:       193.0 lb Date of Birth:  [birth year]      BSA:          2.035 m Patient Age:    75 years       BP:           165/91 mmHg Patient Gender: M              HR:  81 bpm. Exam Location:  Inpatient  Procedure: 3D Echo, Transesophageal Echo, Cardiac Doppler, Color Doppler and Saline Contrast Bubble Study  MODIFIED REPORT: This report was modified by Soyla Merck MD on 08/24/2021 due to resend to EMR, no changes made. Indications:     I34.0 Nonrheumatic mitral (valve) insufficiency; I34.1 Nonrheumatic mitral (valve) prolapse; I35.0 Nonrheumatic aortic (valve)  stenosis  History:         Patient has prior history of Echocardiogram examinations. CAD, Prior CABG, Mitral Valve Disease and Aortic Valve Disease; Risk Factors:Hypertension, Diabetes and Dyslipidemia. Mitral regurgitation. Aortic stenosis.  Sonographer:     Ellouise Mose RDCS Referring Phys:  SOYLA DELENA MERCK Diagnosing Phys: Soyla Merck MD  PROCEDURE: After discussion of the risks and benefits of a TEE, an informed consent was obtained from the patient. The transesophogeal probe was passed without difficulty through the esophogus of the patient. Imaged were obtained with the patient in a left lateral decubitus position. Sedation performed by different physician. The patient was monitored while under deep sedation. Anesthestetic sedation was provided intravenously by Anesthesiology: 367.5mg  of Propofol . The patient's vital signs; including heart rate, blood pressure, and oxygen saturation; remained stable throughout the procedure. The patient developed no complications during the procedure.  IMPRESSIONS   1. Left ventricular ejection fraction, by estimation, is 55 to 60%. The left ventricle has normal function. The left ventricle demonstrates regional wall motion abnormalities (see scoring diagram/findings for description). 2. Right ventricular systolic function is normal. The right ventricular size is normal. 3. Left atrial size was severely dilated. No left atrial/left atrial appendage thrombus was detected. The LAA emptying velocity was 61 cm/s. 4. Right atrial size was mildly dilated. 5. Severe, eccentric mitral valve regurgitation. There is a flail segment on the posterior mitral leaflet on the P2 scallop with degenerative change/calcification. There is also a small independently mobile lesion on the atrial surface of the mitral valve. Cannot exclude degenerative change from prior endocarditis (best seen in clip 68). Severe and eccentric anteriorly direct jet of mitral valve  regurgitation. PV blunting and systolic reversal noted. The mitral valve is abnormal. Severe mitral valve regurgitation. No evidence of mitral stenosis. 6. The aortic valve is abnormal. There is severe calcifcation of the aortic valve. Aortic valve regurgitation is mild. Moderate to severe aortic valve stenosis. Aortic valve area, by VTI measures 0.70 cm. Aortic valve mean gradient measures 30.5 mmHg. Aortic valve Vmax measures 3.55 m/s. 7. Cannot exclude small PFO with left to right shunt by color flow.. Agitated saline contrast bubble study was negative, with no evidence of right to left shunt. 8. There is Moderate (Grade III) atheroma plaque involving the transverse and descending aorta.  Conclusion(s)/Recommendation(s): Consider inflammatory markers and blood cultures to exclude endocarditis.  FINDINGS Left Ventricle: Left ventricular ejection fraction, by estimation, is 55 to 60%. The left ventricle has normal function. The left ventricle demonstrates regional wall motion abnormalities. The left ventricular internal cavity size was normal in size.   LV Wall Scoring: The antero-lateral wall is hypokinetic.  Right Ventricle: The right ventricular size is normal. No increase in right ventricular wall thickness. Right ventricular systolic function is normal.  Left Atrium: Left atrial size was severely dilated. No left atrial/left atrial appendage thrombus was detected. The LAA emptying velocity was 61 cm/s.  Right Atrium: Right atrial size was mildly dilated.  Pericardium: There is no evidence of pericardial effusion.  Mitral Valve: Severe, eccentric mitral valve regurgitation. There is a flail segment on the posterior mitral leaflet on  the P2 scallop with degenerative change/calcification. There is also a small independently mobile lesion on the atrial surface of the mitral valve. Cannot exclude degenerative change from prior endocarditis (best seen in clip 68). Severe and eccentric  anteriorly direct jet of mitral valve regurgitation. PV blunting and systolic reversal noted. The mitral valve is abnormal. Severe mitral valve regurgitation. No evidence of mitral valve stenosis.  Tricuspid Valve: The tricuspid valve is grossly normal. Tricuspid valve regurgitation is mild.  Aortic Valve: The aortic valve is abnormal. There is severe calcifcation of the aortic valve. Aortic valve regurgitation is mild. Moderate to severe aortic stenosis is present. Aortic valve mean gradient measures 30.5 mmHg. Aortic valve peak gradient measures 50.4 mmHg. Aortic valve area, by VTI measures 0.70 cm.  Pulmonic Valve: The pulmonic valve was normal in structure. Pulmonic valve regurgitation is trivial.  Aorta: The aortic root and ascending aorta are structurally normal, with no evidence of dilitation. There is moderate (Grade III) atheroma plaque involving the transverse and descending aorta.  IAS/Shunts: Cannot exclude small PFO with left to right shunt by color flow. Agitated saline contrast was given intravenously to evaluate for intracardiac shunting. Agitated saline contrast bubble study was negative, with no evidence of any interatrial shunt.   LEFT VENTRICLE PLAX 2D LVOT diam:     2.10 cm LV SV:         57 LV SV Index:   28 LVOT Area:     3.46 cm   AORTIC VALVE AV Area (Vmax):    0.73 cm AV Area (Vmean):   0.69 cm AV Area (VTI):     0.70 cm AV Vmax:           355.00 cm/s AV Vmean:          262.000 cm/s AV VTI:            0.812 m AV Peak Grad:      50.4 mmHg AV Mean Grad:      30.5 mmHg LVOT Vmax:         74.86 cm/s LVOT Vmean:        52.164 cm/s LVOT VTI:          0.164 m LVOT/AV VTI ratio: 0.20  AORTA Ao Root diam: 3.30 cm Ao Asc diam:  3.50 cm  MR Peak grad:    154.4 mmHg MR Mean grad:    81.0 mmHg   SHUNTS MR Vmax:         621.26 cm/s Systemic VTI:  0.16 m MR Vmean:        442.9 cm/s  Systemic Diam: 2.10 cm MR PISA Nyquist: 3.9 m/s MR PISA:         9.05  cm MR PISA Eff ROA: 56 mm MR PISA Radius:  1.20 cm  Soyla Merck MD Electronically signed by Soyla Merck MD Signature Date/Time: 08/23/2021/11:59:38 AM    Final (Updated)  MONITORS  LONG TERM MONITOR-LIVE TELEMETRY (3-14 DAYS) 06/07/2022  Narrative Patch Wear Time:  8 days and 2 hours (2023-05-03T11:29:40-0400 to 2023-05-11T14:16:57-0400)  Patient had a min HR of 49 bpm, max HR of 179 bpm, and avg HR of 85 bpm. Predominant underlying rhythm was Sinus Rhythm. Bundle Branch Block/IVCD was present. QRS morphology changes were present throughout recording. 1 run of Ventricular Tachycardia occurred lasting 13 beats with a max rate of 179 bpm (avg 118 bpm). 5 Supraventricular Tachycardia runs occurred, the run with the fastest interval lasting 4 beats with a max rate of 148 bpm, the  longest lasting 17 beats with an avg rate of 111 bpm. Second Degree AV Block-Mobitz I (Wenckebach) was present. Isolated SVEs were rare (<1.0%), SVE Couplets were rare (<1.0%), and SVE Triplets were rare (<1.0%). Isolated VEs were rare (<1.0%), VE Couplets were rare (<1.0%), and no VE Triplets were present. Ventricular Bigeminy and Trigeminy were present.  Zio patch monitor demonstrated predominant sinus rhythm with an average heart rate 85 bpm.  Patient had a 13 beat episode of ventricular tachycardia with a rate from 86 to 179 bpm average 118, which occurred on Apr 26, 2022 at 10:26 PM.  There was a brief episode of second-degree Mobitz type I block on May 6 at 3:17 AM.  There were 5 episodes of SVT with the fastest at a maximum rate of 148 bpm lasting 4 beats and the longest at 17 beats with an average rate at 111 bpm.  There was rare isolated ectopy with brief ventricular bigeminy and trigeminy.  CT SCANS  CT CORONARY MORPH W/CTA COR W/SCORE 02/27/2022  Addendum 02/28/2022  5:03 AM ADDENDUM REPORT: 02/28/2022 05:01  ADDENDUM: Extracardiac findings were described separately under dictation  for contemporaneously obtained CTA chest, abdomen and pelvis. Please see separate dictation for contemporaneously obtained CTA chest, abdomen and pelvis 02/27/2022 for full description of relevant extracardiac findings.   Electronically Signed By: Toribio Aye M.D. On: 02/28/2022 05:01  Narrative CLINICAL DATA:  Aortic Stenosis  EXAM: Cardiac TAVR CT  TECHNIQUE: The patient was scanned on a Siemens Force 192 slice scanner. A 120 kV retrospective scan was triggered in the ascending thoracic aorta at 140 HU's. Gantry rotation speed was 250 msecs and collimation was .6 mm. No beta blockade or nitro were given. The 3D data set was reconstructed in 5% intervals of the R-R cycle. Systolic and diastolic phases were analyzed on a dedicated work station using MPR, MIP and VRT modes. The patient received 80 cc of contrast.  FINDINGS: Aortic Valve: Calcified tri leaflet AV with score 3751  Aorta: No aneurysm mild calcific atherosclerosis Bovine arch with tortuous right subclavian and carotid  Sino-tubular Junction: 28 mm  Ascending Thoracic Aorta: 34 mm  Descending Thoracic Aorta: 27 mm  Sinus of Valsalva Measurements:  Non-coronary: 32.9 mm  Right - coronary: 29 mm  Left -   coronary: 30.3 mm  Coronary Artery Height above Annulus:  Left Main: 11.7 mm above annulus  Right Coronary: 16.5 mm above annulus  Virtual Basal Annulus Measurements:  Maximum / Minimum Diameter: 28.2 mm x 20.8 mm  Perimeter: 78.3 mm  Area: 503 mm 2  Coronary Arteries: Sufficient height above annulus for deployment with patent LIMA to LAD  Optimum Fluoroscopic Angle for Delivery: LAO 3 Caudal 5 degrees  IMPRESSION: 1 Calcified tri leaflet AV with score 3751  2.  Optimum angiographic angle for deployment LAO 3 Caudal 5 degrees  3. Coronary arteries sufficient height above annulus for deployment with patent LIMA to LAD  4.  Membranous septal length 11.6 mm  5.  Annular area of  503 mm2 suitable for a 26 mm Sapien 3 valve  Maude Emmer  Electronically Signed: By: Maude Emmer M.D. On: 02/27/2022 12:29          Current Reported Medications:.    Current Meds  Medication Sig   acetaminophen  (TYLENOL ) 325 MG tablet Take 650 mg by mouth 3 (three) times daily as needed for moderate pain.   albuterol  (PROVENTIL  HFA;VENTOLIN  HFA) 108 (90 Base) MCG/ACT inhaler Inhale 2 puffs into the lungs  4 (four) times daily as needed for wheezing or shortness of breath.   albuterol  (PROVENTIL ) (2.5 MG/3ML) 0.083% nebulizer solution Take 2.5 mg by nebulization 4 (four) times daily as needed for wheezing or shortness of breath.   amoxicillin  (AMOXIL ) 500 MG tablet Take 4 tablets (2,000 mg total) by mouth as directed. 1 hour prior to dental work including cleanings   CALCIUM  CITRATE-VITAMIN D PO Take 2 tablets by mouth in the morning and at bedtime. 600 mg each   carvedilol (COREG) 3.125 MG tablet Take 3.125 mg by mouth 2 (two) times daily.   chlorpheniramine (CHLOR-TRIMETON) 4 MG tablet Take 4 mg by mouth 3 (three) times daily as needed.   cholecalciferol (VITAMIN D) 25 MCG (1000 UNIT) tablet Take 1,000 Units by mouth daily.   clopidogrel  (PLAVIX ) 75 MG tablet Take 75 mg by mouth daily.   denosumab (PROLIA) 60 MG/ML SOSY injection Inject 60 mg into the skin every 6 (six) months. UR approved until 04/43/24   Dextran 70-Hypromellose (ARTIFICIAL TEARS) 0.1-0.3 % SOLN Place 1 drop into both eyes 4 (four) times daily as needed (burning).   docusate sodium  (COLACE) 100 MG capsule Take 100 mg by mouth 2 (two) times daily.   DULoxetine  (CYMBALTA ) 20 MG capsule Take 20 mg by mouth at bedtime.   enzalutamide  (XTANDI ) 40 MG capsule Take 160 mg by mouth daily.   furosemide  (LASIX ) 20 MG tablet Take 1 tablet (20 mg total) by mouth every other day.   gabapentin  (NEURONTIN ) 300 MG capsule Take 300 mg by mouth 3 (three) times daily.   insulin  glargine (LANTUS) 100 UNIT/ML injection Inject 45 Units  into the skin daily.   isosorbide  mononitrate (IMDUR ) 30 MG 24 hr tablet Take 30 mg by mouth daily.   meclizine  (ANTIVERT ) 25 MG tablet Take 25 mg by mouth 3 (three) times daily as needed for dizziness.   nitroGLYCERIN  (NITROSTAT ) 0.4 MG SL tablet Place 1 tablet (0.4 mg total) under the tongue every 5 (five) minutes as needed for chest pain.   OVER THE COUNTER MEDICATION Take 1 capsule by mouth in the morning and at bedtime. FE/B12/C/FA/SA 150-25-20-1-50 mg   pantoprazole  (PROTONIX ) 20 MG tablet Take 1 tablet (20 mg total) by mouth 2 (two) times daily.   pioglitazone (ACTOS) 15 MG tablet Take 15 mg by mouth daily.   PRESCRIPTION MEDICATION Compression Hose Knee LG/LNG-XLNG OT Wear hose topically during the day as directed **Limit two (2) pair initially, then two (2) pair every six (6) Months   rosuvastatin (CRESTOR) 20 MG tablet Take 20 mg by mouth daily.   Skin Protectants, Misc. (MINERIN CREME) CREA Apply 1 application. topically 2 (two) times daily. Apply topically to the affected area a thin layer to alleviate skin changes of anti-neoplastic meds   tamsulosin  (FLOMAX ) 0.4 MG CAPS capsule Take 0.4 mg by mouth 2 (two) times daily.   witch hazel-glycerin (TUCKS) pad Apply 1 application. topically 2 (two) times daily as needed.    Physical Exam:    VS:  BP 120/64 (BP Location: Left Arm, Patient Position: Sitting)   Pulse 67   Ht 5' 8 (1.727 m)   Wt 210 lb 9.6 oz (95.5 kg)   SpO2 95%   BMI 32.02 kg/m    Wt Readings from Last 3 Encounters:  12/31/23 210 lb 9.6 oz (95.5 kg)  04/23/23 199 lb (90.3 kg)  11/27/22 199 lb (90.3 kg)    GEN: Well nourished, well developed in no acute distress NECK: No JVD; No carotid  bruits CARDIAC: RRR, 3/6 holosystolic murmur, no rubs or gallops RESPIRATORY:  Clear to auscultation without rales, wheezing or rhonchi  ABDOMEN: Soft, non-tender, non-distended EXTREMITIES:  No edema; No acute deformity   Asessement and Plan:.    CAD: s/p single-vessel CABG  in 2003 with subsequent PCI to LM to LCx in 03/2018 and PTCA of in-stent restenosis of LM in 07/2018.  Today he denies any chest pain with exertion.  Notes some worsening shortness of breath, he noted some slight improvement with increased Lasix . On carvedilol 3.125 mg twice daily, Plavix  75 mg daily, Lasix  20 mg every other day, Imdur  30 mg daily, Crestor 20 mg daily.   Severe non rheumatic mitral valve regurgitation: Echo 04/23/2023 with severe mitral valve regurgitation with eccentric posterior directed MR jet, previously shown to be flail P2 on TEE.  On chart review it was noted that symptoms would be continued to be monitored before pursuing TEER for severe MR. Today he reports ongoing and progressively worsening dyspnea on exertion.  He notes that he struggles to walk to the med center now, noting increased fatigue and shortness of breath.  He has had some lower extremity edema, improved with Lasix  every other day, no edema appreciated on exam today. He is very worried that his mitral valve regurgitation has worsened.  Given progressive dyspnea on exertion and decreasing exercise tolerance will repeat echo.  Reached out to Dr. Wonda regarding recommendations, following echo will plan to refer back to structural heart clinic.  Severe aortic valve stenosis: s/p TAVR. Echo 04/23/2023 with a EF 50 to 55% with stable valve function with mean gradient at 9 mmHg and mild PVL noted.  Reviewed SBE.  Will continue monitoring with echo as noted above.  HTN: Blood pressure today 120/64.  Continue current antihypertensive regimen.  Dyslipidemia: On rosuvastatin 20 mg daily.  Consider fasting lipid profile on follow-up.  Type 2 diabetes mellitus: No recent A1c on file, patient notes has been well-controlled.  On Lantus, Actos.  LBBB: Stable on ECG, denies palpitations, presyncope or syncope.    Disposition: F/u with Dr. Burnard or APP in 6 months pending echocardiogram results.   Signed, Izyan Ezzell D Shanica Castellanos, NP

## 2023-12-31 ENCOUNTER — Ambulatory Visit: Attending: Cardiology | Admitting: Cardiology

## 2023-12-31 ENCOUNTER — Encounter: Payer: Self-pay | Admitting: Cardiology

## 2023-12-31 VITALS — BP 120/64 | HR 67 | Ht 68.0 in | Wt 210.6 lb

## 2023-12-31 DIAGNOSIS — I10 Essential (primary) hypertension: Secondary | ICD-10-CM

## 2023-12-31 DIAGNOSIS — I350 Nonrheumatic aortic (valve) stenosis: Secondary | ICD-10-CM

## 2023-12-31 DIAGNOSIS — Z951 Presence of aortocoronary bypass graft: Secondary | ICD-10-CM | POA: Diagnosis not present

## 2023-12-31 DIAGNOSIS — R0602 Shortness of breath: Secondary | ICD-10-CM

## 2023-12-31 DIAGNOSIS — R0609 Other forms of dyspnea: Secondary | ICD-10-CM

## 2023-12-31 DIAGNOSIS — E785 Hyperlipidemia, unspecified: Secondary | ICD-10-CM | POA: Diagnosis not present

## 2023-12-31 DIAGNOSIS — Z952 Presence of prosthetic heart valve: Secondary | ICD-10-CM

## 2023-12-31 DIAGNOSIS — I340 Nonrheumatic mitral (valve) insufficiency: Secondary | ICD-10-CM

## 2023-12-31 NOTE — Patient Instructions (Addendum)
 Medication Instructions:  No changes *If you need a refill on your cardiac medications before your next appointment, please call your pharmacy*  Lab Work: No labs If you have labs (blood work) drawn today and your tests are completely normal, you will receive your results only by: MyChart Message (if you have MyChart) OR A paper copy in the mail If you have any lab test that is abnormal or we need to change your treatment, we will call you to review the results.  Testing/Procedures: Your physician has requested that you have an echocardiogram. Echocardiography is a painless test that uses sound waves to create images of your heart. It provides your doctor with information about the size and shape of your heart and how well your heart's chambers and valves are working. This procedure takes approximately one hour. There are no restrictions for this procedure. Please do NOT wear cologne, perfume, aftershave, or lotions (deodorant is allowed). Please arrive 15 minutes prior to your appointment time.  Please note: We ask at that you not bring children with you during ultrasound (echo/ vascular) testing. Due to room size and safety concerns, children are not allowed in the ultrasound rooms during exams. Our front office staff cannot provide observation of children in our lobby area while testing is being conducted. An adult accompanying a patient to their appointment will only be allowed in the ultrasound room at the discretion of the ultrasound technician under special circumstances. We apologize for any inconvenience.  Follow-Up: At [facility], you and your health needs are our priority.  As part of our continuing mission to provide you with exceptional heart care, we have created designated Provider Care Teams.  These Care Teams include your primary Cardiologist (physician) and Advanced Practice Providers (APPs -  Physician Assistants and Nurse Practitioners) who all work together to  provide you with the care you need, when you need it.  We recommend signing up for the patient portal called MyChart.  Sign up information is provided on this After Visit Summary.  MyChart is used to connect with patients for Virtual Visits (Telemedicine).  Patients are able to view lab/test results, encounter notes, upcoming appointments, etc.  Non-urgent messages can be sent to your provider as well.   To learn more about what you can do with MyChart, go to [email or web address].    Your next appointment:   6 Months  Provider:   Any APP

## 2024-03-16 ENCOUNTER — Ambulatory Visit: Admitting: Cardiovascular Disease

## 2024-06-15 NOTE — Telephone Encounter (Addendum)
 Called [facility] and spoke with nurse Levora. Made her aware of message below from Ronal Bring, NP. She will send a message to the provider to get patient started on Ferrous Sulfate.    ----- Message from Ronal Bring, ANP sent at 06/03/2024  5:52 PM EDT ----- Regarding: iron deficiency Hi Can you pls let his facility know that he is iron deficient, and should start an iron supplement?  Ferrous sulfate tab on Monday/Wednesday/Friday Thank you!

## 2024-06-17 ENCOUNTER — Ambulatory Visit: Admitting: Cardiology

## 2024-06-22 NOTE — Progress Notes (Unsigned)
 Cardiology Office Note    Date:  06/24/2024  ID:  Miguel Garcia, DOB 10/22/1946, MRN [id number] PCP:  Patient, No Pcp Per  Cardiologist:  Debby Sor, MD  Electrophysiologist:  None   Chief Complaint: Follow up for CAD and mitral valve regurgitation   History of Present Illness: .    Miguel Garcia is a 78 y.o. male with visit-pertinent history of coronary artery disease with multiple PCI's and single-vessel CABG in 2007 with subsequent PCI to left main to LCx in 03/2018 and PTCA of in-stent restenosis of left main in 07/2018 severe aortic stenosis s/p TAVR in 04/2022, mitral regurgitation, hyperlipidemia, hypertension, prostate cancer, type 2 diabetes mellitus.   Patient with remote history of CABG in 2003.  He then had subsequent PCI with DES to left main to LCx in 03/2018 after presenting with unstable angina and being found to have an abnormal stress test.  He was admitted again in 07/2018 with chest pain, repeat cardiac catheterization showed 80% in-stent restenosis of left main which was treated with PTCA.  In 2021 he was found to have recurrent prostate cancer on PET scan, started on Xtandi  hormone therapy and leuprolide injections.  He had complaints of hematochezia at that time and underwent colonoscopy in 10/2020 which showed multiple colonic angio ectasias with bleeding which were managed with APC.  His Brilinta  was transitioned to Plavix  and atorvastatin  through a statin due to potential drug interactions with his hormone therapy.  An echocardiogram in 03/2021 showed a trileaflet aortic valve with severe thickening and calcification.  Mean gradient was measured at 26 mmHg with a peak gradient of 45 mmHg.  The mitral valve was noted to be mild eczematous with prolapse of the P2 segment with moderate to severe mitral regurgitation with an eccentric anteriorly directed jet.  A TEE was done on 08/23/2021 which showed severe calcification of the aortic valve with a mean gradient of 30 mmHg and a  valve area of 0.7 cm by VTI.  There is severe eccentric mitral valve regurgitation with a flail P2 segment of the posterior leaflet.  There is a small mobile lesion on the atrial surface of the mitral valve suspicious for possible old endocarditis.  There was a pulmonary vein blunting and systolic reversal noted consistent with severe regurgitation.  LVEF was 55 to 60%.  He subsequently underwent cardiac catheterization on 02/01/2022 showing multivessel coronary disease with a patent LIMA to LAD and continued patency of the left main and left circumflex stents with mild to moderate in-stent restenosis.  There was continued patency of the RCA with mild stenosis.  There was severe aortic stenosis with a mean gradient across the valve of 43 mmHg and a peak to peak gradient of 51 mmHg.  Calculated valve area was 0.77 cm.  There was moderate pulmonary hypertension with a PA pressure of 59/22 with a mean of 36 and a large V wave of 24 mmHg consistent with severe mitral regurgitation.  On 04/23/2022 he underwent successful TAVR with a 26 mm Edwards SAPIEN 3 ultra Resilia THV via the TF approach.  Postoperative echo showed EF 55%, normally functioning TAVR with a mean gradient of 5.5 mmHg and no PVL as well as severe MR-eccentric anterior directed MR with splay artifact likely posterior leaflet prolapse.  Following procedure he had a new left bundle branch block, he was discharged with a ZIO AT but this never function correctly on the send back to the company.  1 month TAVR echo showed severe eccentric MR  and TER was briefly discussed although he was felt symptomatically stable at that time.   Patient was last seen in clinic on 12/31/2023, patient reported ongoing and progressively worsening dyspnea on exertion.  He reported that he was struggling to walk to the med center now, noting increased fatigue and shortness of breath.  He reported some lower extremity edema, improved with Lasix  every other day, no edema  appreciated on exam that day.  Reached out to Dr. Wonda regarding recommendations, he recommended echocardiogram with plan to refer back to structural heart clinic.  Echocardiogram has not yet been completed.  Today he presents for follow-up.  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 He reports that he has been doing very well overall.  He denies any chest pain, reports that his shortness of breath seems to have actually improved.  Patient reports that he is now able to walk 100 yards with only minimal shortness of breath.  He denies any lower extremity edema, orthopnea or PND.  Patient reports that he has been taking Brilinta  at [facility], reviewed with patient that per our medication list and his [facility] oncologist medication list he is to be on Plavix , patient insist that he is on Brilinta  and would prefer to resume Plavix , aside from this he has no cardiac concerns or complaints today.  Labwork independently reviewed: 06/03/2024: Hemoglobin 11.2, hematocrit 32.5, sodium 146, potassium 4.3, creatinine 1.10, AST 20, ALT 15 ROS: .   Today he denies chest pain, lower extremity edema, fatigue, palpitations, melena, hematuria, hemoptysis, diaphoresis, weakness, presyncope, syncope, orthopnea, and PND.  All other systems are reviewed and otherwise negative. Studies Reviewed: SABRA   EKG: Patient deferred EKG today. CV Studies: Cardiac studies reviewed are outlined and summarized above. Otherwise please see EMR for full report. Cardiac Studies & Procedures   ______________________________________________________________________________________________ CARDIAC CATHETERIZATION  CARDIAC CATHETERIZATION 02/01/2022  Conclusion 1.  Multivessel coronary artery disease with continued patency of the LIMA to LAD graft, continued patency of the left main and left circumflex stents with mild to moderate in-stent restenosis, and continued patency of the RCA with mild stenosis noted. 2.  Severe aortic stenosis with a mean  transvalvular gradient of 43 mmHg, peak to peak gradient of 51 mmHg, and calculated aortic valve area of 0.77 cm 3.  Moderate pulmonary hypertension with PA pressure of 59/22, mean of 36, large V wave of 24 mmHg consistent with significant mitral regurgitation  Recommendations: Complex patient.  Coronary anatomy is stable and plan medical therapy for CAD.  Patient with severe multivalve disease with aortic stenosis and mitral regurgitation.  We will review with multidisciplinary heart valve team, but favor TAVR followed by transcatheter mitral valve repair if needed.  Patient will need CTA studies and valve clinic follow-up.  Findings Coronary Findings Diagnostic  Dominance: Right  Left Main Mid LM to Dist LM lesion is 40% stenosed. The lesion was previously treated . Previously treated with stenting and balloon angioplasty for in-stent restenosis.  Left Anterior Descending Ost LAD to Prox LAD lesion is 100% stenosed. Prox LAD to Mid LAD lesion is 10% stenosed. The lesion was previously treated .  First Diagonal Branch 1st Diag lesion is 50% stenosed.  Left Circumflex The circumflex is extensively stented all the way back to the left main.  The left main/circumflex interface has been previously stented and then redilated for treatment of in-stent restenosis.  There is 40 to 50% restenosis in that area.  The proximal circumflex is patent to the obtuse marginal which has been  treated with balloon angioplasty of the ostium is patent with moderate ostial stenosis.  The mid and distal circumflex beyond the OM have mild diffuse in-stent restenosis extending into a small terminal OM branch which is diffusely diseased. Non-stenotic Ost Cx to Prox Cx lesion was previously treated. Non-stenotic Prox Cx lesion was previously treated. Prox Cx to Mid Cx lesion is 30% stenosed. The lesion was previously treated . Diffuse mild in-stent restenosis noted Non-stenotic Mid Cx to Dist Cx lesion was previously  treated.  Second Obtuse Marginal Branch Ost 2nd Mrg lesion is 50% stenosed. The lesion was previously treated .  Third Obtuse Marginal Branch Ost 3rd Mrg lesion is 40% stenosed.  Right Coronary Artery Prox RCA lesion is 50% stenosed. Lesion appears nonobstructive and moderate at most in the 40 to 50% range. Mid RCA lesion is 50% stenosed. The lesion is moderately calcified.  LIMA Graft To Mid LAD The LIMA to LAD graft remains widely patent.  Intervention  No interventions have been documented.   CARDIAC CATHETERIZATION  CARDIAC CATHETERIZATION 08/17/2018  Conclusion  Ost LAD to Prox LAD lesion is 100% stenosed.  1st Diag lesion is 50% stenosed.  Prox LAD to Mid LAD lesion is 10% stenosed.  Ost 3rd Mrg lesion is 40% stenosed.  Previously placed Ost Cx to Prox Cx stent (unknown type) is widely patent.  Previously placed Prox Cx stent (unknown type) is widely patent.  Prox RCA lesion is 35% stenosed.  Mid RCA lesion is 30% stenosed.  Mid LM to Dist LM lesion is 80% stenosed.  Prox Cx to Mid Cx lesion is 15% stenosed.  A stent was successfully placed.  Post intervention, there is a 0% residual stenosis.  Mid Cx to Dist Cx lesion is 90% stenosed.  Post intervention, there is a 0% residual stenosis.  Ost 2nd Mrg lesion is 85% stenosed.  Post intervention, there is a 10% residual stenosis.  Post intervention, there is a 50% residual stenosis.  Significant of coronary obstructive disease with 80% in-stent restenosis in the distal left main stent.  Old total ostial occlusion of the LAD.  Patent proximal left circumflex stent with 15% intimal hyperplasia in the mid circumflex stent followed by eccentric 90% stenosis distal to the mid circumflex stent and 85% ostial stenosis in the OM1 vessel which is jailed by stent overlap of the proximal and mid  previously placed stents.  Dominant RCA with 35% smooth stenosis proximally and 30% stenosis in the mid  segment.  Right and left internal mammary artery supplying the mid LAD with 50% narrowing in the diagonal vessel and patent proximal LAD stent.  Successful multilesion intervention with Cutting Balloon/PTCA of the distal left main stent with the 80% stenosis being reduced to less than 15%; successful stenting of the distal circumflex with insertion of a 2.0 x 12 mm Resolute DES stent postdilated to 2.25 mm with a 90% stenosis being reduced to 0%, and PTCA of the ostium of the OM1 vessel eccentric 85% stenosis reduced  to 50%.  RECOMMENDATION: Medical therapy for concomitant CAD.  High potency statin therapy with target LDL less than 70.  Optimal blood pressure control.  Recommend follow-up 2D echo Doppler study to reassess LV function. Recommend dual antiplatelet therapy with ASA 81 mg and Ticagrelor  90mg  twice daily long-term (beyond 12 months) because of multiple stents and left main involvement in this patient status post prior CABG revascularization.  Findings Coronary Findings Diagnostic  Dominance: Right  Left Main Mid LM to Dist LM lesion  is 80% stenosed. The lesion was previously treated.  Left Anterior Descending Ost LAD to Prox LAD lesion is 100% stenosed. Prox LAD to Mid LAD lesion is 10% stenosed. The lesion was previously treated.  First Diagonal Branch 1st Diag lesion is 50% stenosed.  Left Circumflex Previously placed Ost Cx to Prox Cx stent (unknown type) is widely patent. Previously placed Prox Cx stent (unknown type) is widely patent. Prox Cx to Mid Cx lesion is 15% stenosed. The lesion was previously treated. Mid Cx to Dist Cx lesion is 90% stenosed.  Second Obtuse Marginal Branch Ost 2nd Mrg lesion is 85% stenosed.  Third Obtuse Marginal Branch Ost 3rd Mrg lesion is 40% stenosed.  Right Coronary Artery Prox RCA lesion is 35% stenosed. Mid RCA lesion is 30% stenosed.  LIMA Graft To Mid LAD  Intervention  Mid LM to Dist LM  lesion Angioplasty Post-Intervention Lesion Assessment The intervention was successful. Pre-interventional TIMI flow is 3. Post-intervention TIMI flow is 3. No complications occurred at this lesion. There is a 10% residual stenosis post intervention.  Prox Cx to Mid Cx lesion Stent (Also treats lesions: Mid Cx to Dist Cx) A stent was successfully placed. Post-Intervention Lesion Assessment The intervention was successful. Intentional subintimal strategy was used. Pre-interventional TIMI flow is 3. Post-intervention TIMI flow is 3. No complications occurred at this lesion. There is a 0% residual stenosis post intervention.  Mid Cx to Dist Cx lesion Stent (Also treats lesions: Prox Cx to Mid Cx) See details in Prox Cx to Mid Cx lesion. Post-Intervention Lesion Assessment The intervention was successful. Pre-interventional TIMI flow is 3. Post-intervention TIMI flow is 3. There is a 0% residual stenosis post intervention.  Ost 2nd Mrg lesion Angioplasty Post-Intervention Lesion Assessment The intervention was successful. Pre-interventional TIMI flow is 3. Post-intervention TIMI flow is 3. No complications occurred at this lesion. There is a 50% residual stenosis post intervention.     ECHOCARDIOGRAM  ECHOCARDIOGRAM COMPLETE 06/24/2024  Narrative ECHOCARDIOGRAM REPORT    Patient Name:   Miguel Garcia  Date of Exam: 06/24/2024 Medical Rec #:  [id number]       Height:       68.0 in Accession #:    [id number]      Weight:       210.6 lb Date of Birth:  Jan 10, 1946       BSA:          2.089 m Patient Age:    78 years        BP:           120/64 mmHg Patient Gender: M               HR:           68 bpm. Exam Location:  [address]  Procedure: 2D Echo, 3D Echo, Cardiac Doppler, Color Doppler and Strain Analysis (Both Spectral and Color Flow Doppler were utilized during procedure).  Indications:    R06.02 SOB; R60.0 Lower extremity edema; I34.0 Nonrheumatic mitral (valve)  insufficiency  History:        Patient has prior history of Echocardiogram examinations, most recent 04/23/2023. CAD, Prior CABG and 04/23/2022 26 mm Edwards SAPIEN 3 Ultra Resilia THV, Arrythmias:LBBB, Signs/Symptoms:Edema and Shortness of Breath; Risk Factors:Hypertension, Diabetes, Non-Smoker and Dyslipidemia. Patient denies chest pain. He does have intermittent SOB with leg edema.  Sonographer:    Annabella Cater RVT, RDCS (AE), RDMS Referring Phys: [id number] Jaclene Bartelt D Lorece Keach  IMPRESSIONS   1. Left ventricular ejection fraction,  by estimation, is 60 to 65%. The left ventricle has normal function. The left ventricle has no regional wall motion abnormalities. There is moderate concentric left ventricular hypertrophy. 2. Right ventricular systolic function is normal. The right ventricular size is normal. 3. Left atrial size was severely dilated. 4. MR is extremely eccentric directed anteriorly along wall of LA.(previously shown to be flail P2 segement by TEE). MR wraps around periphery of atrium.. Severe mitral valve regurgitation. 5. S/p TAVR (26 mm Edwards SAPIEN 3 Ultra Resilia; procedure date 04/23/22). Mean gradient through the valve is 7 mm Hg AVA (VTI) 1.94 cm2. DVI is 0.7 Compared to echo report from May 2024, mean gradient is relatively unchanged (0 to 7 mm Hg). Trivial perivalvular AI.SABRA The aortic valve has been repaired/replaced. Aortic valve regurgitation is not visualized.  Comparison(s): EF 50%, severe MR, TAVR mean 9.0 peak 17.1 mmHg.  FINDINGS Left Ventricle: Left ventricular ejection fraction, by estimation, is 60 to 65%. The left ventricle has normal function. The left ventricle has no regional wall motion abnormalities. Global longitudinal strain performed but not reported based on interpreter judgement due to suboptimal tracking. The left ventricular internal cavity size was normal in size. There is moderate concentric left ventricular hypertrophy.  Right Ventricle: The right  ventricular size is normal. Right vetricular wall thickness was not assessed. Right ventricular systolic function is normal.  Left Atrium: Left atrial size was severely dilated.  Right Atrium: Right atrial size was normal in size.  Pericardium: There is no evidence of pericardial effusion.  Mitral Valve: MR is extremely eccentric directed anteriorly along wall of LA.(previously shown to be flail P2 segement by TEE). MR wraps around periphery of atrium. There is moderate thickening of the mitral valve leaflet(s). Mild mitral annular calcification. Severe mitral valve regurgitation. MV peak gradient, 74.6 mmHg.  Tricuspid Valve: The tricuspid valve is normal in structure. Tricuspid valve regurgitation is mild.  Aortic Valve: S/p TAVR (26 mm Edwards SAPIEN 3 Ultra Resilia; procedure date 04/23/22). Mean gradient through the valve is 7 mm Hg AVA (VTI) 1.94 cm2. DVI is 0.7 Compared to echo report from May 2024, mean gradient is relatively unchanged (0 to 7 mm Hg). Trivial perivalvular AI. The aortic valve has been repaired/replaced. Aortic valve regurgitation is not visualized. Aortic valve mean gradient measures 7.3 mmHg. Aortic valve peak gradient measures 13.6 mmHg. Aortic valve area, by VTI measures 1.94 cm.  Pulmonic Valve: The pulmonic valve was grossly normal. Pulmonic valve regurgitation is trivial.  Aorta: The aortic root and ascending aorta are structurally normal, with no evidence of dilitation.  IAS/Shunts: No atrial level shunt detected by color flow Doppler.   LEFT VENTRICLE PLAX 2D LVIDd:         5.05 cm   Diastology LVIDs:         3.16 cm   LV e' medial:    4.05 cm/s LV PW:         1.42 cm   LV E/e' medial:  27.9 LV IVS:        1.53 cm   LV e' lateral:   9.51 cm/s LVOT diam:     1.88 cm   LV E/e' lateral: 11.9 LV SV:         78 LV SV Index:   38 LVOT Area:     2.76 cm  3D Volume EF: 3D EF:        60 % LV EDV:       166 ml LV ESV:  67 ml LV SV:        99  ml  RIGHT VENTRICLE RV S prime:     10.00 cm/s TAPSE (M-mode): 1.9 cm  LEFT ATRIUM              Index        RIGHT ATRIUM           Index LA diam:        5.27 cm  2.52 cm/m   RA Area:     17.10 cm LA Vol (A2C):   127.0 ml 60.79 ml/m  RA Volume:   38.30 ml  18.33 ml/m LA Vol (A4C):   134.0 ml 64.14 ml/m LA Biplane Vol: 135.0 ml 64.62 ml/m AORTIC VALVE                     PULMONIC VALVE AV Area (Vmax):    1.90 cm      PV Vmax:       0.95 m/s AV Area (Vmean):   1.81 cm      PV Peak grad:  3.6 mmHg AV Area (VTI):     1.94 cm AV Vmax:           184.33 cm/s AV Vmean:          127.000 cm/s AV VTI:            0.404 m AV Peak Grad:      13.6 mmHg AV Mean Grad:      7.3 mmHg LVOT Vmax:         127.00 cm/s LVOT Vmean:        83.100 cm/s LVOT VTI:          0.284 m LVOT/AV VTI ratio: 0.70  AORTA Ao Root diam: 2.92 cm Ao Asc diam:  3.77 cm Ao Arch diam: 3.5 cm  MITRAL VALVE                TRICUSPID VALVE MV Area (PHT): 3.53 cm     TR Peak grad:   30.0 mmHg MV Peak grad:  74.6 mmHg    TR Vmax:        274.00 cm/s MV Vmax:       4.32 m/s MV Decel Time: 215 msec     SHUNTS MV E velocity: 113.00 cm/s  Systemic VTI:  0.28 m MV A velocity: 89.40 cm/s   Systemic Diam: 1.88 cm MV E/A ratio:  1.26  Vina Gull MD Electronically signed by Vina Gull MD Signature Date/Time: 06/24/2024/6:14:51 PM    Final   TEE  ECHO TEE 08/23/2021  Narrative TRANSESOPHOGEAL ECHO REPORT    Patient Name:   Miguel Garcia Date of Exam: 08/23/2021 Medical Rec #:  [id number]      Height:       69.0 in Accession #:    [id number]     Weight:       193.0 lb Date of Birth:  1946/09/25      BSA:          2.035 m Patient Age:    75 years       BP:           165/91 mmHg Patient Gender: M              HR:           81 bpm. Exam Location:  Inpatient  Procedure: 3D Echo, Transesophageal Echo, Cardiac Doppler, Color Doppler and Saline Contrast Bubble Study  MODIFIED REPORT: This report was modified by  Soyla Merck MD on 08/24/2021 due to resend to EMR, no changes made. Indications:     I34.0 Nonrheumatic mitral (valve) insufficiency; I34.1 Nonrheumatic mitral (valve) prolapse; I35.0 Nonrheumatic aortic (valve) stenosis  History:         Patient has prior history of Echocardiogram examinations. CAD, Prior CABG, Mitral Valve Disease and Aortic Valve Disease; Risk Factors:Hypertension, Diabetes and Dyslipidemia. Mitral regurgitation. Aortic stenosis.  Sonographer:     Ellouise Mose RDCS Referring Phys:  SOYLA DELENA MERCK Diagnosing Phys: Soyla Merck MD  PROCEDURE: After discussion of the risks and benefits of a TEE, an informed consent was obtained from the patient. The transesophogeal probe was passed without difficulty through the esophogus of the patient. Imaged were obtained with the patient in a left lateral decubitus position. Sedation performed by different physician. The patient was monitored while under deep sedation. Anesthestetic sedation was provided intravenously by Anesthesiology: 367.5mg  of Propofol . The patient's vital signs; including heart rate, blood pressure, and oxygen saturation; remained stable throughout the procedure. The patient developed no complications during the procedure.  IMPRESSIONS   1. Left ventricular ejection fraction, by estimation, is 55 to 60%. The left ventricle has normal function. The left ventricle demonstrates regional wall motion abnormalities (see scoring diagram/findings for description). 2. Right ventricular systolic function is normal. The right ventricular size is normal. 3. Left atrial size was severely dilated. No left atrial/left atrial appendage thrombus was detected. The LAA emptying velocity was 61 cm/s. 4. Right atrial size was mildly dilated. 5. Severe, eccentric mitral valve regurgitation. There is a flail segment on the posterior mitral leaflet on the P2 scallop with degenerative change/calcification. There is also a small  independently mobile lesion on the atrial surface of the mitral valve. Cannot exclude degenerative change from prior endocarditis (best seen in clip 68). Severe and eccentric anteriorly direct jet of mitral valve regurgitation. PV blunting and systolic reversal noted. The mitral valve is abnormal. Severe mitral valve regurgitation. No evidence of mitral stenosis. 6. The aortic valve is abnormal. There is severe calcifcation of the aortic valve. Aortic valve regurgitation is mild. Moderate to severe aortic valve stenosis. Aortic valve area, by VTI measures 0.70 cm. Aortic valve mean gradient measures 30.5 mmHg. Aortic valve Vmax measures 3.55 m/s. 7. Cannot exclude small PFO with left to right shunt by color flow.. Agitated saline contrast bubble study was negative, with no evidence of right to left shunt. 8. There is Moderate (Grade III) atheroma plaque involving the transverse and descending aorta.  Conclusion(s)/Recommendation(s): Consider inflammatory markers and blood cultures to exclude endocarditis.  FINDINGS Left Ventricle: Left ventricular ejection fraction, by estimation, is 55 to 60%. The left ventricle has normal function. The left ventricle demonstrates regional wall motion abnormalities. The left ventricular internal cavity size was normal in size.   LV Wall Scoring: The antero-lateral wall is hypokinetic.  Right Ventricle: The right ventricular size is normal. No increase in right ventricular wall thickness. Right ventricular systolic function is normal.  Left Atrium: Left atrial size was severely dilated. No left atrial/left atrial appendage thrombus was detected. The LAA emptying velocity was 61 cm/s.  Right Atrium: Right atrial size was mildly dilated.  Pericardium: There is no evidence of pericardial effusion.  Mitral Valve: Severe, eccentric mitral valve regurgitation. There is a flail segment on the posterior mitral leaflet on the P2 scallop with degenerative  change/calcification. There is also a small independently mobile lesion on the atrial surface of the mitral  valve. Cannot exclude degenerative change from prior endocarditis (best seen in clip 68). Severe and eccentric anteriorly direct jet of mitral valve regurgitation. PV blunting and systolic reversal noted. The mitral valve is abnormal. Severe mitral valve regurgitation. No evidence of mitral valve stenosis.  Tricuspid Valve: The tricuspid valve is grossly normal. Tricuspid valve regurgitation is mild.  Aortic Valve: The aortic valve is abnormal. There is severe calcifcation of the aortic valve. Aortic valve regurgitation is mild. Moderate to severe aortic stenosis is present. Aortic valve mean gradient measures 30.5 mmHg. Aortic valve peak gradient measures 50.4 mmHg. Aortic valve area, by VTI measures 0.70 cm.  Pulmonic Valve: The pulmonic valve was normal in structure. Pulmonic valve regurgitation is trivial.  Aorta: The aortic root and ascending aorta are structurally normal, with no evidence of dilitation. There is moderate (Grade III) atheroma plaque involving the transverse and descending aorta.  IAS/Shunts: Cannot exclude small PFO with left to right shunt by color flow. Agitated saline contrast was given intravenously to evaluate for intracardiac shunting. Agitated saline contrast bubble study was negative, with no evidence of any interatrial shunt.   LEFT VENTRICLE PLAX 2D LVOT diam:     2.10 cm LV SV:         57 LV SV Index:   28 LVOT Area:     3.46 cm   AORTIC VALVE AV Area (Vmax):    0.73 cm AV Area (Vmean):   0.69 cm AV Area (VTI):     0.70 cm AV Vmax:           355.00 cm/s AV Vmean:          262.000 cm/s AV VTI:            0.812 m AV Peak Grad:      50.4 mmHg AV Mean Grad:      30.5 mmHg LVOT Vmax:         74.86 cm/s LVOT Vmean:        52.164 cm/s LVOT VTI:          0.164 m LVOT/AV VTI ratio: 0.20  AORTA Ao Root diam: 3.30 cm Ao Asc diam:  3.50  cm  MR Peak grad:    154.4 mmHg MR Mean grad:    81.0 mmHg   SHUNTS MR Vmax:         621.26 cm/s Systemic VTI:  0.16 m MR Vmean:        442.9 cm/s  Systemic Diam: 2.10 cm MR PISA Nyquist: 3.9 m/s MR PISA:         9.05 cm MR PISA Eff ROA: 56 mm MR PISA Radius:  1.20 cm  Soyla Merck MD Electronically signed by Soyla Merck MD Signature Date/Time: 08/23/2021/11:59:38 AM    Final (Updated)  MONITORS  LONG TERM MONITOR-LIVE TELEMETRY (3-14 DAYS) 06/07/2022  Narrative Patch Wear Time:  8 days and 2 hours (2023-05-03T11:29:40-0400 to 2023-05-11T14:16:57-0400)  Patient had a min HR of 49 bpm, max HR of 179 bpm, and avg HR of 85 bpm. Predominant underlying rhythm was Sinus Rhythm. Bundle Branch Block/IVCD was present. QRS morphology changes were present throughout recording. 1 run of Ventricular Tachycardia occurred lasting 13 beats with a max rate of 179 bpm (avg 118 bpm). 5 Supraventricular Tachycardia runs occurred, the run with the fastest interval lasting 4 beats with a max rate of 148 bpm, the longest lasting 17 beats with an avg rate of 111 bpm. Second Degree AV Block-Mobitz I (Wenckebach) was present. Isolated SVEs were  rare (<1.0%), SVE Couplets were rare (<1.0%), and SVE Triplets were rare (<1.0%). Isolated VEs were rare (<1.0%), VE Couplets were rare (<1.0%), and no VE Triplets were present. Ventricular Bigeminy and Trigeminy were present.  Zio patch monitor demonstrated predominant sinus rhythm with an average heart rate 85 bpm.  Patient had a 13 beat episode of ventricular tachycardia with a rate from 86 to 179 bpm average 118, which occurred on Apr 26, 2022 at 10:26 PM.  There was a brief episode of second-degree Mobitz type I block on May 6 at 3:17 AM.  There were 5 episodes of SVT with the fastest at a maximum rate of 148 bpm lasting 4 beats and the longest at 17 beats with an average rate at 111 bpm.  There was rare isolated ectopy with brief ventricular bigeminy and  trigeminy.   CT SCANS  CT CORONARY MORPH W/CTA COR W/SCORE 02/27/2022  Addendum 02/28/2022  5:03 AM ADDENDUM REPORT: 02/28/2022 05:01  ADDENDUM: Extracardiac findings were described separately under dictation for contemporaneously obtained CTA chest, abdomen and pelvis. Please see separate dictation for contemporaneously obtained CTA chest, abdomen and pelvis 02/27/2022 for full description of relevant extracardiac findings.   Electronically Signed By: Toribio Aye M.D. On: 02/28/2022 05:01  Narrative CLINICAL DATA:  Aortic Stenosis  EXAM: Cardiac TAVR CT  TECHNIQUE: The patient was scanned on a Siemens Force 192 slice scanner. A 120 kV retrospective scan was triggered in the ascending thoracic aorta at 140 HU's. Gantry rotation speed was 250 msecs and collimation was .6 mm. No beta blockade or nitro were given. The 3D data set was reconstructed in 5% intervals of the R-R cycle. Systolic and diastolic phases were analyzed on a dedicated work station using MPR, MIP and VRT modes. The patient received 80 cc of contrast.  FINDINGS: Aortic Valve: Calcified tri leaflet AV with score 3751  Aorta: No aneurysm mild calcific atherosclerosis Bovine arch with tortuous right subclavian and carotid  Sino-tubular Junction: 28 mm  Ascending Thoracic Aorta: 34 mm  Descending Thoracic Aorta: 27 mm  Sinus of Valsalva Measurements:  Non-coronary: 32.9 mm  Right - coronary: 29 mm  Left -   coronary: 30.3 mm  Coronary Artery Height above Annulus:  Left Main: 11.7 mm above annulus  Right Coronary: 16.5 mm above annulus  Virtual Basal Annulus Measurements:  Maximum / Minimum Diameter: 28.2 mm x 20.8 mm  Perimeter: 78.3 mm  Area: 503 mm 2  Coronary Arteries: Sufficient height above annulus for deployment with patent LIMA to LAD  Optimum Fluoroscopic Angle for Delivery: LAO 3 Caudal 5 degrees  IMPRESSION: 1 Calcified tri leaflet AV with score 3751  2.  Optimum  angiographic angle for deployment LAO 3 Caudal 5 degrees  3. Coronary arteries sufficient height above annulus for deployment with patent LIMA to LAD  4.  Membranous septal length 11.6 mm  5.  Annular area of 503 mm2 suitable for a 26 mm Sapien 3 valve  Maude Emmer  Electronically Signed: By: Maude Emmer M.D. On: 02/27/2022 12:29     ______________________________________________________________________________________________       Current Reported Medications:.    Current Meds  Medication Sig   acetaminophen  (TYLENOL ) 325 MG tablet Take 650 mg by mouth 3 (three) times daily as needed for moderate pain.   albuterol  (PROVENTIL  HFA;VENTOLIN  HFA) 108 (90 Base) MCG/ACT inhaler Inhale 2 puffs into the lungs 4 (four) times daily as needed for wheezing or shortness of breath.   albuterol  (PROVENTIL ) (2.5 MG/3ML) 0.083%  nebulizer solution Take 2.5 mg by nebulization 4 (four) times daily as needed for wheezing or shortness of breath.   amoxicillin  (AMOXIL ) 500 MG tablet Take 4 tablets (2,000 mg total) by mouth as directed. 1 hour prior to dental work including cleanings   aspirin  EC 81 MG tablet Take 81 mg by mouth daily. Swallow whole.   CALCIUM  CITRATE-VITAMIN D PO Take 2 tablets by mouth in the morning and at bedtime. 600 mg each   carvedilol (COREG) 3.125 MG tablet Take 3.125 mg by mouth 2 (two) times daily.   chlorpheniramine (CHLOR-TRIMETON) 4 MG tablet Take 4 mg by mouth 3 (three) times daily as needed.   cholecalciferol (VITAMIN D) 25 MCG (1000 UNIT) tablet Take 1,000 Units by mouth daily.   clopidogrel  (PLAVIX ) 75 MG tablet Take 75 mg by mouth daily.   denosumab (PROLIA) 60 MG/ML SOSY injection Inject 60 mg into the skin every 6 (six) months. UR approved until 04/43/24   Dextran 70-Hypromellose (ARTIFICIAL TEARS) 0.1-0.3 % SOLN Place 1 drop into both eyes 4 (four) times daily as needed (burning).   docusate sodium  (COLACE) 100 MG capsule Take 100 mg by mouth 2 (two) times  daily.   DULoxetine  (CYMBALTA ) 20 MG capsule Take 20 mg by mouth at bedtime.   enzalutamide  (XTANDI ) 40 MG capsule Take 160 mg by mouth daily.   furosemide  (LASIX ) 20 MG tablet Take 1 tablet (20 mg total) by mouth every other day. (Patient taking differently: Take 20 mg by mouth daily.)   gabapentin  (NEURONTIN ) 300 MG capsule Take 300 mg by mouth 3 (three) times daily.   insulin  glargine (LANTUS) 100 UNIT/ML injection Inject 45 Units into the skin daily.   isosorbide  mononitrate (IMDUR ) 30 MG 24 hr tablet Take 30 mg by mouth daily.   meclizine  (ANTIVERT ) 25 MG tablet Take 25 mg by mouth 3 (three) times daily as needed for dizziness.   nitroGLYCERIN  (NITROSTAT ) 0.4 MG SL tablet Place 1 tablet (0.4 mg total) under the tongue every 5 (five) minutes as needed for chest pain.   OVER THE COUNTER MEDICATION Take 1 capsule by mouth in the morning and at bedtime. FE/B12/C/FA/SA 150-25-20-1-50 mg   pantoprazole  (PROTONIX ) 20 MG tablet Take 1 tablet (20 mg total) by mouth 2 (two) times daily.   pioglitazone (ACTOS) 15 MG tablet Take 15 mg by mouth daily.   PRESCRIPTION MEDICATION Compression Hose Knee LG/LNG-XLNG OT Wear hose topically during the day as directed **Limit two (2) pair initially, then two (2) pair every six (6) Months   rosuvastatin (CRESTOR) 20 MG tablet Take 20 mg by mouth daily.   Skin Protectants, Misc. (MINERIN CREME) CREA Apply 1 application. topically 2 (two) times daily. Apply topically to the affected area a thin layer to alleviate skin changes of anti-neoplastic meds   tamsulosin  (FLOMAX ) 0.4 MG CAPS capsule Take 0.4 mg by mouth 2 (two) times daily.   ticagrelor  (BRILINTA ) 90 MG TABS tablet Take 90 mg by mouth.   witch hazel-glycerin (TUCKS) pad Apply 1 application. topically 2 (two) times daily as needed.    Physical Exam:    VS:  BP 134/72   Pulse 65   Ht 5' 10 (1.778 m)   Wt 204 lb 12.8 oz (92.9 kg)   SpO2 98%   BMI 29.39 kg/m    Wt Readings from Last 3 Encounters:   06/24/24 204 lb 12.8 oz (92.9 kg)  12/31/23 210 lb 9.6 oz (95.5 kg)  04/23/23 199 lb (90.3 kg)  GEN: Well nourished, well developed in no acute distress NECK: No JVD; No carotid bruits CARDIAC: RRR, 2/6 systolic murmur, no rubs or gallops RESPIRATORY:  Clear to auscultation without rales, wheezing or rhonchi  ABDOMEN: Soft, non-tender, non-distended EXTREMITIES:  No edema; No acute deformity     Asessement and Plan:.    CAD: s/p single-vessel CABG in 2003 with subsequent PCI to LM to LCx in 03/2018 and PTCA of in-stent restenosis of LM in 07/2018. Stable with no anginal symptoms. No indication for ischemic evaluation.  Heart healthy diet and regular cardiovascular exercise encouraged.  Patient reports that he has been given Brilinta  at [facility], reports that he is unable to tolerate this.  Discussed with patient that per our medication list and he of his [facility] oncologist medication list he is currently on Plavix  not Brilinta .  Patient insists that he has been on Brilinta , note provided with patients AVS to confirm that patient has been maintained on Plavix .  Continue carvedilol 3.125 mg twice daily, Plavix  75 mg daily, Lasix  20 mg every other day, Imdur  30 mg daily, Crestor 20 mg daily.  Severe non rheumatic mitral vale regurgitation: Echo 04/23/2023 with severe mitral valve regurgitation with eccentric posterior directed MR jet, previously shown to be flail P2 on TEE.  On chart review it was noted that symptoms would be continued to be monitored before pursuing TEER for severe MR. Patient underwent echocardiogram this morning, prior to appointment, unfortunately results are not yet available.  Patient stated his understanding.  He reports that he actually has been feeling improved in recent months, reports that his shortness of breath has improved.  Discussed referral back to structural heart with patient, patient reports that if his echo results have worsened he is agreeable to following up with structural  heart clinic however he notes if stable and unchanged he would rather wait.  Encouraged patient to monitor for increased shortness of breath, lower extremity edema or weight gain.  Continue Lasix  20 mg every other day.  Severe aortic valve stenosis: s/p TAVR. Echo Echo 04/23/2023 with a EF 50 to 55% with stable valve function with mean gradient at 9 mmHg and mild PVL noted.  Reviewed SBE.   HTN: Blood pressure today 134/72, patient reports that his blood pressure is typically very well-controlled at [facility], averaging with systolics in the 120s.  Dyslipidemia: Patient without a recent lipid profile available for review.  Discussed checking lab work today, patient deferred.  He reports that his cholesterol has been checked by [facility] and is well-controlled.  Left bundle branch block: Patient with known history of left bundle branch block, denies any palpitations, dizziness, lightheadedness, presyncope or syncope.  Discussed EKG today, patient deferred.    Disposition: F/u with Dr. Swaziland in six months to establish care.   Signed, Bronislaus Verdell D Aariz Maish, NP

## 2024-06-24 ENCOUNTER — Ambulatory Visit (HOSPITAL_COMMUNITY)
Admission: RE | Admit: 2024-06-24 | Discharge: 2024-06-24 | Disposition: A | Discharge status: Home / Self Care | Source: Ambulatory Visit | Attending: Cardiology | Admitting: Cardiology

## 2024-06-24 ENCOUNTER — Ambulatory Visit: Attending: Cardiology | Admitting: Cardiology

## 2024-06-24 ENCOUNTER — Encounter: Payer: Self-pay | Admitting: Cardiology

## 2024-06-24 VITALS — BP 134/72 | HR 65 | Ht 70.0 in | Wt 204.8 lb

## 2024-06-24 DIAGNOSIS — R0609 Other forms of dyspnea: Secondary | ICD-10-CM

## 2024-06-24 DIAGNOSIS — Z951 Presence of aortocoronary bypass graft: Secondary | ICD-10-CM | POA: Insufficient documentation

## 2024-06-24 DIAGNOSIS — E785 Hyperlipidemia, unspecified: Secondary | ICD-10-CM

## 2024-06-24 DIAGNOSIS — Z952 Presence of prosthetic heart valve: Secondary | ICD-10-CM

## 2024-06-24 DIAGNOSIS — R0602 Shortness of breath: Secondary | ICD-10-CM

## 2024-06-24 DIAGNOSIS — I10 Essential (primary) hypertension: Secondary | ICD-10-CM

## 2024-06-24 DIAGNOSIS — I340 Nonrheumatic mitral (valve) insufficiency: Secondary | ICD-10-CM

## 2024-06-24 LAB — ECHOCARDIOGRAM COMPLETE
AR max vel: 1.9 cm2
AV Area VTI: 1.94 cm2
AV Area mean vel: 1.81 cm2
AV Mean grad: 7.3 mmHg
AV Peak grad: 13.6 mmHg
Ao pk vel: 1.84 m/s
Area-P 1/2: 3.53 cm2
S' Lateral: 3.16 cm

## 2024-06-24 NOTE — Patient Instructions (Signed)
 Medication Instructions:  Please Ensure patient take Plavix  75 mg once a day Patient should no longer be on brilinta  *If you need a refill on your cardiac medications before your next appointment, please call your pharmacy*  Lab Work: No labs  Testing/Procedures: No testing  Follow-Up: At [facility], you and your health needs are our priority.  As part of our continuing mission to provide you with exceptional heart care, our providers are all part of one team.  This team includes your primary Cardiologist (physician) and Advanced Practice Providers or APPs (Physician Assistants and Nurse Practitioners) who all work together to provide you with the care you need, when you need it.  Your next appointment:   5-6 month(s)  Provider:   Peter Swaziland MD  We recommend signing up for the patient portal called MyChart.  Sign up information is provided on this After Visit Summary.  MyChart is used to connect with patients for Virtual Visits (Telemedicine).  Patients are able to view lab/test results, encounter notes, upcoming appointments, etc.  Non-urgent messages can be sent to your provider as well.   To learn more about what you can do with MyChart, go to [email or web address].

## 2024-06-30 ENCOUNTER — Ambulatory Visit: Payer: Self-pay | Admitting: Cardiology

## 2024-07-01 NOTE — Telephone Encounter (Signed)
-----   Message from Katlyn D West sent at 06/30/2024  1:54 PM EDT ----- Please let [name] know that his echocardiogram showed normal heart squeeze and function. There is thickening of the left lower chamber of the heart, this is common with history of  hypertension, will need to work towards good blood pressure control. His mitral valve regurgitation is severe. Aortic valve s/p TAVR is stable. Overall good results, at office visit patient reported  he was doing very well with minimal DOE and was walking farther. Reviewed echo with Dr. Wonda, he noted as patient was stable, if not improved would not recommend referral back to structural heart  team at this time. If he has new or worsened shortness of breath, chest pain or activity intolerance please have them let us  know and he will be seen back sooner. Otherwise follow up as planned.  ----- Message ----- From: Interface, [employer] Sent: 06/24/2024   6:14 PM EDT To: Katlyn D West, NP

## 2024-07-01 NOTE — Telephone Encounter (Signed)
No way to leave message

## 2024-07-07 NOTE — Telephone Encounter (Signed)
-----   Message from Katlyn D West sent at 06/30/2024  1:54 PM EDT ----- Please let [name] know that his echocardiogram showed normal heart squeeze and function. There is thickening of the left lower chamber of the heart, this is common with history of  hypertension, will need to work towards good blood pressure control. His mitral valve regurgitation is severe. Aortic valve s/p TAVR is stable. Overall good results, at office visit patient reported  he was doing very well with minimal DOE and was walking farther. Reviewed echo with Dr. Wonda, he noted as patient was stable, if not improved would not recommend referral back to structural heart  team at this time. If he has new or worsened shortness of breath, chest pain or activity intolerance please have them let us  know and he will be seen back sooner. Otherwise follow up as planned.  ----- Message ----- From: Interface, [employer] Sent: 06/24/2024   6:14 PM EDT To: Katlyn D West, NP

## 2024-07-07 NOTE — Telephone Encounter (Signed)
 Called patient facility advised of below to nurse they verbalized understanding Facility is going to need fax with he report itself in there. Facility is currently out of ink and will need it faxed for Monday Fax: number is [phone]

## 2025-01-10 NOTE — Progress Notes (Unsigned)
 "  Cardiology Office Note    Date:  01/10/2025  ID:  Miguel Garcia, DOB Apr 03, 1946, MRN [id number] PCP:  Patient, No Pcp Per  Cardiologist:  Debby Sor, MD (Inactive)  Electrophysiologist:  None   Chief Complaint: Follow up for CAD and mitral valve regurgitation   History of Present Illness: .    Miguel Garcia is a 79 y.o. male with visit-pertinent history of coronary artery disease with multiple PCI's and single-vessel CABG in 2007 with subsequent PCI to left main to LCx in 03/2018 and PTCA of in-stent restenosis of left main in 07/2018,  severe aortic stenosis s/p TAVR in 04/2022, mitral regurgitation, hyperlipidemia, hypertension, prostate cancer, type 2 diabetes mellitus. He is a former patient of Dr Sor.   Patient with remote history of CABG in 2003.  He then had subsequent PCI with DES to left main to LCx in 03/2018 after presenting with unstable angina and being found to have an abnormal stress test.  He was admitted again in 07/2018 with chest pain, repeat cardiac catheterization showed 80% in-stent restenosis of left main which was treated with PTCA.  In 2021 he was found to have recurrent prostate cancer on PET scan, started on Xtandi  hormone therapy and leuprolide injections.  He had complaints of hematochezia at that time and underwent colonoscopy in 10/2020 which showed multiple colonic angio ectasias with bleeding which were managed with APC.  His Brilinta  was transitioned to Plavix  and atorvastatin  due to potential drug interactions with his hormone therapy.  An echocardiogram in 03/2021 showed a trileaflet aortic valve with severe thickening and calcification.  Mean gradient was measured at 26 mmHg with a peak gradient of 45 mmHg.  The mitral valve was noted to be mild eczematous with prolapse of the P2 segment with moderate to severe mitral regurgitation with an eccentric anteriorly directed jet.  A TEE was done on 08/23/2021 which showed severe calcification of the aortic valve with a  mean gradient of 30 mmHg and a valve area of 0.7 cm by VTI.  There is severe eccentric mitral valve regurgitation with a flail P2 segment of the posterior leaflet.  There is a small mobile lesion on the atrial surface of the mitral valve suspicious for possible old endocarditis.  There was a pulmonary vein blunting and systolic reversal noted consistent with severe regurgitation.  LVEF was 55 to 60%.  He subsequently underwent cardiac catheterization on 02/01/2022 showing multivessel coronary disease with a patent LIMA to LAD and continued patency of the left main and left circumflex stents with mild to moderate in-stent restenosis.  There was continued patency of the RCA with mild stenosis.  There was severe aortic stenosis with a mean gradient across the valve of 43 mmHg and a peak to peak gradient of 51 mmHg.  Calculated valve area was 0.77 cm.  There was moderate pulmonary hypertension with a PA pressure of 59/22 with a mean of 36 and a large V wave of 24 mmHg consistent with severe mitral regurgitation.  On 04/23/2022 he underwent successful TAVR with a 26 mm Edwards SAPIEN 3 ultra Resilia THV via the TF approach.  Postoperative echo showed EF 55%, normally functioning TAVR with a mean gradient of 5.5 mmHg and no PVL as well as severe MR-eccentric anterior directed MR with splay artifact likely posterior leaflet prolapse.  Following procedure he had a new left bundle branch block, he was discharged with a ZIO AT but this never function correctly on the send back to the company.  1 month TAVR echo showed severe eccentric MR and TER was briefly discussed although he was felt symptomatically stable at that time.   Patient was last seen in clinic on 12/31/2023, patient reported ongoing and progressively worsening dyspnea on exertion.  He reported that he was struggling to walk to the med center now, noting increased fatigue and shortness of breath.  He reported some lower extremity edema, improved with Lasix  every  other day, no edema appreciated on exam that day.    When last seen in July his dyspnea was actually much better - able to walk 100 yards with minimal SOB. Echo repeated showing normal EF, normal functioning AV and again severe MR. Since he was symptomatically doing well no further intervention pursued.   ROS: .   Today he denies chest pain, lower extremity edema, fatigue, palpitations, melena, hematuria, hemoptysis, diaphoresis, weakness, presyncope, syncope, orthopnea, and PND.  All other systems are reviewed and otherwise negative. Studies Reviewed: SABRA   EKG: Patient deferred EKG today. CV Studies: Cardiac studies reviewed are outlined and summarized above. Otherwise please see EMR for full report. Cardiac cath 02/01/22:   Conclusion  1.  Multivessel coronary artery disease with continued patency of the LIMA to LAD graft, continued patency of the left main and left circumflex stents with mild to moderate in-stent restenosis, and continued patency of the RCA with mild stenosis noted. 2.  Severe aortic stenosis with a mean transvalvular gradient of 43 mmHg, peak to peak gradient of 51 mmHg, and calculated aortic valve area of 0.77 cm 3.  Moderate pulmonary hypertension with PA pressure of 59/22, mean of 36, large V wave of 24 mmHg consistent with significant mitral regurgitation   Recommendations: Complex patient.  Coronary anatomy is stable and plan medical therapy for CAD.  Patient with severe multivalve disease with aortic stenosis and mitral regurgitation.  We will review with multidisciplinary heart valve team, but favor TAVR followed by transcatheter mitral valve repair if needed.  Patient will need CTA studies and valve clinic follow-up.  Coronary Diagrams  Diagnostic Dominance: Right  Intervention  Echo 06/24/24: IMPRESSIONS     1. Left ventricular ejection fraction, by estimation, is 60 to 65%. The  left ventricle has normal function. The left ventricle has no regional  wall  motion abnormalities. There is moderate concentric left ventricular  hypertrophy.   2. Right ventricular systolic function is normal. The right ventricular  size is normal.   3. Left atrial size was severely dilated.   4. MR is extremely eccentric directed anteriorly along wall of  LA.(previously shown to be flail P2 segement by TEE). MR wraps around  periphery of atrium.. Severe mitral valve regurgitation.   5. S/p TAVR (26 mm Edwards SAPIEN 3 Ultra Resilia; procedure date  04/23/22). Mean gradient through the valve is 7 mm Hg AVA (VTI) 1.94 cm2.  DVI is 0.7 Compared to echo report from May 2024, mean gradient is  relatively unchanged (0 to 7 mm Hg). Trivial  perivalvular AI.SABRA The aortic valve has been repaired/replaced. Aortic  valve regurgitation is not visualized.   Comparison(s): EF 50%, severe MR, TAVR mean 9.0 peak 17.1 mmHg.   Current Reported Medications:.    No outpatient medications have been marked as taking for the 01/13/25 encounter (Appointment) with Boaz Berisha M, MD.    Physical Exam:    VS:  There were no vitals taken for this visit.   Wt Readings from Last 3 Encounters:  06/24/24 204 lb 12.8 oz (92.9  kg)  12/31/23 210 lb 9.6 oz (95.5 kg)  04/23/23 199 lb (90.3 kg)    GEN: Well nourished, well developed in no acute distress NECK: No JVD; No carotid bruits CARDIAC: RRR, 2/6 systolic murmur, no rubs or gallops RESPIRATORY:  Clear to auscultation without rales, wheezing or rhonchi  ABDOMEN: Soft, non-tender, non-distended EXTREMITIES:  No edema; No acute deformity     Asessement and Plan:.    CAD: s/p single-vessel CABG in 2003 with subsequent PCI to LM to LCx in 03/2018 and PTCA of in-stent restenosis of LM in 07/2018. Cardiac cath in 2023 showed patent LIMA to LAD and patent stents in left main and LCx.  Stable with no anginal symptoms. No indication for ischemic evaluation.  Heart healthy diet and regular cardiovascular exercise encouraged.  Patient reports that  he has been given Brilinta  at [facility], reports that he is unable to tolerate this.  Discussed with patient that per our medication list and he of his [facility] oncologist medication list he is currently on Plavix  not Brilinta .  Patient insists that he has been on Brilinta , note provided with patients AVS to confirm that patient has been maintained on Plavix .  Continue carvedilol 3.125 mg twice daily, Plavix  75 mg daily, Lasix  20 mg every other day, Imdur  30 mg daily, Crestor 20 mg daily.  Severe non rheumatic mitral vale regurgitation: Echo 04/23/2023 with severe mitral valve regurgitation with eccentric posterior directed MR jet, previously shown to be flail P2 on TEE.  On chart review it was noted that symptoms would be continued to be monitored before pursuing TEER for severe MR. Patient underwent echocardiogram this morning, prior to appointment, unfortunately results are not yet available.  Patient stated his understanding.  He reports that he actually has been feeling improved in recent months, reports that his shortness of breath has improved.  Discussed referral back to structural heart with patient, patient reports that if his echo results have worsened he is agreeable to following up with structural heart clinic however he notes if stable and unchanged he would rather wait.  Encouraged patient to monitor for increased shortness of breath, lower extremity edema or weight gain.  Continue Lasix  20 mg every other day.  Severe aortic valve stenosis: s/p TAVR. Echo Echo 06/24/24  with normal EF with stable valve function with mean gradient at 9 mmHg and mild PVL noted.  Reviewed SBE.   HTN: Blood pressure today 134/72, patient reports that his blood pressure is typically very well-controlled at [facility], averaging with systolics in the 120s.  Dyslipidemia: Patient without a recent lipid profile available for review.  Discussed checking lab work today, patient deferred.  He reports that his cholesterol has been checked by  [facility] and is well-controlled.  Left bundle branch block: Patient with known history of left bundle branch block, denies any palpitations, dizziness, lightheadedness, presyncope or syncope.  Discussed EKG today, patient deferred.    Disposition: F/u with Dr. Kaveon Blatz in six months to establish care.   Signed, Sayge Brienza, MD   "

## 2025-01-13 ENCOUNTER — Ambulatory Visit: Admitting: Cardiology

## 2025-02-22 ENCOUNTER — Ambulatory Visit: Admitting: Cardiology
